# Patient Record
Sex: Male | Born: 1960 | Race: White | Hispanic: No | Marital: Married | Smoking: Never smoker
Health system: Southern US, Community
[De-identification: ages and names within clinical notes are randomized; demographics above are authoritative.]

## PROBLEM LIST (undated history)

## (undated) DIAGNOSIS — Z9289 Personal history of other medical treatment: Secondary | ICD-10-CM

## (undated) DIAGNOSIS — I10 Essential (primary) hypertension: Secondary | ICD-10-CM

## (undated) DIAGNOSIS — K219 Gastro-esophageal reflux disease without esophagitis: Secondary | ICD-10-CM

## (undated) DIAGNOSIS — E785 Hyperlipidemia, unspecified: Secondary | ICD-10-CM

## (undated) DIAGNOSIS — R112 Nausea with vomiting, unspecified: Secondary | ICD-10-CM

## (undated) DIAGNOSIS — Z9889 Other specified postprocedural states: Secondary | ICD-10-CM

## (undated) DIAGNOSIS — G4730 Sleep apnea, unspecified: Secondary | ICD-10-CM

## (undated) DIAGNOSIS — N189 Chronic kidney disease, unspecified: Secondary | ICD-10-CM

## (undated) DIAGNOSIS — E119 Type 2 diabetes mellitus without complications: Secondary | ICD-10-CM

## (undated) HISTORY — PX: VARICOCELE EXCISION: SUR582

## (undated) HISTORY — DX: Hyperlipidemia, unspecified: E78.5

## (undated) HISTORY — PX: APPENDECTOMY: SHX54

## (undated) HISTORY — PX: COLONOSCOPY: SHX174

## (undated) HISTORY — PX: WISDOM TOOTH EXTRACTION: SHX21

## (undated) HISTORY — DX: Gastro-esophageal reflux disease without esophagitis: K21.9

## (undated) HISTORY — DX: Type 2 diabetes mellitus without complications: E11.9

## (undated) HISTORY — DX: Sleep apnea, unspecified: G47.30

## (undated) HISTORY — PX: NASAL SEPTUM SURGERY: SHX37

## (undated) HISTORY — DX: Other specified postprocedural states: R11.2

## (undated) HISTORY — DX: Chronic kidney disease, unspecified: N18.9

## (undated) HISTORY — DX: Other specified postprocedural states: Z98.890

## (undated) HISTORY — DX: Personal history of other medical treatment: Z92.89

## (undated) HISTORY — DX: Essential (primary) hypertension: I10

---

## 1998-05-03 ENCOUNTER — Ambulatory Visit (HOSPITAL_COMMUNITY): Admission: RE | Admit: 1998-05-03 | Discharge: 1998-05-03 | Payer: Self-pay | Admitting: *Deleted

## 2003-01-03 ENCOUNTER — Encounter: Admission: RE | Admit: 2003-01-03 | Discharge: 2003-01-03 | Payer: Self-pay | Admitting: Internal Medicine

## 2003-01-03 ENCOUNTER — Encounter: Payer: Self-pay | Admitting: Internal Medicine

## 2004-07-05 ENCOUNTER — Ambulatory Visit (HOSPITAL_BASED_OUTPATIENT_CLINIC_OR_DEPARTMENT_OTHER): Admission: RE | Admit: 2004-07-05 | Discharge: 2004-07-05 | Payer: Self-pay | Admitting: Internal Medicine

## 2004-12-14 ENCOUNTER — Ambulatory Visit: Payer: Self-pay | Admitting: Internal Medicine

## 2005-12-12 ENCOUNTER — Ambulatory Visit: Payer: Self-pay | Admitting: Internal Medicine

## 2008-11-03 ENCOUNTER — Ambulatory Visit: Payer: Self-pay | Admitting: Internal Medicine

## 2009-05-02 ENCOUNTER — Ambulatory Visit: Payer: Self-pay | Admitting: Internal Medicine

## 2009-08-08 ENCOUNTER — Ambulatory Visit: Payer: Self-pay | Admitting: Internal Medicine

## 2009-10-31 ENCOUNTER — Ambulatory Visit: Payer: Self-pay | Admitting: Internal Medicine

## 2010-05-03 ENCOUNTER — Ambulatory Visit: Payer: Self-pay | Admitting: Internal Medicine

## 2011-01-11 NOTE — Procedures (Signed)
NAME:  Lance Beard, Lance Beard                 ACCOUNT NO.:  [id number]   MEDICAL RECORD NO.:  [id number]          PATIENT TYPE:  OUT   LOCATION:  SLEEP CENTER                 FACILITY:  [facility]   PHYSICIAN:  Clinton D. Maple Hudson, M.D. DATE OF BIRTH:  05/12/1961   DATE OF STUDY:  07/05/2004                              NOCTURNAL POLYSOMNOGRAM   REFERRING PHYSICIAN:  Clinton D. Maple Hudson, M.D.   INDICATION FOR STUDY AND HISTORY:  Hypersomnia with sleep apnea.   Epworth sleepiness score 7/24, neck size 16.5 inches, BMI 30, weight 235  pounds.   SLEEP ARCHITECTURE:  Total sleep time 357 minutes with sleep efficiency of  79%.  Stage I was 27%; stage II, 57%; Stages III and IV 6%.  REM was 11% of  total sleep time.  Latency to sleep onset 10 minutes, latency to REM 161  minutes.  Awake after sleep onset 80 minutes.  Arousal index elevated at  66.4.   RESPIRATORY DATA:  Split study protocol.  RDI 18.5 per hour indicating  moderate obstructive sleep apnea/hypopnea syndrome before CPAP.  This  included 22 obstructive apneas and 38 hypopneas before CPAP.  Events were  not positional.  REM RDI 18.9.  CPAP was titrated to 11 CWP, RDI 0 per hour  using a medium ResMed Ultra Mirage nasal/oral mask with heated humidifier.   OXYGEN DATA:  Very loud snoring with oxygen desaturation to a nadir of 85%  before CPAP.  After CPAP titration, saturation held 97% on room air.   CARDIAC DATA:  Normal sinus rhythm.   MOVEMENT AND PARASOMNIA:  A total of 70 limb jerks were recorded of which 15  were associated with arousal or awakening for a periodic limb movement with  arousal index of 2.5 per hour which is slightly increased.   IMPRESSION AND RECOMMENDATIONS:  1.  Moderate obstructive sleep apnea/hypopnea syndrome, RDI 18.5 per hour      with desaturation to 85%.  2.  CPAP titration to 11 CWP, RDI 0 per hour, using a medium  ResMed Ultra      Mirage nasal-oral mask with heated humidifier.  3.  Periodic limb movement with  arousal.   Clinton D. Maple Hudson, M.D.  Diplomate, Biomedical engineer of Sleep Medicine                                                           Clinton D. Maple Hudson, M.D.  Diplomate, American Board   CDY/MEDQ  D:  07/15/2004 12:24:30  T:  07/15/2004 16:57:37  Job:  [id number]

## 2011-05-20 ENCOUNTER — Encounter: Payer: Self-pay | Admitting: Internal Medicine

## 2011-05-21 ENCOUNTER — Other Ambulatory Visit: Payer: 59 | Admitting: Internal Medicine

## 2011-05-21 DIAGNOSIS — Z0000 Encounter for general adult medical examination without abnormal findings: Secondary | ICD-10-CM

## 2011-05-21 LAB — BASIC METABOLIC PANEL
BUN: 16 mg/dL (ref 6–23)
Calcium: 9.1 mg/dL (ref 8.4–10.5)
Creat: 1.11 mg/dL (ref 0.50–1.35)
Glucose, Bld: 93 mg/dL (ref 70–99)
Potassium: 4.7 mEq/L (ref 3.5–5.3)

## 2011-05-21 LAB — HEPATIC FUNCTION PANEL
ALT: 23 U/L (ref 0–53)
AST: 17 U/L (ref 0–37)
Alkaline Phosphatase: 28 U/L — ABNORMAL LOW (ref 39–117)
Bilirubin, Direct: 0.2 mg/dL (ref 0.0–0.3)
Total Bilirubin: 0.8 mg/dL (ref 0.3–1.2)

## 2011-05-21 LAB — CBC WITH DIFFERENTIAL/PLATELET
Basophils Absolute: 0 10*3/uL (ref 0.0–0.1)
Basophils Relative: 0 % (ref 0–1)
Eosinophils Absolute: 0.1 10*3/uL (ref 0.0–0.7)
Eosinophils Relative: 1 % (ref 0–5)
HCT: 44.9 % (ref 39.0–52.0)
Hemoglobin: 14.5 g/dL (ref 13.0–17.0)
Lymphocytes Relative: 32 % (ref 12–46)
MCH: 27.3 pg (ref 26.0–34.0)
MCHC: 32.3 g/dL (ref 30.0–36.0)
MCV: 84.4 fL (ref 78.0–100.0)
Monocytes Absolute: 0.5 10*3/uL (ref 0.1–1.0)
Monocytes Relative: 7 % (ref 3–12)
Platelets: 218 10*3/uL (ref 150–400)
RDW: 15.3 % (ref 11.5–15.5)
WBC: 6.2 10*3/uL (ref 4.0–10.5)

## 2011-05-21 LAB — LIPID PANEL: Cholesterol: 103 mg/dL (ref 0–200)

## 2011-05-23 ENCOUNTER — Ambulatory Visit (INDEPENDENT_AMBULATORY_CARE_PROVIDER_SITE_OTHER): Payer: 59 | Admitting: Internal Medicine

## 2011-05-23 ENCOUNTER — Encounter: Payer: Self-pay | Admitting: Internal Medicine

## 2011-05-23 VITALS — BP 148/78 | HR 80 | Temp 98.8°F | Ht 73.5 in | Wt 254.0 lb

## 2011-05-23 DIAGNOSIS — Z0000 Encounter for general adult medical examination without abnormal findings: Secondary | ICD-10-CM

## 2011-05-23 DIAGNOSIS — E781 Pure hyperglyceridemia: Secondary | ICD-10-CM

## 2011-05-23 LAB — POCT URINALYSIS DIPSTICK
Ketones, UA: NEGATIVE
Leukocytes, UA: NEGATIVE
Protein, UA: NEGATIVE
Urobilinogen, UA: NEGATIVE
pH, UA: 6

## 2011-05-23 NOTE — Progress Notes (Signed)
  Subjective:    Patient ID: Lance Beard, male    DOB: [birth year], 50 y.o.   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 History of hypertriglyceridemia. Is on Lipitor 10 mg daily. History of mild obstructive sleep apnea and his CPAP machine. Fractured nose in the remote past playing sports 1981. No known drug allergies. Appendectomy 1981, surgery for left varicocele 1991, deviated nasal septum repair 1984. Tetanus immunization October 2003.  His adopted daughter went off to college recently. 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 His father had a stroke in his been quite deal. At one point it was thought father who has multiple medical problems might not live but he improved in his family at home but this is been very stressful for Lance Beard. His blood pressure is elevated today. Nonsmoker. No history of issues with blood pressure in the past.  Father with history of coronary artery disease, chronic kidney disease, CVA, diabetes. One sister in good health.   Patient is married. Wife is a Warden/ranger in the public school system. Patient does not smoke or consume alcohol. One adopted daughter.    Review of Systems  Constitutional: Negative.   HENT: Negative.   Eyes: Negative.   Respiratory: Negative.   Cardiovascular: Negative.   Gastrointestinal: Negative.   Genitourinary: Negative.   Musculoskeletal: Negative.   Neurological: Negative.   Hematological: Negative.   Psychiatric/Behavioral: Negative.        Objective:   Physical Exam  Vitals reviewed. Constitutional: He is oriented to person, place, and time. No distress.  HENT:  Head: Normocephalic and atraumatic.  Right Ear: External ear normal.  Left Ear: External ear normal.  Mouth/Throat: Oropharynx is clear and moist.  Eyes: Conjunctivae and EOM are normal. Pupils are equal, round, and reactive to light.  Neck: Normal range of  motion. Neck supple. No JVD present. No thyromegaly present.  Cardiovascular: Normal rate, regular rhythm, normal heart sounds and intact distal pulses.   No murmur heard. Pulmonary/Chest: Effort normal and breath sounds normal. He has no wheezes.  Abdominal: Soft. Bowel sounds are normal. He exhibits no distension and no mass. There is no rebound and no guarding.  Genitourinary: Prostate normal.  Musculoskeletal: Normal range of motion. He exhibits no edema.  Lymphadenopathy:    He has no cervical adenopathy.  Neurological: He is alert and oriented to person, place, and time. He has normal reflexes. No cranial nerve deficit. Coordination normal.  Skin: Skin is warm and dry. He is not diaphoretic.  Psychiatric: He has a normal mood and affect.          Assessment & Plan:  History of hypertriglyceridemia maintained on Lipitor 10 mg daily. Recent lipid panel shows triglycerides of 190 and total cholesterol of 100 for  In September 2011 triglycerides were 180. Prior to starting medication triglycerides were 320 in August 2003. Has been on Lipitor since December 2004.  Suspect labile hypertension. Family history of hypertension in his father along with cardiovascular disease. Would like for patient to return and have repeat blood pressure check in 6-8 weeks. [facility] about every 8 weeks. Offered home blood pressure monitor prescription but he declines at this point in time. I rechecked his blood pressure today and got 140/80. Watch salt, try to exercise and reassess in 8 weeks

## 2011-07-16 ENCOUNTER — Other Ambulatory Visit: Payer: Self-pay | Admitting: Internal Medicine

## 2011-07-17 ENCOUNTER — Other Ambulatory Visit: Payer: Self-pay | Admitting: Internal Medicine

## 2011-07-25 ENCOUNTER — Encounter: Payer: Self-pay | Admitting: Internal Medicine

## 2011-07-25 ENCOUNTER — Ambulatory Visit (INDEPENDENT_AMBULATORY_CARE_PROVIDER_SITE_OTHER): Payer: 59 | Admitting: Internal Medicine

## 2011-07-25 VITALS — BP 140/82 | HR 84 | Temp 98.3°F | Wt 258.0 lb

## 2011-07-25 DIAGNOSIS — R030 Elevated blood-pressure reading, without diagnosis of hypertension: Secondary | ICD-10-CM

## 2011-07-25 DIAGNOSIS — E781 Pure hyperglyceridemia: Secondary | ICD-10-CM

## 2011-07-25 NOTE — Patient Instructions (Signed)
Try walking for exercise. Watch salt intake. Recheck March 2013 with fasting lipid panel liver functions blood pressure check and office visit

## 2011-07-25 NOTE — Progress Notes (Signed)
  Subjective:    Patient ID: Lance Beard, male    DOB: 1960/12/09, 50 y.o.   MRN: [id number]  HPI 50 year old white male with history of hypertriglyceridemia on Lipitor with blood pressure elevation noted on physical exam September 2012. Blood pressure today initially was elevated. He came in from work and was a bit rushed. When I rechecked it I got 140/90 with large cuff right arm and 130/80 left arm large cuff. He has not been able to check it at home and has not donated blood recently where he would have it checked. We will continue to watch it but with family history in his father hypertension and coronary artery disease, it is likely that he will wind up on antihypertensive medication. He has an appointment for six-month recheck March 2013 and we will followup at that time.  Of note, his blood pressure should always be checked with a large cuff because he has fairly large muscular arm.    Review of Systems     Objective:   Physical Exam chest clear; cardiac exam regular rate and rhythm        Assessment & Plan:  Borderline blood pressure elevation Hypertriglyceridemia  Plan: Return March 2013 for fasting lipid panel liver functions blood pressure check and office visit. He did get influenza immunization.

## 2011-11-18 ENCOUNTER — Ambulatory Visit (INDEPENDENT_AMBULATORY_CARE_PROVIDER_SITE_OTHER): Payer: 59 | Admitting: Internal Medicine

## 2011-11-18 ENCOUNTER — Encounter: Payer: Self-pay | Admitting: Internal Medicine

## 2011-11-18 VITALS — BP 124/76 | HR 76 | Temp 98.2°F | Ht 73.75 in | Wt 260.0 lb

## 2011-11-18 DIAGNOSIS — Z0000 Encounter for general adult medical examination without abnormal findings: Secondary | ICD-10-CM

## 2011-11-18 DIAGNOSIS — E785 Hyperlipidemia, unspecified: Secondary | ICD-10-CM

## 2011-11-18 DIAGNOSIS — Z23 Encounter for immunization: Secondary | ICD-10-CM

## 2011-11-18 DIAGNOSIS — Z8042 Family history of malignant neoplasm of prostate: Secondary | ICD-10-CM

## 2011-11-18 LAB — CBC WITH DIFFERENTIAL/PLATELET
Basophils Relative: 0 % (ref 0–1)
Eosinophils Absolute: 0.1 10*3/uL (ref 0.0–0.7)
HCT: 48.1 % (ref 39.0–52.0)
Hemoglobin: 15.3 g/dL (ref 13.0–17.0)
MCH: 27.4 pg (ref 26.0–34.0)
MCHC: 31.8 g/dL (ref 30.0–36.0)
Monocytes Absolute: 0.5 10*3/uL (ref 0.1–1.0)
Monocytes Relative: 7 % (ref 3–12)

## 2011-11-18 LAB — LIPID PANEL
HDL: 25 mg/dL — ABNORMAL LOW (ref >39)
LDL Cholesterol: 42 mg/dL (ref 0–99)
Triglycerides: 162 mg/dL — ABNORMAL HIGH (ref <150)
VLDL: 32 mg/dL (ref 0–40)

## 2011-11-18 LAB — COMPREHENSIVE METABOLIC PANEL
Albumin: 4.4 g/dL (ref 3.5–5.2)
Alkaline Phosphatase: 30 U/L — ABNORMAL LOW (ref 39–117)
BUN: 12 mg/dL (ref 6–23)
Glucose, Bld: 88 mg/dL (ref 70–99)
Total Bilirubin: 0.7 mg/dL (ref 0.3–1.2)

## 2011-11-18 LAB — POCT URINALYSIS DIPSTICK
Blood, UA: NEGATIVE
Glucose, UA: NEGATIVE
Ketones, UA: NEGATIVE
Spec Grav, UA: 1.01
Urobilinogen, UA: NEGATIVE

## 2011-11-22 ENCOUNTER — Encounter: Payer: Self-pay | Admitting: Internal Medicine

## 2011-11-22 NOTE — Progress Notes (Signed)
  Subjective:    Patient ID: Lance Beard, male    DOB: 04-Apr-1961, 51 y.o.   MRN: [id number]  HPI 51 year old white male with hyperlipidemia in today for health maintenance exam. Family history of coronary artery disease in his father. EKG performed today is within normal limits.    Review of Systems     Objective:   Physical Examneck no JVD or thyromegaly; chest clear to auscultation; cardiac exam: regular rate and rhythm normal S1 and S2; abdomen: no hepatosplenomegaly masses or tenderness. No bruits. Extremities without edema. Pulses in feet are normal.prostate normal.        Assessment & Plan:  Hyperlipidemia  Family history of coronary artery disease in his father  Plan: Return in 6 months for fasting lipid panel liver functions and office visit.

## 2011-11-22 NOTE — Patient Instructions (Signed)
Continue Lipitor as prescribed. Return in 6 months.

## 2012-05-21 ENCOUNTER — Other Ambulatory Visit: Payer: 59 | Admitting: Internal Medicine

## 2012-05-21 DIAGNOSIS — E785 Hyperlipidemia, unspecified: Secondary | ICD-10-CM

## 2012-05-21 DIAGNOSIS — Z79899 Other long term (current) drug therapy: Secondary | ICD-10-CM

## 2012-05-21 LAB — LIPID PANEL
HDL: 23 mg/dL — ABNORMAL LOW (ref >39)
LDL Cholesterol: 30 mg/dL (ref 0–99)
Total CHOL/HDL Ratio: 4.2 Ratio
Triglycerides: 214 mg/dL — ABNORMAL HIGH (ref <150)
VLDL: 43 mg/dL — ABNORMAL HIGH (ref 0–40)

## 2012-05-21 LAB — HEPATIC FUNCTION PANEL
Albumin: 4.2 g/dL (ref 3.5–5.2)
Indirect Bilirubin: 0.5 mg/dL (ref 0.0–0.9)
Total Protein: 6.4 g/dL (ref 6.0–8.3)

## 2012-05-25 ENCOUNTER — Ambulatory Visit: Payer: 59 | Admitting: Internal Medicine

## 2012-05-28 ENCOUNTER — Encounter: Payer: Self-pay | Admitting: Internal Medicine

## 2012-05-28 ENCOUNTER — Ambulatory Visit (INDEPENDENT_AMBULATORY_CARE_PROVIDER_SITE_OTHER): Payer: 59 | Admitting: Internal Medicine

## 2012-05-28 VITALS — BP 128/68 | HR 76 | Temp 98.0°F | Wt 262.0 lb

## 2012-05-28 DIAGNOSIS — E781 Pure hyperglyceridemia: Secondary | ICD-10-CM

## 2012-05-28 NOTE — Patient Instructions (Addendum)
Increase Lipitor from 10-20 mg daily return in 6 months.

## 2012-05-28 NOTE — Progress Notes (Signed)
  Subjective:    Patient ID: Lance Beard, male    DOB: Jun 16, 1961, 51 y.o.   MRN: [id number]  HPI 51 year old White male for follow up of hyperlipidemia. History of hypertriglyceridemia maintained on Lipitor 10 mg daily. Currently taking one half of a 20 mg tablet daily. He is compliant with his medication. However, we have seen an increase in triglycerides from last visit from the 160 range to 214. Says he doesn't always make good food choices but tries to eat healthy.  Says he will get influenza immunization at work.  We have discussed colonoscopy previously and he has declined having it so far.  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 He takes Advil for shoulder pain. 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    Review of Systems     Objective:   Physical Exam chest clear to auscultation. Cardiac exam regular rate and rhythm. Extremities without edema.        Assessment & Plan:  Hypertriglyceridemia  Plan: Increase Lipitor from 10-20 mg daily. New prescription for generic Lipitor 20 mg (#90) one by mouth daily with when necessary 1 year refill. Schedule physical examination in 6 months with fasting lab work.

## 2012-09-11 ENCOUNTER — Ambulatory Visit (INDEPENDENT_AMBULATORY_CARE_PROVIDER_SITE_OTHER): Payer: 59 | Admitting: Internal Medicine

## 2012-09-11 ENCOUNTER — Encounter: Payer: Self-pay | Admitting: Internal Medicine

## 2012-09-11 ENCOUNTER — Ambulatory Visit
Admission: RE | Admit: 2012-09-11 | Discharge: 2012-09-11 | Disposition: A | Discharge status: Home / Self Care | Payer: 59 | Source: Ambulatory Visit | Attending: Internal Medicine | Admitting: Internal Medicine

## 2012-09-11 VITALS — BP 128/76 | HR 76 | Temp 98.3°F | Wt 265.0 lb

## 2012-09-11 DIAGNOSIS — R0602 Shortness of breath: Secondary | ICD-10-CM

## 2012-09-11 NOTE — Patient Instructions (Addendum)
   We will arrange for cardiology consultation. Go to the ER if symptoms worsen

## 2012-09-11 NOTE — Progress Notes (Addendum)
  Subjective:    Patient ID: Lance Beard, male    DOB: 19-Oct-1960, 52 y.o.   MRN: [id number]  HPI 52 year old white male who has strong family history of coronary artery disease in his father has developed some discomfort in his left arm. He recently went to the [employer] to donate blood in his blood pressure was elevated that day. No prior history of hypertension. No frank chest pain-- just some left arm pain. Doesn't recall any overexertion that would have resulted in strain of his left arm. Says he maybe overreacting to this symptom. History of hyperlipidemia on statin therapy. Nonsmoker.    Review of Systems     Objective:   Physical Exam Skin is warm and dry. Nodes none. Neck is supple without thyromegaly JVD or carotid bruits. Chest clear to auscultation. Cardiac exam: regular rate and rhythm normal S1 and S2 without murmurs. Extremities without edema. EKG shows no acute changes. Normal sinus rhythm.        Assessment & Plan:   Left arm pain in 52 year old male with hyperlipidemia with strong family history of coronary artery disease  Isolated episode of elevated blood pressure at [employer]  Plan: Patient seems anxious about these symptoms so we'll refer to cardiology for further evaluation.   Chest x-ray ordered

## 2012-09-17 ENCOUNTER — Telehealth: Payer: Self-pay

## 2012-09-17 DIAGNOSIS — M79603 Pain in arm, unspecified: Secondary | ICD-10-CM

## 2012-09-17 NOTE — Telephone Encounter (Signed)
Patient scheduled for an appointment with Dr. Ellin Goodie cardiology  On 01/24/12014 at 9:00 am. He is aware of this appt.

## 2012-09-18 ENCOUNTER — Encounter: Payer: Self-pay | Admitting: Cardiology

## 2012-09-18 ENCOUNTER — Ambulatory Visit (INDEPENDENT_AMBULATORY_CARE_PROVIDER_SITE_OTHER): Payer: 59 | Admitting: Cardiology

## 2012-09-18 VITALS — BP 140/76 | HR 87 | Ht 74.0 in | Wt 264.0 lb

## 2012-09-18 DIAGNOSIS — R079 Chest pain, unspecified: Secondary | ICD-10-CM

## 2012-09-18 DIAGNOSIS — E781 Pure hyperglyceridemia: Secondary | ICD-10-CM

## 2012-09-18 NOTE — Progress Notes (Signed)
  HPI: 52 year old male for evaluation of chest pain. No prior cardiac history. Chest x-ray 09/11/2012 showed mild peribronchial thickening. Patient notes occasional left shoulder and left upper extremity discomfort. It is not exertional. No associated symptoms. Occurs when "he thinks about it". He has dyspnea with more extreme activities but not routine activities. No orthopnea, PND, pedal edema or syncope. No claudication. Because of his symptoms we were asked to evaluate.  Current Outpatient Prescriptions  Medication Sig Dispense Refill  . aspirin 81 MG tablet Take 81 mg by mouth daily.        Marland Kitchen atorvastatin (LIPITOR) 20 MG tablet         No Known Allergies  Past Medical History  Diagnosis Date  . Hyperlipidemia   . Sleep apnea     Past Surgical History  Procedure Date  . Appendectomy   . Varicocele excision   . Nasal septum surgery     History   Social History  . Marital Status: Married    Spouse Name: N/A    Number of Children: 1  . Years of Education: N/A   Occupational History  .      Accountant   Social History Main Topics  . Smoking status: Never Smoker   . Smokeless tobacco: Never Used  . Alcohol Use: No  . Drug Use: No  . Sexually Active: Not on file   Other Topics Concern  . Not on file   Social History Narrative  . No narrative on file    Family History  Problem Relation Age of Onset  . Diabetes Father   . Hypertension Father   . CAD Father     Approximate age 69     ROS: no fevers or chills, productive cough, hemoptysis, dysphasia, odynophagia, melena, hematochezia, dysuria, hematuria, rash, seizure activity, orthopnea, PND, pedal edema, claudication. Remaining systems are negative.  Physical Exam:   Blood pressure 140/76, pulse 87, height 6\' 2"  (1.88 m), weight 264 lb (119.75 kg).  General:  Well developed/well nourished in NAD Skin warm/dry Patient not depressed No peripheral clubbing Back-normal HEENT-normal/normal eyelids Neck  supple/normal carotid upstroke bilaterally; no bruits; no JVD; no thyromegaly chest - CTA/ normal expansion CV - RRR/normal S1 and S2; no murmurs, rubs or gallops;  PMI nondisplaced Abdomen -NT/ND, no HSM, no mass, + bowel sounds, no bruit 2+ femoral pulses, no bruits Ext-no edema, chords, 2+ DP Neuro-grossly nonfocal  ECG sinus rhythm at a rate of 87. First degree AV block. No ST changes.

## 2012-09-18 NOTE — Assessment & Plan Note (Signed)
Continue statin. 

## 2012-09-18 NOTE — Assessment & Plan Note (Signed)
Plan exercise treadmill. Symptoms are atypical. If negative would not pursue further cardiac evaluation. I have instructed him on the importance of diet and exercise.

## 2012-09-18 NOTE — Patient Instructions (Addendum)
Your physician recommends that you schedule a follow-up appointment in: AS NEEDED PENDING TEST RESULTS  Your physician has requested that you have an exercise tolerance test. For further information please visit [email or web address]. Please also follow instruction sheet, as given.     Exercise Stress Electrocardiography An exercise stress test is a heart test (EKG) which is done while you are moving. You will walk on a treadmill. This test will tell your doctor how your heart does when it is forced to work harder and how much activity you can safely handle. BEFORE THE TEST  Wear shorts or athletic pants.  Wear comfortable tennis shoes.  Women need to wear a bra that allows patches to be put on under it. TEST  An EKG cable will be attached to your waist. This cable is hooked up to patches, which look like round stickers stuck to your chest.  You will be asked to walk on the treadmill.  You will walk until you are too tired or until you are told to stop.  Tell the doctor right away if you have:  Chest pain.  Leg cramps.  Shortness of breath.  Dizziness.  The test may last 30 minutes to 1 hour. The timing depends on your physical condition and the condition of your heart. AFTER THE TEST  You will rest for about 6 minutes. During this time, your heart rhythm and blood pressure will be checked.  The testing equipment will be removed from your body and you can get dressed.  You may go home or back to your hospital room. You may keep doing all your usual activities as told by your doctor. Finding out the results of your test Ask when your test results will be ready. Make sure you get your test results. Document Released: 01/29/2008 Document Revised: 11/04/2011 Document Reviewed: 01/29/2008 Exit[employer] Patient Information 2013 [employer].   

## 2012-10-08 ENCOUNTER — Encounter: Payer: 59 | Admitting: Physician Assistant

## 2012-10-19 ENCOUNTER — Ambulatory Visit (INDEPENDENT_AMBULATORY_CARE_PROVIDER_SITE_OTHER): Payer: 59 | Admitting: Physician Assistant

## 2012-10-19 DIAGNOSIS — R9439 Abnormal result of other cardiovascular function study: Secondary | ICD-10-CM

## 2012-10-19 DIAGNOSIS — R079 Chest pain, unspecified: Secondary | ICD-10-CM

## 2012-10-19 NOTE — Patient Instructions (Addendum)
Your physician has requested that you have en exercise stress myoview. For further information please visit [email or web address]. Please follow instruction sheet, as given.   

## 2012-10-19 NOTE — Procedures (Signed)
Exercise Treadmill Test  Pre-Exercise Testing Evaluation Rhythm: normal sinus  Rate: 94     Test  Exercise Tolerance Test Ordering MD: Olga Millers, MD  Interpreting MD: Tereso Newcomer PA-C  Unique Test No: 1 Treadmill:  1  Indication for ETT: chest pain - rule out ischemia  Contraindication to ETT: No   Stress Modality: exercise - treadmill  Cardiac Imaging Performed: non   Protocol: standard Bruce - maximal  Max BP:  208/52  Max MPHR (bpm):  170 85% MPR (bpm):  145  MPHR obtained (bpm):  162 % MPHR obtained:  95%  Reached 85% MPHR (min:sec):  5:20 Total Exercise Time (min-sec):  8:00  Workload in METS:  10 Borg Scale: 15  Reason ETT Terminated:  patient's desire to stop    ST Segment Analysis At Rest: normal ST segments - no evidence of significant ST depression With Exercise: borderline ST changes  Other Information Arrhythmia:  No Angina during ETT:  absent (0) Quality of ETT:  indeterminate  ETT Interpretation:  borderline (indeterminate) with non-specific ST changes  Comments: Fair exercise tolerance. No chest pain. Normal BP response to exercise. Borderline ST segment depression in inf-lat leads at peak stress.  Cannot rule out ischemia.  Recommendations: Patient will be scheduled for ETT-myoview. Luna Glasgow, PA-C  11:34 AM 10/19/2012

## 2012-10-28 ENCOUNTER — Ambulatory Visit (HOSPITAL_COMMUNITY): Payer: 59 | Attending: Cardiology | Admitting: Radiology

## 2012-10-28 VITALS — BP 143/74 | Ht 74.0 in | Wt 258.0 lb

## 2012-10-28 DIAGNOSIS — R0609 Other forms of dyspnea: Secondary | ICD-10-CM | POA: Insufficient documentation

## 2012-10-28 DIAGNOSIS — R9439 Abnormal result of other cardiovascular function study: Secondary | ICD-10-CM

## 2012-10-28 DIAGNOSIS — R079 Chest pain, unspecified: Secondary | ICD-10-CM

## 2012-10-28 DIAGNOSIS — R0989 Other specified symptoms and signs involving the circulatory and respiratory systems: Secondary | ICD-10-CM | POA: Insufficient documentation

## 2012-10-28 MED ORDER — TECHNETIUM TC 99M SESTAMIBI GENERIC - CARDIOLITE
10.0000 | Freq: Once | INTRAVENOUS | Status: AC | PRN
  Administered 2012-10-28: 10 via INTRAVENOUS

## 2012-10-28 MED ORDER — TECHNETIUM TC 99M SESTAMIBI GENERIC - CARDIOLITE
30.0000 | Freq: Once | INTRAVENOUS | Status: AC | PRN
  Administered 2012-10-28: 30 via INTRAVENOUS

## 2012-10-28 NOTE — Progress Notes (Signed)
[facility] SITE 3 NUCLEAR MED [address] [place], [place] [address] [phone]    Cardiology Nuclear Med Study  Lance Beard is a 52 y.o. male     MRN : [id number]     DOB: July 10, 1961  Procedure Date: 10/28/2012  Nuclear Med Background Indication for Stress Test:  Evaluation for Ischemia and Abnormal EKG-2/14 Abnormal GXT History:  7/8 yrs ago MPS: Nl per pt, 10/19/2012 GXT: Borderline/indeterminate with non specific changes in inferolateral leads at peak stress Cardiac Risk Factors: Family History - CAD and Lipids  Symptoms:  Chest Pain and DOE   Nuclear Pre-Procedure Caffeine/Decaff Intake:  None NPO After: 6:30   Lungs:  clear O2 Sat: 98-100% on room air. IV 0.9% NS with Angio Cath:  20g  IV Site: R Antecubital  IV Started by:  Bonnita Levan, RN  Chest Size (in):  46 Cup Size: n/a  Height: 6\' 2"  (1.88 m)  Weight:  258 lb (117.028 kg)  BMI:  Body mass index is 33.11 kg/(m^2). Tech Comments:  N/A    Nuclear Med Study 1 or 2 day study: 1 day  Stress Test Type:  Stress  Reading MD: Marca Ancona, MD  Order Authorizing Provider:  Olga Millers, MD  Resting Radionuclide: Technetium 34m Sestamibi  Resting Radionuclide Dose: 11.0 mCi   Stress Radionuclide:  Technetium 53m Sestamibi  Stress Radionuclide Dose: 33.0 mCi           Stress Protocol Rest HR: 69 Stress HR: 155  Rest BP: 143/74 Stress BP: 207/68  Exercise Time (min): 7:35 METS: 9.4   Predicted Max HR: 170 bpm % Max HR: 91.18 bpm Rate Pressure Product: 46962   Dose of Adenosine (mg):  n/a Dose of Lexiscan: n/a mg  Dose of Atropine (mg): n/a Dose of Dobutamine: n/a mcg/kg/min (at max HR)  Stress Test Technologist: Milana Na, EMT-P  Nuclear Technologist:  Domenic Polite, CNMT     Rest Procedure:  Myocardial perfusion imaging was performed at rest 45 minutes following the intravenous administration of Technetium 57m Sestamibi. Rest ECG: NSR - Normal EKG  Stress Procedure:  The patient  exercised on the treadmill utilizing the Bruce Protocol for 7:35 minutes. The patient stopped due to fatigue and denied any chest pain.  Technetium 68m Sestamibi was injected at peak exercise and myocardial perfusion imaging was performed after a brief delay. Stress ECG: No significant change from baseline ECG  QPS Raw Data Images:  Normal; no motion artifact; normal heart/lung ratio. Stress Images:  Normal homogeneous uptake in all areas of the myocardium. Rest Images:  Normal homogeneous uptake in all areas of the myocardium. Subtraction (SDS):  There is no evidence of scar or ischemia. Transient Ischemic Dilatation (Normal <1.22):  1.01 Lung/Heart Ratio (Normal <0.45):  0.32  Quantitative Gated Spect Images QGS EDV:  132 ml QGS ESV:  50 ml  Impression Exercise Capacity:  Fair exercise capacity. BP Response:  Hypertensive blood pressure response. Clinical Symptoms:  Fatigue ECG Impression:  No significant ST segment change suggestive of ischemia. Comparison with Prior Nuclear Study: No images to compare  Overall Impression:  Normal stress nuclear study.  LV Ejection Fraction: 62%.  LV Wall Motion:  NL LV Function; NL Wall Motion  Marca Ancona 10/28/2012

## 2012-10-30 ENCOUNTER — Telehealth: Payer: Self-pay | Admitting: *Deleted

## 2012-10-30 ENCOUNTER — Encounter: Payer: Self-pay | Admitting: Physician Assistant

## 2012-10-30 NOTE — Telephone Encounter (Signed)
Message copied by Tarri Fuller on Fri Oct 30, 2012 11:39 AM ------      Message from: [place], [place] T      Created: Fri Oct 30, 2012 11:04 AM       Please inform patient stress test normal.      Tereso Newcomer, PA-C  11:04 AM 10/30/2012 ------

## 2012-10-30 NOTE — Telephone Encounter (Signed)
pt's wife notified about normal myoview, she gave verbal understanding today

## 2012-12-01 ENCOUNTER — Other Ambulatory Visit: Payer: 59 | Admitting: Internal Medicine

## 2012-12-01 DIAGNOSIS — Z0000 Encounter for general adult medical examination without abnormal findings: Secondary | ICD-10-CM

## 2012-12-01 DIAGNOSIS — E785 Hyperlipidemia, unspecified: Secondary | ICD-10-CM

## 2012-12-01 LAB — LIPID PANEL
Cholesterol: 126 mg/dL (ref 0–200)
Total CHOL/HDL Ratio: 3.4 Ratio
Triglycerides: 77 mg/dL (ref <150)
VLDL: 15 mg/dL (ref 0–40)

## 2012-12-01 LAB — CBC WITH DIFFERENTIAL/PLATELET
Eosinophils Absolute: 0 10*3/uL (ref 0.0–0.7)
Eosinophils Relative: 1 % (ref 0–5)
Hemoglobin: 16.1 g/dL (ref 13.0–17.0)
Lymphs Abs: 1.8 10*3/uL (ref 0.7–4.0)
MCH: 28.9 pg (ref 26.0–34.0)
MCV: 86.2 fL (ref 78.0–100.0)
Monocytes Absolute: 0.7 10*3/uL (ref 0.1–1.0)
Monocytes Relative: 8 % (ref 3–12)
Platelets: 216 10*3/uL (ref 150–400)
RBC: 5.58 MIL/uL (ref 4.22–5.81)

## 2012-12-01 LAB — COMPREHENSIVE METABOLIC PANEL
CO2: 30 mEq/L (ref 19–32)
Creat: 1.1 mg/dL (ref 0.50–1.35)
Glucose, Bld: 90 mg/dL (ref 70–99)
Total Bilirubin: 0.6 mg/dL (ref 0.3–1.2)

## 2012-12-03 ENCOUNTER — Ambulatory Visit (INDEPENDENT_AMBULATORY_CARE_PROVIDER_SITE_OTHER): Payer: 59 | Admitting: Internal Medicine

## 2012-12-03 ENCOUNTER — Encounter: Payer: Self-pay | Admitting: Internal Medicine

## 2012-12-03 VITALS — BP 148/80 | HR 88 | Temp 98.5°F | Ht 71.75 in | Wt 253.5 lb

## 2012-12-03 DIAGNOSIS — R030 Elevated blood-pressure reading, without diagnosis of hypertension: Secondary | ICD-10-CM

## 2012-12-03 DIAGNOSIS — IMO0001 Reserved for inherently not codable concepts without codable children: Secondary | ICD-10-CM

## 2012-12-03 DIAGNOSIS — Z0000 Encounter for general adult medical examination without abnormal findings: Secondary | ICD-10-CM

## 2012-12-03 DIAGNOSIS — R079 Chest pain, unspecified: Secondary | ICD-10-CM

## 2012-12-03 DIAGNOSIS — E785 Hyperlipidemia, unspecified: Secondary | ICD-10-CM

## 2012-12-03 LAB — POCT URINALYSIS DIPSTICK
Bilirubin, UA: NEGATIVE
Ketones, UA: NEGATIVE
Protein, UA: NEGATIVE
Spec Grav, UA: 1.01
pH, UA: 7.5

## 2012-12-07 ENCOUNTER — Other Ambulatory Visit: Payer: Self-pay | Admitting: Internal Medicine

## 2012-12-20 ENCOUNTER — Encounter: Payer: Self-pay | Admitting: Internal Medicine

## 2012-12-20 NOTE — Patient Instructions (Addendum)
Continue lipid-lowering medication. Watch blood pressure at home. Try to diet & exercise. Return in 6 months. If blood pressure remains elevated at home recontact me for medication.

## 2012-12-20 NOTE — Progress Notes (Signed)
Subjective:    Patient ID: Lance Beard, male    DOB: October 15, 1960, 52 y.o.   MRN: [id number]  HPI 52 year old white male in today for health maintenance and evaluation of medical problems. History of hyperlipidemia. In January had some nonspecific Chest pain and left arm pain evaluated by Dr. Jens Som.Had negative nuclear medicine study after having false positive stress test. His blood pressure is also elevated today. 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 He is under a fair amount of stress with that and attributes  his blood pressure elevation to that. 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 Has accounting degree from [employer].  Tetanus immunization 2013.  Social History: Married. Wife is retired from [employer] school system as a Warden/ranger. Adopted daughter attending [employer]. Nonsmoker. No alcohol consumption.  EKG today shows borderline first degree AV block. No change from EKG 11/18/2011.  Patient complaining of some vague chest pain, fatigue, left arm pain. Has been on Lipitor since 2004. History of mild obstructive sleep apnea.  No known drug allergies.  Fractured nose playing ball 1981. Appendectomy 1981. Surgery for left varicocele 1991. Surgery for nasal septal deviation 1984.  Cardiolite study done by Dr. Jens Som 2004 negative. Has seen Dr. Maple Hudson regarding obstructive sleep apnea with hypersomnia. This was in 2007 and CPAP was recommended at 12.  Family history: Mother in good health, sister in good health. Father with history of diabetes, hypertension, coronary artery disease, renal failure. See had a had a Pap smear in a couple of years      Review of Systems    Objective:   Physical Exam  Vitals reviewed. Constitutional: He is oriented to person, place, and time. He appears well-developed and well-nourished. No  distress.  HENT:  Head: Normocephalic and atraumatic.  Right Ear: External ear normal.  Left Ear: External ear normal.  Mouth/Throat: Oropharynx is clear and moist. No oropharyngeal exudate.  Eyes: Conjunctivae and EOM are normal. Pupils are equal, round, and reactive to light. Right eye exhibits no discharge. Left eye exhibits no discharge. No scleral icterus.  Neck: Neck supple. No JVD present. No thyromegaly present.  Cardiovascular: Normal rate, regular rhythm, normal heart sounds and intact distal pulses.   No murmur heard. Pulmonary/Chest: Effort normal and breath sounds normal. No respiratory distress. He has no wheezes. He has no rales. He exhibits no tenderness.  Abdominal: Soft. Bowel sounds are normal. He exhibits no distension and no mass. There is no tenderness. There is no rebound and no guarding.  Genitourinary: Prostate normal.  Musculoskeletal: Normal range of motion. He exhibits no edema and no tenderness.  Lymphadenopathy:    He has no cervical adenopathy.  Neurological: He is alert and oriented to person, place, and time. He has normal reflexes. He displays normal reflexes. No cranial nerve deficit. Coordination normal.  Skin: Skin is warm and dry. No rash noted. He is not diaphoretic.  Psychiatric: He has a normal mood and affect. His behavior is normal. Judgment and thought content normal.          Assessment & Plan:  Elevated blood pressure  Chest pain- evaluated January 2014 with negative nuclear medicine study  however patient has family history of coronary disease in his father. Patient also has hyperlipidemia. Today his blood pressure is elevated. He does not want to be on antihypertensive  medication at this point in time. He prefers to  have a trial of diet and exercise. He's currently in a race for [employer] and is under some stress campaigning almost nightly.  Saw Dr. Jens Som January 2014 plus had nuclear medicine study after having  abnormal stress test which in retrospect was false positive.  History of first degree AV Block shown initially on EKG 2008  Plan: Continue lipid-lowering medication and return in 6 months. Patient is to keep an eye on his blood pressure with home blood pressure monitor. My feeling is that he will eventually wind up on BP med.Screening Colonoscopy discussed.

## 2013-01-05 ENCOUNTER — Encounter: Payer: Self-pay | Admitting: Internal Medicine

## 2013-01-05 ENCOUNTER — Ambulatory Visit (INDEPENDENT_AMBULATORY_CARE_PROVIDER_SITE_OTHER): Payer: 59 | Admitting: Internal Medicine

## 2013-01-05 VITALS — BP 146/74 | HR 84 | Temp 98.5°F | Wt 252.0 lb

## 2013-01-05 DIAGNOSIS — L304 Erythema intertrigo: Secondary | ICD-10-CM

## 2013-01-05 DIAGNOSIS — R030 Elevated blood-pressure reading, without diagnosis of hypertension: Secondary | ICD-10-CM

## 2013-01-05 DIAGNOSIS — L538 Other specified erythematous conditions: Secondary | ICD-10-CM

## 2013-01-05 DIAGNOSIS — L739 Follicular disorder, unspecified: Secondary | ICD-10-CM

## 2013-01-05 DIAGNOSIS — L738 Other specified follicular disorders: Secondary | ICD-10-CM

## 2013-01-05 DIAGNOSIS — R21 Rash and other nonspecific skin eruption: Secondary | ICD-10-CM

## 2013-01-05 DIAGNOSIS — IMO0001 Reserved for inherently not codable concepts without codable children: Secondary | ICD-10-CM

## 2013-01-05 DIAGNOSIS — L259 Unspecified contact dermatitis, unspecified cause: Secondary | ICD-10-CM

## 2013-01-05 MED ORDER — METHYLPREDNISOLONE ACETATE 80 MG/ML IJ SUSP
80.0000 mg | Freq: Once | INTRAMUSCULAR | Status: AC
  Administered 2013-01-05: 80 mg via INTRAMUSCULAR

## 2013-01-05 NOTE — Patient Instructions (Addendum)
Take Zyrtec 10 mg at bedtime. Use triamcinolone cream in Eucerin topically 3 times daily. Call if not better in 10 days to 2 weeks. You have  been given Depo-Medrol 80 mg IM
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
## 2013-02-04 ENCOUNTER — Other Ambulatory Visit: Payer: Self-pay | Admitting: Internal Medicine

## 2013-02-04 ENCOUNTER — Other Ambulatory Visit (HOSPITAL_COMMUNITY): Payer: Self-pay | Admitting: Internal Medicine

## 2013-02-04 ENCOUNTER — Other Ambulatory Visit: Payer: Self-pay | Admitting: *Deleted

## 2013-02-04 ENCOUNTER — Ambulatory Visit (HOSPITAL_COMMUNITY)
Admission: RE | Admit: 2013-02-04 | Discharge: 2013-02-04 | Disposition: A | Discharge status: Home / Self Care | Payer: 59 | Source: Ambulatory Visit | Attending: Internal Medicine | Admitting: Internal Medicine

## 2013-02-04 ENCOUNTER — Ambulatory Visit (INDEPENDENT_AMBULATORY_CARE_PROVIDER_SITE_OTHER): Payer: 59 | Admitting: Internal Medicine

## 2013-02-04 ENCOUNTER — Encounter: Payer: Self-pay | Admitting: Internal Medicine

## 2013-02-04 VITALS — BP 150/76 | HR 92 | Temp 98.4°F | Wt 250.0 lb

## 2013-02-04 DIAGNOSIS — M25561 Pain in right knee: Secondary | ICD-10-CM

## 2013-02-04 DIAGNOSIS — M79609 Pain in unspecified limb: Secondary | ICD-10-CM

## 2013-02-04 DIAGNOSIS — M7989 Other specified soft tissue disorders: Secondary | ICD-10-CM

## 2013-02-04 DIAGNOSIS — L02419 Cutaneous abscess of limb, unspecified: Secondary | ICD-10-CM

## 2013-02-04 DIAGNOSIS — L03115 Cellulitis of right lower limb: Secondary | ICD-10-CM

## 2013-02-04 DIAGNOSIS — L03119 Cellulitis of unspecified part of limb: Secondary | ICD-10-CM

## 2013-02-04 LAB — CBC W/MCH & 3 PART DIFF
HCT: 44.9 % (ref 39.0–52.0)
MCH: 30.4 pg (ref 26.0–34.0)
MCHC: 34.5 g/dL (ref 30.0–36.0)
MCV: 88 fL (ref 78.0–100.0)
Monocytes Absolute: 0.3 10*3/uL (ref 0.1–1.0)
RDW: 16.4 % — ABNORMAL HIGH (ref 11.5–15.5)

## 2013-02-04 NOTE — Progress Notes (Signed)
  Subjective:    Patient ID: Ned Card, male    DOB: 06-23-1961, 52 y.o.   MRN: [id number]  HPI 52 year old white male comes in today with pain and erythema right calf area. Says some 10 years ago he had a similar issue. Doesn't recall if it was diagnosed as a clot or cellulitis. Doesn't recall any insect bite. History of hyperlipidemia.    Review of Systems     Objective:   Physical Exam erythema, pain and swelling right calf extending toward popliteal fossa. Thickening in right calf area. Pain on palpation along erythematous area. Increased warmth in the area of erythema. He has some surrounding areas of mild folliculitis.       Assessment & Plan:  Possible DVT- Doppler negative  Possible cellulitis- Dx is Cellulitis  Plan: CBC with differential. Right lower extremity Doppler.    Addendum: Doppler is negative for DVT. Treat with Levaquin 500 mg daily x 10 days. Advise warm hot compresses for 20 minutes bid.  Father has been diagnosed with cancer. Apparently had lymph node in mandibular area that was biopsied recently. Father is also on dialysis and has history coronary artery disease.

## 2013-02-04 NOTE — Patient Instructions (Addendum)
Go for lower extremity Doppler of right leg. CBC with differential drawn. CBC is normal. Doppler of right leg is negative for DVT. Take Levaquin 500 milligrams daily for 10 days.

## 2013-02-04 NOTE — Progress Notes (Signed)
*  Preliminary Results* Right lower extremity venous duplex completed. Right lower extremity is negative for deep vein thrombosis. There is no evidence of right Baker's cyst.  02/04/2013 1:06 PM Gertie Fey, RVT, RDCS, RDMS

## 2013-06-08 ENCOUNTER — Other Ambulatory Visit: Payer: 59 | Admitting: Internal Medicine

## 2013-06-08 DIAGNOSIS — E785 Hyperlipidemia, unspecified: Secondary | ICD-10-CM

## 2013-06-08 DIAGNOSIS — Z79899 Other long term (current) drug therapy: Secondary | ICD-10-CM

## 2013-06-09 LAB — HEPATIC FUNCTION PANEL
AST: 19 U/L (ref 0–37)
Albumin: 3.8 g/dL (ref 3.5–5.2)
Alkaline Phosphatase: 27 U/L — ABNORMAL LOW (ref 39–117)
Total Bilirubin: 1 mg/dL (ref 0.3–1.2)
Total Protein: 6.2 g/dL (ref 6.0–8.3)

## 2013-06-09 LAB — LIPID PANEL
HDL: 26 mg/dL — ABNORMAL LOW (ref >39)
Triglycerides: 169 mg/dL — ABNORMAL HIGH (ref <150)

## 2013-06-10 ENCOUNTER — Encounter: Payer: Self-pay | Admitting: Internal Medicine

## 2013-06-10 ENCOUNTER — Ambulatory Visit (INDEPENDENT_AMBULATORY_CARE_PROVIDER_SITE_OTHER): Payer: 59 | Admitting: Internal Medicine

## 2013-06-10 VITALS — BP 146/80 | HR 80 | Temp 98.2°F | Ht 74.0 in | Wt 258.0 lb

## 2013-06-10 DIAGNOSIS — F4321 Adjustment disorder with depressed mood: Secondary | ICD-10-CM

## 2013-06-10 DIAGNOSIS — I10 Essential (primary) hypertension: Secondary | ICD-10-CM

## 2013-06-10 DIAGNOSIS — E785 Hyperlipidemia, unspecified: Secondary | ICD-10-CM

## 2013-06-10 NOTE — Patient Instructions (Signed)
Continue statin medication. Return in 6 weeks for blood pressure check and office visit
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
## 2013-07-20 ENCOUNTER — Encounter: Payer: Self-pay | Admitting: Internal Medicine

## 2013-07-20 ENCOUNTER — Ambulatory Visit (INDEPENDENT_AMBULATORY_CARE_PROVIDER_SITE_OTHER): Payer: 59 | Admitting: Internal Medicine

## 2013-07-20 VITALS — BP 168/84 | HR 88 | Temp 98.0°F | Ht 74.0 in | Wt 260.0 lb

## 2013-07-20 DIAGNOSIS — I10 Essential (primary) hypertension: Secondary | ICD-10-CM

## 2013-07-20 MED ORDER — ATORVASTATIN CALCIUM 20 MG PO TABS: ORAL_TABLET | ORAL | Status: DC

## 2013-07-20 MED ORDER — ATENOLOL 25 MG PO TABS: 25.0000 mg | ORAL_TABLET | Freq: Every day | ORAL | Status: DC

## 2013-07-20 NOTE — Patient Instructions (Signed)
Start Tenormin 25 mg daily and return in late January

## 2013-07-23 ENCOUNTER — Encounter: Payer: Self-pay | Admitting: Internal Medicine

## 2013-07-23 DIAGNOSIS — I10 Essential (primary) hypertension: Secondary | ICD-10-CM | POA: Insufficient documentation
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
## 2013-09-24 ENCOUNTER — Encounter: Payer: Self-pay | Admitting: Internal Medicine

## 2013-09-24 ENCOUNTER — Ambulatory Visit (INDEPENDENT_AMBULATORY_CARE_PROVIDER_SITE_OTHER): Payer: 59 | Admitting: Internal Medicine

## 2013-09-24 VITALS — BP 130/80 | HR 80 | Temp 98.6°F | Wt 270.0 lb

## 2013-09-24 DIAGNOSIS — I10 Essential (primary) hypertension: Secondary | ICD-10-CM

## 2013-09-24 NOTE — Progress Notes (Signed)
   Subjective:    Patient ID: Lance Beard, male    DOB: 07/05/1961, 53 y.o.   MRN: [id number]  HPI Recently started on Tenormin 25 mg daily for hypertension. Feels calmer. Being a [employer] has proven to be a bit stressful with lots of interesting issues. Has important meeting coming up next week. Says he had recent blood pressure check at the dentist which was normal. Has not been checking blood pressure at home.    Review of Systems     Objective:   Physical Exam Blood pressure today is acceptable. Spent 15 minutes speaking with patient about issues.       Assessment & Plan:  Hypertension-stable  Plan: Continue Tenormin 25 mg daily and return in June for physical exam. Monitor blood pressure at home.

## 2013-09-24 NOTE — Patient Instructions (Signed)
Continue Tenormin 25 mg daily. CPE in June

## 2013-12-01 ENCOUNTER — Other Ambulatory Visit: Payer: Self-pay | Admitting: Internal Medicine

## 2014-02-15 ENCOUNTER — Other Ambulatory Visit: Payer: Self-pay | Admitting: Internal Medicine

## 2014-02-15 ENCOUNTER — Other Ambulatory Visit: Payer: 59 | Admitting: Internal Medicine

## 2014-02-15 DIAGNOSIS — E781 Pure hyperglyceridemia: Secondary | ICD-10-CM

## 2014-02-15 DIAGNOSIS — Z125 Encounter for screening for malignant neoplasm of prostate: Secondary | ICD-10-CM

## 2014-02-15 DIAGNOSIS — I10 Essential (primary) hypertension: Secondary | ICD-10-CM

## 2014-02-15 LAB — CBC WITH DIFFERENTIAL/PLATELET
Basophils Absolute: 0 10*3/uL (ref 0.0–0.1)
Basophils Relative: 0 % (ref 0–1)
EOS PCT: 1 % (ref 0–5)
Eosinophils Absolute: 0.1 10*3/uL (ref 0.0–0.7)
HCT: 43.8 % (ref 39.0–52.0)
HEMOGLOBIN: 15 g/dL (ref 13.0–17.0)
LYMPHS ABS: 1.9 10*3/uL (ref 0.7–4.0)
LYMPHS PCT: 31 % (ref 12–46)
MCH: 28.9 pg (ref 26.0–34.0)
MCHC: 34.2 g/dL (ref 30.0–36.0)
MCV: 84.4 fL (ref 78.0–100.0)
MONOS PCT: 7 % (ref 3–12)
Monocytes Absolute: 0.4 10*3/uL (ref 0.1–1.0)
NEUTROS PCT: 61 % (ref 43–77)
Neutro Abs: 3.7 10*3/uL (ref 1.7–7.7)
PLATELETS: 227 10*3/uL (ref 150–400)
RBC: 5.19 MIL/uL (ref 4.22–5.81)
RDW: 16.6 % — ABNORMAL HIGH (ref 11.5–15.5)
WBC: 6 10*3/uL (ref 4.0–10.5)

## 2014-02-15 LAB — COMPREHENSIVE METABOLIC PANEL
ALT: 22 U/L (ref 0–53)
AST: 15 U/L (ref 0–37)
Albumin: 4 g/dL (ref 3.5–5.2)
Alkaline Phosphatase: 28 U/L — ABNORMAL LOW (ref 39–117)
BUN: 13 mg/dL (ref 6–23)
CALCIUM: 9.1 mg/dL (ref 8.4–10.5)
CHLORIDE: 104 meq/L (ref 96–112)
CO2: 28 meq/L (ref 19–32)
Creat: 1.12 mg/dL (ref 0.50–1.35)
GLUCOSE: 104 mg/dL — AB (ref 70–99)
Potassium: 4.4 mEq/L (ref 3.5–5.3)
SODIUM: 140 meq/L (ref 135–145)
TOTAL PROTEIN: 6.3 g/dL (ref 6.0–8.3)
Total Bilirubin: 1.1 mg/dL (ref 0.2–1.2)

## 2014-02-15 LAB — LIPID PANEL
CHOLESTEROL: 81 mg/dL (ref 0–200)
HDL: 23 mg/dL — ABNORMAL LOW (ref >39)
LDL Cholesterol: 27 mg/dL (ref 0–99)
TRIGLYCERIDES: 154 mg/dL — AB (ref <150)
Total CHOL/HDL Ratio: 3.5 Ratio
VLDL: 31 mg/dL (ref 0–40)

## 2014-02-16 LAB — PSA: PSA: 1.42 ng/mL (ref <=4.00)

## 2014-02-17 ENCOUNTER — Ambulatory Visit (INDEPENDENT_AMBULATORY_CARE_PROVIDER_SITE_OTHER): Payer: 59 | Admitting: Internal Medicine

## 2014-02-17 ENCOUNTER — Encounter: Payer: Self-pay | Admitting: Internal Medicine

## 2014-02-17 VITALS — BP 120/80 | HR 68 | Temp 98.2°F | Ht 74.0 in | Wt 262.0 lb

## 2014-02-17 DIAGNOSIS — E785 Hyperlipidemia, unspecified: Secondary | ICD-10-CM

## 2014-02-17 DIAGNOSIS — R5381 Other malaise: Secondary | ICD-10-CM

## 2014-02-17 DIAGNOSIS — Z0000 Encounter for general adult medical examination without abnormal findings: Secondary | ICD-10-CM

## 2014-02-17 DIAGNOSIS — Z87898 Personal history of other specified conditions: Secondary | ICD-10-CM

## 2014-02-17 DIAGNOSIS — Z8669 Personal history of other diseases of the nervous system and sense organs: Secondary | ICD-10-CM

## 2014-02-17 DIAGNOSIS — I10 Essential (primary) hypertension: Secondary | ICD-10-CM

## 2014-02-17 DIAGNOSIS — E669 Obesity, unspecified: Secondary | ICD-10-CM

## 2014-02-17 DIAGNOSIS — R7302 Impaired glucose tolerance (oral): Secondary | ICD-10-CM | POA: Insufficient documentation

## 2014-02-17 DIAGNOSIS — R5383 Other fatigue: Secondary | ICD-10-CM

## 2014-02-17 LAB — POCT URINALYSIS DIPSTICK
BILIRUBIN UA: NEGATIVE
Blood, UA: NEGATIVE
GLUCOSE UA: NEGATIVE
KETONES UA: NEGATIVE
LEUKOCYTES UA: NEGATIVE
Nitrite, UA: NEGATIVE
Protein, UA: NEGATIVE
SPEC GRAV UA: 1.015
Urobilinogen, UA: NEGATIVE
pH, UA: 6

## 2014-02-17 LAB — VITAMIN B12: VITAMIN B 12: 283 pg/mL (ref 211–911)

## 2014-02-17 LAB — HEMOGLOBIN A1C
HEMOGLOBIN A1C: 6.2 % — AB (ref <5.7)
MEAN PLASMA GLUCOSE: 131 mg/dL — AB (ref <117)

## 2014-02-17 LAB — TSH: TSH: 1.911 u[IU]/mL (ref 0.350–4.500)

## 2014-02-17 NOTE — Patient Instructions (Signed)
B12, TSH, hemoglobin A1c being checked for fatigue symptoms. Otherwise return in 6 months. Recommend diet exercise and weight loss.

## 2014-02-17 NOTE — Progress Notes (Signed)
Subjective:    Patient ID: Lance Beard, male    DOB: October 25, 1960, 53 y.o.   MRN: [id number]  HPI  53 year old male in today for health maintenance exam and a weight of medical issues. BMI is 34. Not getting as much exercise as he would like to. 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 Talked with him about diet and exercise. He has a history of sleep apnea and has not had sleep apnea apparatus checked in some time. Were going to send it makes home care down to do that. He wonders if antihypertensive medication is making him tired. He is only on atenolol 25 mg daily. He does take aspirin 81 mg daily and Lipitor for hyperlipidemia.  In January 2014 he was evaluated by Cardiologist for nonspecific chest pain. He had a negative nuclear medicine study after having a false positive stress test. Previous Cardiolite study in 2004 was negative. 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 Has seen Dr. Annamaria Boots regarding obstructive sleep apnea with hypersomnia in 2007. CPAP was recommended at 12.  No known drug allergies  Past medical history: Fractured nose playing ball in 1981. Appendectomy 1981. Surgery for left varicocele 1991. Surgery for nasal septal deviation 1984.  Family history: Mother in good health, sister in good health. Father with history of diabetes hypertension, coronary artery disease, renal failure, died with multiple myeloma.  Social history: He is married. Has one adopted daughter who is a Therapist, art in college. Wife is a Art therapist in the [place] school system. She retired but has returned to work part-time. Patient does not smoke or consume alcohol.  Review of Systems  Constitutional: Positive for fatigue.  HENT: Negative.   Eyes: Negative.   Respiratory: Negative.   Cardiovascular: Negative.   Endocrine: Negative.   Genitourinary: Negative.   Neurological: Negative.   Hematological: Negative.     Psychiatric/Behavioral: Negative.        Objective:   Physical Exam  Vitals reviewed. Constitutional: He is oriented to person, place, and time. He appears well-developed and well-nourished. No distress.  HENT:  Head: Normocephalic and atraumatic.  Right Ear: External ear normal.  Left Ear: External ear normal.  Mouth/Throat: Oropharynx is clear and moist. No oropharyngeal exudate.  Eyes: Conjunctivae and EOM are normal. Pupils are equal, round, and reactive to light. Right eye exhibits no discharge. Left eye exhibits no discharge. No scleral icterus.  Neck: Neck supple. No JVD present. No thyromegaly present.  Cardiovascular: Normal rate, regular rhythm, normal heart sounds and intact distal pulses.   No murmur heard. Pulmonary/Chest: Effort normal and breath sounds normal. No respiratory distress. He has no wheezes. He has no rales. He exhibits no tenderness.  Abdominal: Soft. Bowel sounds are normal. He exhibits no distension and no mass. There is no tenderness. There is no rebound and no guarding.  Genitourinary: Prostate normal.  Musculoskeletal: Normal range of motion. He exhibits no edema.  Lymphadenopathy:    He has no cervical adenopathy.  Neurological: He is alert and oriented to person, place, and time. He has normal reflexes. He displays normal reflexes. No cranial nerve deficit. Coordination normal.  Skin: Skin is warm and dry. No rash noted. He is not diaphoretic.  Psychiatric: He has a normal mood and affect. His behavior is normal. Judgment and thought content normal.          Assessment & Plan:   Fatigue-check TSH, hemoglobin A1c since glucose  fasting is 104 and he has family history of diabetes, check B12 level  Hypertension-well-controlled on low-dose Tenormin  History of sleep apnea-advanced compared to check his sleep apnea apparatus to see if this helps with fatigue. He's not had it checked in a number of years. He does buy a new supplies but hasn't had  apparatus checked.  Hyperlipidemia-stable on current regimen  Obesity-BMI is 34. Recommend diet and exercise  Plan: Return to clinic in 6 months for office visit, lipid panel, liver functions and blood pressure check . Patient reminded regarding colonoscopy.

## 2014-02-18 NOTE — Progress Notes (Signed)
Mailed these results to patient.

## 2014-06-02 ENCOUNTER — Other Ambulatory Visit: Payer: Self-pay | Admitting: Internal Medicine

## 2014-06-07 ENCOUNTER — Other Ambulatory Visit: Payer: Self-pay | Admitting: Internal Medicine

## 2014-06-23 ENCOUNTER — Encounter: Payer: Self-pay | Admitting: Internal Medicine

## 2014-08-03 ENCOUNTER — Other Ambulatory Visit: Payer: Self-pay | Admitting: Internal Medicine

## 2014-08-15 ENCOUNTER — Other Ambulatory Visit: Payer: 59 | Admitting: Internal Medicine

## 2014-08-15 DIAGNOSIS — Z79899 Other long term (current) drug therapy: Secondary | ICD-10-CM

## 2014-08-15 DIAGNOSIS — Z13220 Encounter for screening for lipoid disorders: Secondary | ICD-10-CM

## 2014-08-15 LAB — HEPATIC FUNCTION PANEL
ALT: 32 U/L (ref 0–53)
AST: 16 U/L (ref 0–37)
Albumin: 4.1 g/dL (ref 3.5–5.2)
Alkaline Phosphatase: 31 U/L — ABNORMAL LOW (ref 39–117)
BILIRUBIN DIRECT: 0.2 mg/dL (ref 0.0–0.3)
BILIRUBIN INDIRECT: 0.6 mg/dL (ref 0.2–1.2)
BILIRUBIN TOTAL: 0.8 mg/dL (ref 0.2–1.2)
Total Protein: 6.4 g/dL (ref 6.0–8.3)

## 2014-08-15 LAB — LIPID PANEL
CHOL/HDL RATIO: 3.8 ratio
Cholesterol: 90 mg/dL (ref 0–200)
HDL: 24 mg/dL — ABNORMAL LOW (ref >39)
LDL Cholesterol: 20 mg/dL (ref 0–99)
Triglycerides: 232 mg/dL — ABNORMAL HIGH (ref <150)
VLDL: 46 mg/dL — ABNORMAL HIGH (ref 0–40)

## 2014-08-16 ENCOUNTER — Encounter: Payer: Self-pay | Admitting: Internal Medicine

## 2014-08-16 ENCOUNTER — Ambulatory Visit (INDEPENDENT_AMBULATORY_CARE_PROVIDER_SITE_OTHER): Payer: 59 | Admitting: Internal Medicine

## 2014-08-16 VITALS — BP 116/64 | HR 73 | Temp 98.0°F | Wt 267.0 lb

## 2014-08-16 DIAGNOSIS — E785 Hyperlipidemia, unspecified: Secondary | ICD-10-CM

## 2014-08-16 DIAGNOSIS — I10 Essential (primary) hypertension: Secondary | ICD-10-CM

## 2014-08-16 NOTE — Progress Notes (Signed)
   Subjective:    Patient ID: Lance Beard, male    DOB: [birth year], 53 y.o.   MRN: [id number]  HPI  53 year old Male for 6 month follow up of HTN and hyperlipidemia. Triglycerides are elevated. Probably has not been following a strict diet. Busy schedule. No new complaints.    Review of Systems     Objective:   Physical Exam  Neck is supple without JVD thyromegaly or carotid bruits. Chest clear to auscultation. Cardiac exam regular rate and rhythm normal S1 and S2. Extremities without edema. Triglycerides 232 previously were 154 when last checked      Assessment & Plan:  Hypertriglyceridemia-significant elevation since last measurement  Hypertension-stable  Plan: Work on diet and exercise. Continue same medications. Return in 6 months for physical exam.

## 2014-09-04 NOTE — Patient Instructions (Signed)
Work on diet and exercise. Continue same meds and return in 6 months for physical exam

## 2014-09-15 ENCOUNTER — Ambulatory Visit (INDEPENDENT_AMBULATORY_CARE_PROVIDER_SITE_OTHER): Payer: Managed Care, Other (non HMO) | Admitting: Internal Medicine

## 2014-09-15 ENCOUNTER — Encounter: Payer: Self-pay | Admitting: Internal Medicine

## 2014-09-15 VITALS — BP 122/68 | HR 72 | Temp 97.9°F | Wt 266.0 lb

## 2014-09-15 DIAGNOSIS — H6592 Unspecified nonsuppurative otitis media, left ear: Secondary | ICD-10-CM

## 2014-09-15 MED ORDER — PREDNISONE 10 MG PO TABS: ORAL_TABLET | ORAL | Status: DC

## 2014-09-15 MED ORDER — LEVOFLOXACIN 500 MG PO TABS: 500.0000 mg | ORAL_TABLET | Freq: Every day | ORAL | Status: DC

## 2014-09-15 NOTE — Patient Instructions (Signed)
Take Levaquin 500 milligrams daily for 10 days. Take Sterapred DS 10 mg six-day Dosepak as directed. Call if not better in 10 days

## 2014-09-15 NOTE — Progress Notes (Signed)
   Subjective:    Patient ID: Lance Beard, male    DOB: 02-01-1961, 54 y.o.   MRN: [id number]  HPI Patient came down with upper respiratory infection 08/23/2014 and went to [facility] in [place] where he was diagnosed with left otitis media and sinusitis. He was treated with Augmentin 875 mg twice daily for 10 days and a six-day prednisone 10 mg tapering dose pack. Had a sore throat,  swollen glands and left ear discomfort at that time. He got some better but not completely well so he returned to that same clinic on January 11. Was treated with Omnicef 300 mg twice daily for 10 days and Mucinex. Was diagnosed with a left otitis media.  Presents today complaining of left ear fullness. Feels the glands are still a bit swollen. Says he has a history of having to have grommet tube placed some 20 years ago.    Review of Systems     Objective:   Physical Exam  Pharynx is clear. Right TM is clear. Left TM is slightly full with a good light reflex and slightly pink but not red. Neck is supple. Chest clear.      Assessment & Plan:  Left serous otitis media  Plan: Levaquin 500 milligrams daily for 10 days. Sterapred DS 10 mg 6 day dosepak- take as directed. If symptoms do not resolve after this treatment, refer to ENT physician.

## 2014-10-28 ENCOUNTER — Encounter: Payer: Self-pay | Admitting: Internal Medicine

## 2014-10-28 ENCOUNTER — Ambulatory Visit (INDEPENDENT_AMBULATORY_CARE_PROVIDER_SITE_OTHER): Payer: 59 | Admitting: Internal Medicine

## 2014-10-28 VITALS — BP 134/60 | HR 79 | Temp 97.8°F | Ht 74.0 in | Wt 276.0 lb

## 2014-10-28 DIAGNOSIS — H6503 Acute serous otitis media, bilateral: Secondary | ICD-10-CM

## 2014-10-28 DIAGNOSIS — J069 Acute upper respiratory infection, unspecified: Secondary | ICD-10-CM

## 2014-10-28 MED ORDER — LEVOFLOXACIN 500 MG PO TABS: 500.0000 mg | ORAL_TABLET | Freq: Every day | ORAL | Status: DC

## 2014-10-28 NOTE — Patient Instructions (Addendum)
Take Levaquin 500 milligrams daily for 10 days. Call if not better by March 11 or sooner if worse

## 2014-10-28 NOTE — Progress Notes (Signed)
   Subjective:    Patient ID: Lance Beard, male    DOB: June 06, 1961, 54 y.o.   MRN: [id number]  HPI Onset February 2019 of URI symptoms. Cough and congestion. Some postnasal drip. No fever or shaking chills. Slight discolored nasal drainage and sputum production. Malaise and fatigue.    Review of Systems     Objective:   Physical Exam  TMs are full bilaterally but not red. Pharynx slightly injected without exudate. Neck is supple. Chest clear to auscultation      Assessment & Plan:  Acute URI  Bilateral serous otitis media  Plan: He has similar respiratory infection January 21. Treat with Levaquin 500 milligrams daily for 10 days. Has Delsym for cough. Call if not better in 7 days.

## 2014-11-17 ENCOUNTER — Other Ambulatory Visit: Payer: Managed Care, Other (non HMO) | Admitting: Internal Medicine

## 2014-11-17 DIAGNOSIS — E785 Hyperlipidemia, unspecified: Secondary | ICD-10-CM

## 2014-11-17 DIAGNOSIS — Z79899 Other long term (current) drug therapy: Secondary | ICD-10-CM

## 2014-11-17 LAB — HEPATIC FUNCTION PANEL
ALT: 46 U/L (ref 0–53)
AST: 26 U/L (ref 0–37)
Albumin: 4 g/dL (ref 3.5–5.2)
Alkaline Phosphatase: 27 U/L — ABNORMAL LOW (ref 39–117)
BILIRUBIN INDIRECT: 1.1 mg/dL (ref 0.2–1.2)
Bilirubin, Direct: 0.2 mg/dL (ref 0.0–0.3)
Total Bilirubin: 1.3 mg/dL — ABNORMAL HIGH (ref 0.2–1.2)
Total Protein: 6.1 g/dL (ref 6.0–8.3)

## 2014-11-17 LAB — LIPID PANEL
CHOLESTEROL: 93 mg/dL (ref 0–200)
HDL: 18 mg/dL — ABNORMAL LOW (ref >=40)
LDL Cholesterol: 23 mg/dL (ref 0–99)
TRIGLYCERIDES: 258 mg/dL — AB (ref <150)
Total CHOL/HDL Ratio: 5.2 Ratio
VLDL: 52 mg/dL — AB (ref 0–40)

## 2014-11-21 ENCOUNTER — Telehealth: Payer: Self-pay | Admitting: *Deleted

## 2014-11-21 NOTE — Telephone Encounter (Signed)
Reviewed labs with patient. °

## 2014-11-21 NOTE — Telephone Encounter (Signed)
Patient prefers to see a nutritionist at this time instead of increased medications for his elevated triglycerides . Gave patient name and phone number for dietician

## 2015-02-21 ENCOUNTER — Other Ambulatory Visit: Payer: 59 | Admitting: Internal Medicine

## 2015-02-21 DIAGNOSIS — R7309 Other abnormal glucose: Secondary | ICD-10-CM

## 2015-02-21 DIAGNOSIS — Z0000 Encounter for general adult medical examination without abnormal findings: Secondary | ICD-10-CM

## 2015-02-21 DIAGNOSIS — Z125 Encounter for screening for malignant neoplasm of prostate: Secondary | ICD-10-CM

## 2015-02-21 DIAGNOSIS — Z13220 Encounter for screening for lipoid disorders: Secondary | ICD-10-CM

## 2015-02-21 LAB — HEMOGLOBIN A1C
HEMOGLOBIN A1C: 5.5 % (ref <5.7)
MEAN PLASMA GLUCOSE: 111 mg/dL (ref <117)

## 2015-02-21 LAB — COMPLETE METABOLIC PANEL WITH GFR
ALBUMIN: 4.1 g/dL (ref 3.5–5.2)
ALT: 22 U/L (ref 0–53)
AST: 14 U/L (ref 0–37)
Alkaline Phosphatase: 27 U/L — ABNORMAL LOW (ref 39–117)
BUN: 16 mg/dL (ref 6–23)
CALCIUM: 9.1 mg/dL (ref 8.4–10.5)
CHLORIDE: 103 meq/L (ref 96–112)
CO2: 27 meq/L (ref 19–32)
Creat: 0.99 mg/dL (ref 0.50–1.35)
GFR, Est African American: >89 mL/min
GFR, Est Non African American: 87 mL/min
Glucose, Bld: 85 mg/dL (ref 70–99)
POTASSIUM: 4.4 meq/L (ref 3.5–5.3)
SODIUM: 141 meq/L (ref 135–145)
TOTAL PROTEIN: 6.1 g/dL (ref 6.0–8.3)
Total Bilirubin: 0.9 mg/dL (ref 0.2–1.2)

## 2015-02-21 LAB — LIPID PANEL
CHOLESTEROL: 67 mg/dL (ref 0–200)
HDL: 21 mg/dL — AB (ref >=40)
LDL Cholesterol: 32 mg/dL (ref 0–99)
TRIGLYCERIDES: 68 mg/dL (ref <150)
Total CHOL/HDL Ratio: 3.2 Ratio
VLDL: 14 mg/dL (ref 0–40)

## 2015-02-21 LAB — CBC WITH DIFFERENTIAL/PLATELET
BASOS ABS: 0 10*3/uL (ref 0.0–0.1)
Basophils Relative: 0 % (ref 0–1)
Eosinophils Absolute: 0.1 10*3/uL (ref 0.0–0.7)
Eosinophils Relative: 1 % (ref 0–5)
HEMATOCRIT: 45.3 % (ref 39.0–52.0)
HEMOGLOBIN: 15.4 g/dL (ref 13.0–17.0)
LYMPHS PCT: 23 % (ref 12–46)
Lymphs Abs: 1.4 10*3/uL (ref 0.7–4.0)
MCH: 30.7 pg (ref 26.0–34.0)
MCHC: 34 g/dL (ref 30.0–36.0)
MCV: 90.4 fL (ref 78.0–100.0)
MONO ABS: 0.5 10*3/uL (ref 0.1–1.0)
MONOS PCT: 8 % (ref 3–12)
MPV: 10.1 fL (ref 8.6–12.4)
NEUTROS ABS: 4.1 10*3/uL (ref 1.7–7.7)
Neutrophils Relative %: 68 % (ref 43–77)
Platelets: 206 10*3/uL (ref 150–400)
RBC: 5.01 MIL/uL (ref 4.22–5.81)
RDW: 13.8 % (ref 11.5–15.5)
WBC: 6 10*3/uL (ref 4.0–10.5)

## 2015-02-22 LAB — PSA: PSA: 1.2 ng/mL (ref <=4.00)

## 2015-02-23 ENCOUNTER — Encounter: Payer: Self-pay | Admitting: Internal Medicine

## 2015-02-23 ENCOUNTER — Ambulatory Visit (INDEPENDENT_AMBULATORY_CARE_PROVIDER_SITE_OTHER): Payer: 59 | Admitting: Internal Medicine

## 2015-02-23 VITALS — BP 120/72 | HR 54 | Temp 98.0°F | Ht 74.0 in | Wt 234.0 lb

## 2015-02-23 DIAGNOSIS — R7309 Other abnormal glucose: Secondary | ICD-10-CM

## 2015-02-23 DIAGNOSIS — G4733 Obstructive sleep apnea (adult) (pediatric): Secondary | ICD-10-CM | POA: Diagnosis not present

## 2015-02-23 DIAGNOSIS — I10 Essential (primary) hypertension: Secondary | ICD-10-CM

## 2015-02-23 DIAGNOSIS — R7302 Impaired glucose tolerance (oral): Secondary | ICD-10-CM | POA: Diagnosis not present

## 2015-02-23 DIAGNOSIS — E785 Hyperlipidemia, unspecified: Secondary | ICD-10-CM | POA: Diagnosis not present

## 2015-02-23 DIAGNOSIS — E669 Obesity, unspecified: Secondary | ICD-10-CM | POA: Diagnosis not present

## 2015-02-23 DIAGNOSIS — Z0000 Encounter for general adult medical examination without abnormal findings: Secondary | ICD-10-CM

## 2015-02-23 DIAGNOSIS — Z1211 Encounter for screening for malignant neoplasm of colon: Secondary | ICD-10-CM | POA: Diagnosis not present

## 2015-02-23 LAB — POCT URINALYSIS DIPSTICK
Bilirubin, UA: NEGATIVE
Blood, UA: NEGATIVE
Glucose, UA: NEGATIVE
KETONES UA: NEGATIVE
Leukocytes, UA: NEGATIVE
NITRITE UA: NEGATIVE
Protein, UA: NEGATIVE
Spec Grav, UA: 1.015
UROBILINOGEN UA: NEGATIVE
pH, UA: 6.5

## 2015-02-23 NOTE — Progress Notes (Signed)
Subjective:    Patient ID: Lance Beard, male    DOB: 02-11-1961, 54 y.o.   MRN: [id number]  HPI 54 year old White Male in today for health maintenance exam and evaluation of medical issues. In June 2015 he weighed 262 pounds. He's been to [facility] for dietary counseling and has lost down to 234 pounds. In March she was 276 pounds. He is significantly changed the way he is eating. He feels fine. His blood pressure is excellent. Lipid panel is normal.  He has a history of sleep apnea and we recently signed to have supplies renewed. He's asking if he should see pulmonologist again. I think this probably not necessary. If anything, the sleep apnea will improve with weight loss. He also has a history of hypertension and has been maintained on low-dose Tenormin. Tenormin was chosen because of labile hypertension issues.  No known drug allergies  He takes aspirin 81 mg daily. He takes Lipitor for hyperlipidemia.  In January 2014 he was evaluated by cardiologist for nonspecific chest pain and had a negative nuclear medicine study after having a false positive stress test. 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 Previous Cardiolite study in 2004 was negative. Dr. Annamaria Boots has seen him for sleep apnea in 2007. C Pap was recommended and 11.  Past medical history: Fractured nose playing ball in 1981. Appendectomy 1981. Surgery for left varicocele 1991. Surgery for nasal septal deviation 1984.  Social history: He is married. Has one adopted daughter who recently graduated from college. Wife is a Art therapist in the [place] school system. She retired but has returned to work part-time. Patient does not smoke or consume alcohol. 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  Family history: Mother in good health. Sister in good health. Father  with history of diabetes, hypertension, coronary artery disease,  renal failure died with complications of multiple myeloma.    Review of Systems  Constitutional: Negative.   All other systems reviewed and are negative.      Objective:   Physical Exam  Constitutional: He is oriented to person, place, and time. He appears well-developed and well-nourished. No distress.  HENT:  Head: Normocephalic and atraumatic.  Right Ear: External ear normal.  Left Ear: External ear normal.  Mouth/Throat: Oropharynx is clear and moist. No oropharyngeal exudate.  Eyes: Conjunctivae and EOM are normal. Pupils are equal, round, and reactive to light. Right eye exhibits no discharge. Left eye exhibits no discharge. No scleral icterus.  Neck: Neck supple. No JVD present. No thyromegaly present.  Cardiovascular: Normal rate, regular rhythm, normal heart sounds and intact distal pulses.   No murmur heard. Pulmonary/Chest: Effort normal and breath sounds normal. No respiratory distress. He has no wheezes. He has no rales.  Abdominal: Soft. Bowel sounds are normal. He exhibits no distension. There is no tenderness. There is no rebound and no guarding.  Genitourinary: Prostate normal.  Musculoskeletal: Normal range of motion. He exhibits no edema.  Lymphadenopathy:    He has no cervical adenopathy.  Neurological: He is alert and oriented to person, place, and time. He has normal reflexes. No cranial nerve deficit. Coordination normal.  Skin: Skin is warm and dry. No rash noted. He is not diaphoretic.  Psychiatric: He has a normal mood and affect. His behavior is normal. Judgment and thought content normal.  Vitals reviewed.         Assessment &  Plan:  Essential hypertension-stable. Weight loss is certainly help that I believe  Hyperlipidemia-lipid panel liver functions normal  Obesity-market improvement with weight loss. I am pleased with his results with dietitian  Health maintenance-needs to have colonoscopy. Arrange referral. Tetanus immunization is  up-to-date.  History of sleep apnea-continue C Pap but he may actually find it has improved with weight loss  Plan: Return in 6 months or as needed. Continue same medications.

## 2015-02-23 NOTE — Patient Instructions (Signed)
Congratulations on your excellent weight loss. Continue diet and exercise. Return in 6 months. Continue same medications. It was a pleasure to see you today.

## 2015-02-24 LAB — MICROALBUMIN / CREATININE URINE RATIO
Creatinine, Urine: 155.9 mg/dL
MICROALB/CREAT RATIO: 10.9 mg/g (ref 0.0–30.0)
Microalb, Ur: 1.7 mg/dL (ref <2.0)

## 2015-03-02 ENCOUNTER — Telehealth: Payer: Self-pay | Admitting: Internal Medicine

## 2015-03-02 NOTE — Telephone Encounter (Signed)
I considered that at his visit but chose not to. If he wants to lower dose to 10 mg of Lipitor daily it willrequire repeating lipid /liver in 3 months with Office visit

## 2015-03-02 NOTE — Telephone Encounter (Signed)
After patient visiting Dr. Theda Sers yesterday, she suggested Dr. Renold Genta reduce patient's cholesterol meds. Please call patient with Dr. Renold Genta suggestions.

## 2015-03-02 NOTE — Telephone Encounter (Signed)
Spoke with patient he will remain on  Current dosage of cholesterol medication

## 2015-03-30 ENCOUNTER — Encounter: Payer: Self-pay | Admitting: Internal Medicine

## 2015-03-30 ENCOUNTER — Encounter (INDEPENDENT_AMBULATORY_CARE_PROVIDER_SITE_OTHER): Payer: Self-pay

## 2015-03-30 ENCOUNTER — Ambulatory Visit (INDEPENDENT_AMBULATORY_CARE_PROVIDER_SITE_OTHER): Payer: 59 | Admitting: Internal Medicine

## 2015-03-30 VITALS — BP 130/70 | HR 53 | Temp 98.4°F | Ht 74.0 in | Wt 229.0 lb

## 2015-03-30 DIAGNOSIS — G4733 Obstructive sleep apnea (adult) (pediatric): Secondary | ICD-10-CM

## 2015-03-30 DIAGNOSIS — I10 Essential (primary) hypertension: Secondary | ICD-10-CM

## 2015-03-30 DIAGNOSIS — R7302 Impaired glucose tolerance (oral): Secondary | ICD-10-CM

## 2015-03-30 DIAGNOSIS — Z8669 Personal history of other diseases of the nervous system and sense organs: Secondary | ICD-10-CM

## 2015-03-30 DIAGNOSIS — Z87898 Personal history of other specified conditions: Secondary | ICD-10-CM | POA: Diagnosis not present

## 2015-03-30 NOTE — Assessment & Plan Note (Signed)
-  Reviewed results of most recent sleep study performed on 01/11/2011. The patient was noted to have moderate effect of sleep apnea with an apnea-hypopnea index of 18.5 he underwent a split night study at that time and was titrated to CPAP pressure of 11, he was also noted to have periodic limb movements with arousals, though the arousal index was 2.5. 

## 2015-03-30 NOTE — Progress Notes (Signed)
[facility] Consultation      Assessment and Plan:  History of sleep apnea -Reviewed results of most recent sleep study performed on 01/11/2004. The patient was noted to have moderate effect of sleep apnea with an apnea-hypopnea index of 18.5 he underwent a split night study at that time and was titrated to CPAP pressure of 11, he was also noted to have periodic limb movements with arousals, though the arousal index was 2.5.    Date: 03/30/2015  MRN# [id number] Lance Beard 1961-02-11  Referring Physician: Dr. Erroll Luna is a 54 y.o. old male seen in consultation for Sleep apnea.   CC:  Chief Complaint  Patient presents with  . Advice Only    Pt referred by pcp for sleep apnea. Last sleep study 2005 @ [facility]. Pt needs new rx for supplies.    HPI:   Pt had last seen Dr. Annamaria Boots at the time he was initially diagnosed with sleep apnea. He has been using a replacement machine, S8, which is about 53 years old. He is currently doing well and he is doing well with his CPAP.  He is doing well and feels that he is not feeling sleepy anymore. He is predominantly here for prescription for supplies.  Has has not been told that he is snoring again when he is wearing the mask.  Goes to bed at 1030 pm, and wakes at 515 am. When wakes in am feels rested. On weekends he wakes at around the same time.     PMHX:   Past Medical History  Diagnosis Date  . Hyperlipidemia   . Sleep apnea   . Hx of cardiovascular stress test     a. ETT-MV 3/14:  No scar or ischemia, EF 62%   Surgical Hx:  Past Surgical History  Procedure Laterality Date  . Appendectomy    . Varicocele excision    . Nasal septum surgery     Family Hx:  Family History  Problem Relation Age of Onset  . Diabetes Father   . Hypertension Father   . CAD Father     Approximate age 37   . Lung cancer Father    Social Hx:   History  Substance Use Topics  . Smoking status: Never Smoker   .  Smokeless tobacco: Never Used  . Alcohol Use: No   Medication:   Current Outpatient Rx  Name  Route  Sig  Dispense  Refill  . aspirin 81 MG tablet   Oral   Take 81 mg by mouth daily.           Marland Kitchen atenolol (TENORMIN) 25 MG tablet      TAKE 1 TABLET BY MOUTH DAILY.   30 tablet   5   . atorvastatin (LIPITOR) 20 MG tablet      TAKE 1 TABLET BY MOUTH DAILY   90 tablet   3   . B Complex-C (B-COMPLEX WITH VITAMIN C) tablet   Oral   Take 1 tablet by mouth daily.             Allergies:  Review of patient's allergies indicates no known allergies.  Review of Systems: Gen:  Denies  fever, sweats, chills HEENT: Denies blurred vision, double vision, Cvc:  No dizziness, chest pain or heaviness Resp:   Denies cough or sputum porduction, shortness of breath Gi: Denies swallowing difficulty, stomach pain, nausea or vomiting,  Gu:  Denies bladder incontinence, burning urine Ext:  No Joint pain, stiffness or swelling Skin: No skin rash, easy bruising or bleeding or hives Endoc:  No polyuria, polydipsia , polyphagia or weight change Psych: No depression, insomnia or hallucinations  Other:  All other systems negative  Physical Examination:   VS: BP 130/70 mmHg  Pulse 53  Temp(Src) 98.4 F (36.9 C) (Oral)  Ht 6\' 2"  (1.88 m)  Wt 229 lb (103.874 kg)  BMI 29.39 kg/m2  SpO2 96%  General Appearance: No distress  Neuro:without focal findings,  speech normal,  HEENT: PERRLA, EOM intact.  Mallampati 3 Pulmonary: normal breath sounds, No wheezing, No rales;    CardiovascularNormal S1,S2.  No m/r/g.   Abdomen: Benign, Soft, non-tender. Renal:  No costovertebral tenderness  GU:  No performed at this time. Endoc: No evident thyromegaly, no signs of acromegaly or Cushing features Skin:   warm, no rashes, no ecchymosis  Extremities: normal, no cyanosis, clubbing, no edema.     Other: Sleep study results reviewed, as above.      Orders for this visit: No orders of the defined  types were placed in this encounter.     Thank  you for the consultation and for allowing [facility] to assist in the care of your patient. Our recommendations are noted above.  Please contact us if we can be of further service.   Marda Stalker, MD.  Board Certified in Internal Medicine, Pulmonary Medicine, [place], and Sleep Medicine.  [facility] Office Number: [phone]  Patricia Pesa, M.D.  Vilinda Boehringer, M.D.  Cheral Marker, M.D

## 2015-03-30 NOTE — Patient Instructions (Signed)
--  Continue with cpap use.  -Renew supplies and prescribe new machine at CPAP level of 11.

## 2015-03-30 NOTE — Assessment & Plan Note (Signed)
----  Continue with cpap use.  -Renew supplies and prescribe new machine at CPAP level of 11.

## 2015-03-30 NOTE — Addendum Note (Signed)
Addended by: Devona Konig on: 03/30/2015 11:11 AM   Modules accepted: Orders

## 2015-04-03 ENCOUNTER — Ambulatory Visit (INDEPENDENT_AMBULATORY_CARE_PROVIDER_SITE_OTHER): Payer: Managed Care, Other (non HMO) | Admitting: Internal Medicine

## 2015-04-03 ENCOUNTER — Encounter: Payer: Self-pay | Admitting: Internal Medicine

## 2015-04-03 VITALS — BP 130/76 | HR 57 | Temp 97.9°F | Wt 228.0 lb

## 2015-04-03 DIAGNOSIS — K648 Other hemorrhoids: Secondary | ICD-10-CM | POA: Diagnosis not present

## 2015-04-03 DIAGNOSIS — K625 Hemorrhage of anus and rectum: Secondary | ICD-10-CM | POA: Diagnosis not present

## 2015-04-03 DIAGNOSIS — K645 Perianal venous thrombosis: Secondary | ICD-10-CM

## 2015-04-03 LAB — HEMOCCULT GUIAC POC 1CARD (OFFICE): Fecal Occult Blood, POC: POSITIVE — AB

## 2015-04-03 MED ORDER — HYDROCORTISONE 2.5 % RE CREA: 1.0000 "application " | TOPICAL_CREAM | Freq: Two times a day (BID) | RECTAL | Status: DC

## 2015-04-03 MED ORDER — HYDROCORTISONE ACETATE 25 MG RE SUPP: 25.0000 mg | Freq: Two times a day (BID) | RECTAL | Status: DC

## 2015-04-03 NOTE — Patient Instructions (Addendum)
Use ProctoCream-HC twice daily. Warm sitz baths 20 minutes daily. Use Anusol suppository twice daily. Keep colonoscopy appointment in September.

## 2015-04-25 ENCOUNTER — Encounter: Payer: Self-pay | Admitting: Internal Medicine

## 2015-04-25 NOTE — Progress Notes (Signed)
   Subjective:    Patient ID: Lance Beard, male    DOB: Jan 08, 1961, 54 y.o.   MRN: [id number]  HPI    54 year old White Male who has an appointment to have screening colonoscopy in September in today with complaint of seeing some blood on toilet tissue and in toilet bowl recently. Doesn't recall significant constipation. Has been doing some heavy lifting around his [place]. This alarmed him.    Review of Systems     Objective:   Physical Exam Stool is guaiac positive. No active bleeding noted. Anoscopy shows internal hemorrhoids. He has a thrombosed external hemorrhoid as well.       Assessment & Plan:  Rectal bleeding  Internal hemorrhoids  Thrombosed external hemorrhoid  Plan warm sitz baths for 20 minutes daily. ProctoCream-HC externally 2 times daily. Anusol HC suppositories twice daily.

## 2015-05-02 ENCOUNTER — Telehealth: Payer: Self-pay | Admitting: Internal Medicine

## 2015-05-02 NOTE — Telephone Encounter (Signed)
Please call patient and ask if his CPAP machine is broken. His script for a new machine was denied, I do not see any documentation in the chart that it is broken. If it is not working I will do a peer-peer call to see if we can get a new one for him. Thanks.

## 2015-05-02 NOTE — Telephone Encounter (Signed)
LMTCB for the pt and will hold msg in triage since it has been closed but is not complete

## 2015-05-02 NOTE — Telephone Encounter (Signed)
lmtcb

## 2015-05-03 ENCOUNTER — Ambulatory Visit (AMBULATORY_SURGERY_CENTER): Payer: Self-pay | Admitting: *Deleted

## 2015-05-03 VITALS — Ht 74.0 in | Wt 229.0 lb

## 2015-05-03 DIAGNOSIS — Z1211 Encounter for screening for malignant neoplasm of colon: Secondary | ICD-10-CM

## 2015-05-03 MED ORDER — NA SULFATE-K SULFATE-MG SULF 17.5-3.13-1.6 GM/177ML PO SOLN: ORAL | Status: DC

## 2015-05-03 NOTE — Telephone Encounter (Signed)
[name], MD at 05/02/2015 2:04 PM     Status: Signed       Expand All Collapse All   Please call patient and ask if his CPAP machine is broken. His script for a new machine was denied, I do not see any documentation in the chart that it is broken. If it is not working I will do a peer-peer call to see if we can get a new one for him. Thanks.        Called and spoke to Lance Beard. Lance Beard stated the new machine he has now is about 21 weeks old and is working well, no complaints, no damage. The old machine the Lance Beard had before he had about 7-8 years and has been on therapy for about 10 years.   Dr. Ashby Dawes please advise. Thanks.

## 2015-05-03 NOTE — Progress Notes (Signed)
Patient denies any allergies to eggs or soy. Patient denies any problems with anesthesia/sedation. Patient denies any oxygen use at home and does not take any diet/weight loss medications. EMMI education assisgned to patient on colonoscopy, this was explained and instructions given to patient. 

## 2015-05-04 ENCOUNTER — Telehealth: Payer: Self-pay | Admitting: Internal Medicine

## 2015-05-04 NOTE — Telephone Encounter (Signed)
Returning a call from [facility]

## 2015-05-05 NOTE — Telephone Encounter (Signed)
Spent 25 min on hold with [facility] peer to peer line; received appt with medical director for Monday, 9/12 at 1:20 pm.   Discussed with representative at [facility], they need a statement from home care company that machine is either not working properly or the machine is too old to work with new supplies. Please have [facility] provide documentation in order to get new machine covered and fax to [phone] DR

## 2015-05-05 NOTE — Telephone Encounter (Signed)
Pt states that he was informed by [facility] that [place] would not cover the supplies because it was too old. Pt states machine was 8-53 years old. Will forward to DR for peer to peer with [facility].

## 2015-05-08 NOTE — Telephone Encounter (Signed)
Message sent to [facility] at [facility] Awaiting response from [facility]

## 2015-05-09 NOTE — Telephone Encounter (Signed)
Dr. Ashby Dawes has done the peer to peer and they informed him that [facility] needs to show that supplies are not available for pt's old machine. Spoke with Melissa and she stated that they would have pt to bring in old machine and that [facility], pt's insurance requires machines that are downloadable. She will f/u with pt. Nothing further needed.

## 2015-05-17 ENCOUNTER — Ambulatory Visit (AMBULATORY_SURGERY_CENTER): Payer: Managed Care, Other (non HMO) | Admitting: Internal Medicine

## 2015-05-17 ENCOUNTER — Encounter: Payer: Self-pay | Admitting: Internal Medicine

## 2015-05-17 VITALS — BP 122/72 | HR 59 | Temp 96.9°F | Resp 33 | Ht 74.0 in | Wt 228.0 lb

## 2015-05-17 DIAGNOSIS — D128 Benign neoplasm of rectum: Secondary | ICD-10-CM

## 2015-05-17 DIAGNOSIS — K621 Rectal polyp: Secondary | ICD-10-CM | POA: Diagnosis not present

## 2015-05-17 DIAGNOSIS — D122 Benign neoplasm of ascending colon: Secondary | ICD-10-CM

## 2015-05-17 DIAGNOSIS — D129 Benign neoplasm of anus and anal canal: Secondary | ICD-10-CM

## 2015-05-17 DIAGNOSIS — Z1211 Encounter for screening for malignant neoplasm of colon: Secondary | ICD-10-CM | POA: Diagnosis not present

## 2015-05-17 DIAGNOSIS — D124 Benign neoplasm of descending colon: Secondary | ICD-10-CM

## 2015-05-17 MED ORDER — SODIUM CHLORIDE 0.9 % IV SOLN: 500.0000 mL | INTRAVENOUS | Status: DC

## 2015-05-17 NOTE — Progress Notes (Signed)
Transferred to recovery room. A/O x3, pleased with MAC.  VSS.  Report to Anette, Therapist, sports.

## 2015-05-17 NOTE — Op Note (Signed)
[place]  [address]. [place], [address]   COLONOSCOPY PROCEDURE REPORT  PATIENT: Lance Beard, Lance Beard  MR#: [id number] BIRTHDATE: 1960/09/16 , 29  yrs. old GENDER: male ENDOSCOPIST: Eustace Quail, MD REFERRED AU:QJFH Parke Simmers, M.D. PROCEDURE DATE:  05/17/2015 PROCEDURE:   Colonoscopy, screening and Colonoscopy with snare polypectomy x 4 First Screening Colonoscopy - Avg.  risk and is 50 yrs.  old or older Yes.  Prior Negative Screening - Now for repeat screening. N/A  History of Adenoma - Now for follow-up colonoscopy & has been > or = to 3 yrs.  N/A  Polyps removed today? Yes ASA CLASS:   Class II INDICATIONS:Screening for colonic neoplasia and Colorectal Neoplasm Risk Assessment for this procedure is average risk. MEDICATIONS: Propofol 250 mg IV and Monitored anesthesia care  DESCRIPTION OF PROCEDURE:   After the risks benefits and alternatives of the procedure were thoroughly explained, informed consent was obtained.  The digital rectal exam revealed no abnormalities of the rectum.   The LB [id number]  endoscope was introduced through the anus and advanced to the cecum, which was identified by both the appendix and ileocecal valve. No adverse events experienced.   The quality of the prep was excellent. (Suprep was used)  The instrument was then slowly withdrawn as the colon was fully examined. Estimated blood loss is zero unless otherwise noted in this procedure report.  COLON FINDINGS: Four polyps were found in the rectum(6 mm), descending colon(5 mm, 15 mm), and ascending colon(10 mm).  A polypectomy was performed with a cold snare on the smaller 3 polyps. Hot snare on the largest descending colon polyp.  The resection was complete, the polyp tissue was completely retrieved and sent to histology.   There was moderate diverticulosis noted in the sigmoid colon.   The examination was otherwise normal. Retroflexed views revealed internal  hemorrhoids. The time to cecum = 2.0 Withdrawal time = 14.7   The scope was withdrawn and the procedure completed. COMPLICATIONS: There were no immediate complications.  ENDOSCOPIC IMPRESSION: 1.   Four polyps were found in the colon; polypectomy was performed with a cold and hot snare 2.   Moderate diverticulosis was noted in the sigmoid colon 3.   The examination was otherwise normal  RECOMMENDATIONS: 1. Repeat Colonoscopy in 3 years.  eSigned:  Eustace Quail, MD 05/17/2015 12:44 PM   cc: Emeline General, MD and The Patient

## 2015-05-17 NOTE — Progress Notes (Signed)
Called to room to assist during endoscopic procedure.  Patient ID and intended procedure confirmed with present staff. Received instructions for my participation in the procedure from the performing physician.  

## 2015-05-17 NOTE — Patient Instructions (Signed)
YOU HAD AN ENDOSCOPIC PROCEDURE TODAY AT THE [facility]:   Refer to the procedure report that was given to you for any specific questions about what was found during the examination.  If the procedure report does not answer your questions, please call your gastroenterologist to clarify.  If you requested that your care partner not be given the details of your procedure findings, then the procedure report has been included in a sealed envelope for you to review at your convenience later.  YOU SHOULD EXPECT: Some feelings of bloating in the abdomen. Passage of more gas than usual.  Walking can help get rid of the air that was put into your GI tract during the procedure and reduce the bloating. If you had a lower endoscopy (such as a colonoscopy or flexible sigmoidoscopy) you may notice spotting of blood in your stool or on the toilet paper. If you underwent a bowel prep for your procedure, you may not have a normal bowel movement for a few days.  Please Note:  You might notice some irritation and congestion in your nose or some drainage.  This is from the oxygen used during your procedure.  There is no need for concern and it should clear up in a day or so.  SYMPTOMS TO REPORT IMMEDIATELY:   Following lower endoscopy (colonoscopy or flexible sigmoidoscopy):  Excessive amounts of blood in the stool  Significant tenderness or worsening of abdominal pains  Swelling of the abdomen that is new, acute  Fever of 100F or higher  For urgent or emergent issues, a gastroenterologist can be reached at any hour by calling [phone].   DIET: Your first meal following the procedure should be a small meal and then it is ok to progress to your normal diet. Heavy or fried foods are harder to digest and may make you feel nauseous or bloated.  Likewise, meals heavy in dairy and vegetables can increase bloating.  Drink plenty of fluids but you should avoid alcoholic beverages for 24  hours.  ACTIVITY:  You should plan to take it easy for the rest of today and you should NOT DRIVE or use heavy machinery until tomorrow (because of the sedation medicines used during the test).    FOLLOW UP: Our staff will call the number listed on your records the next business day following your procedure to check on you and address any questions or concerns that you may have regarding the information given to you following your procedure. If we do not reach you, we will leave a message.  However, if you are feeling well and you are not experiencing any problems, there is no need to return our call.  We will assume that you have returned to your regular daily activities without incident.  If any biopsies were taken you will be contacted by phone or by letter within the next 1-3 weeks.  Please call us at [phone] if you have not heard about the biopsies in 3 weeks.    SIGNATURES/CONFIDENTIALITY: You and/or your care partner have signed paperwork which will be entered into your electronic medical record.  These signatures attest to the fact that that the information above on your After Visit Summary has been reviewed and is understood.  Full responsibility of the confidentiality of this discharge information lies with you and/or your care-partner.    Handouts were given to your care partner on polyps, diverticulosis, and a high fiber diet with liberal fluid intake. You may resume your   current medications today. Await biopsy results. Please call if any questions or concerns.   

## 2015-05-18 ENCOUNTER — Telehealth: Payer: Self-pay | Admitting: Emergency Medicine

## 2015-05-18 NOTE — Telephone Encounter (Signed)
  Follow up Call-  Call back number 05/17/2015  Post procedure Call Back phone  # [phone]  Permission to leave phone message Yes     Patient questions:  Do you have a fever, pain , or abdominal swelling? No. Pain Score  0 *  Have you tolerated food without any problems? Yes.    Have you been able to return to your normal activities? Yes.    Do you have any questions about your discharge instructions: Diet   No. Medications  No. Follow up visit  No.  Do you have questions or concerns about your Care? No.  Actions: * If pain score is 4 or above: No action needed, pain <4.

## 2015-05-22 ENCOUNTER — Encounter: Payer: Self-pay | Admitting: Internal Medicine

## 2015-05-30 ENCOUNTER — Other Ambulatory Visit: Payer: Self-pay | Admitting: Internal Medicine

## 2015-06-05 ENCOUNTER — Telehealth: Payer: Self-pay | Admitting: Internal Medicine

## 2015-06-05 NOTE — Telephone Encounter (Signed)
Received a message from patient.      Comments:   [facility] has advised me that [employer] will not cover my replacement equipment unless I have a follow up appointment within 30-90 days of receiving the equipment. I am requesting an appointment prior to the end of that time period.   Pt got machine 04/10/15. Please advise.

## 2015-06-05 NOTE — Telephone Encounter (Signed)
Spoke with pt, needs ov before 11/15 per [facility].  Pt scheduled 11/7 with [facility].  Nothing further needed.

## 2015-07-02 ENCOUNTER — Encounter: Payer: Self-pay | Admitting: Internal Medicine

## 2015-07-03 ENCOUNTER — Ambulatory Visit (INDEPENDENT_AMBULATORY_CARE_PROVIDER_SITE_OTHER): Payer: 59 | Admitting: Internal Medicine

## 2015-07-03 ENCOUNTER — Encounter: Payer: Self-pay | Admitting: Internal Medicine

## 2015-07-03 VITALS — BP 118/58 | HR 60 | Ht 74.0 in | Wt 227.0 lb

## 2015-07-03 DIAGNOSIS — G4733 Obstructive sleep apnea (adult) (pediatric): Secondary | ICD-10-CM | POA: Diagnosis not present

## 2015-07-03 NOTE — Progress Notes (Signed)
* [facility]     Assessment and Plan:  Sleep apnea:  Has has now been set up with a new machine and new supplies.  Continue CPAP.   Excessive daytime sleepiness. Now resolved.     -  Date: 07/03/2015  MRN# [id number] Lance Beard Apr 26, 1961   Lance Beard is a 54 y.o. old male seen in follow up for chief complaint of  No chief complaint on file.    HPI:  The patient is a 54 year old male with a history of obstructive sleep apnea. His AHI is 18.5, he required a CPAP of 11. The patient was previously on CPAP, but his machine was broken. Therefore, he underwent a new study with the results as above. He has now been restarted on CPAP. He is using the cpap every night, for about 6 to 7 hours per night.  He notes that he has been feeling better with cpap, he is not sleepy during the day, and he is no longer snoring.    Review of testing: Reviewed results of most recent sleep study performed on 01/11/2011. The patient was noted to have moderate of sleep apnea with an apnea-hypopnea index of 18.5 he underwent a split night study at that time and was titrated to CPAP pressure of 11, he was also noted to have periodic limb movements with arousals, though the arousal index was 2.5   No flowsheet data found.  Pulmonary Functions Testing Results:  No results found for: FEV1, FVC, FEV1FVC, TLC, DLCO   Medication:   Outpatient Encounter Prescriptions as of 07/03/2015  Medication Sig  . aspirin 81 MG tablet Take 81 mg by mouth daily.    Marland Kitchen atenolol (TENORMIN) 25 MG tablet TAKE 1 TABLET BY MOUTH DAILY.  Marland Kitchen atorvastatin (LIPITOR) 20 MG tablet TAKE 1 TABLET BY MOUTH DAILY  . B Complex-C (B-COMPLEX WITH VITAMIN C) tablet Take 1 tablet by mouth daily.  . hydrocortisone (ANUSOL-HC) 25 MG suppository Place 1 suppository (25 mg total) rectally 2 (two) times daily. (Patient not taking: Reported on 05/03/2015)  . hydrocortisone (PROCTOCREAM-HC) 2.5 % rectal cream Place 1  application rectally 2 (two) times daily. (Patient not taking: Reported on 05/03/2015)   No facility-administered encounter medications on file as of 07/03/2015.     Allergies:  Review of patient's allergies indicates no known allergies.  Review of Systems: Gen:  Denies  fever, sweats. HEENT: Denies blurred vision. Cvc:  No dizziness, chest pain or heaviness Resp:   Denies cough or sputum porduction. Gi: Denies swallowing difficulty, stomach pain. constipation, bowel incontinence Gu:  Denies bladder incontinence, burning urine Ext:   No Joint pain, stiffness. Skin: No skin rash, easy bruising. Endoc:  No polyuria, polydipsia. Psych: No depression, insomnia. Other:  All other systems were reviewed and found to be negative other than what is mentioned in the HPI.   Physical Examination:   VS: There were no vitals taken for this visit.  General Appearance: No distress  Neuro:without focal findings,  speech normal,  HEENT: PERRLA, EOM intact. Pulmonary: normal breath sounds, No wheezing.   CardiovascularNormal S1,S2.  No m/r/g.   Abdomen: Benign, Soft, non-tender. Renal:  No costovertebral tenderness  GU:  Not performed at this time. Endoc: No evident thyromegaly, no signs of acromegaly. Skin:   warm, no rash. Extremities: normal, no cyanosis, clubbing.   LABORATORY PANEL:   CBC No results for input(s): WBC, HGB, HCT, PLT in the last 168 hours. ------------------------------------------------------------------------------------------------------------------  Chemistries  No results  for input(s): NA, K, CL, CO2, GLUCOSE, BUN, CREATININE, CALCIUM, MG, AST, ALT, ALKPHOS, BILITOT in the last 168 hours.  Invalid input(s): GFRCGP ------------------------------------------------------------------------------------------------------------------  Cardiac Enzymes No results for input(s): TROPONINI in the last 168  hours. ------------------------------------------------------------  RADIOLOGY:   No results found for this or any previous visit. Results for orders placed during the hospital encounter of 09/11/12  DG Chest 2 View   Narrative *RADIOLOGY REPORT*  Clinical Data: - Shortness of breath, left arm pain  CHEST - 2 VIEW  Comparison: None.  Findings: No active infiltrate or effusion is seen.  There is mild peribronchial thickening present which may indicate bronchitis. Mediastinal contours are normal.  The heart is within normal limits in size.  No bony abnormality is seen.  IMPRESSION: No pneumonia.  Mild peribronchial thickening may indicate bronchitis.   Original Report Authenticated By: Ivar Drape, M.D.    ------------------------------------------------------------------------------------------------------------------  Thank  you for allowing [facility] to assist in the care of your patient. Our recommendations are noted above.  Please contact us if we can be of further service.   Marda Stalker, MD.  [facility] Office Number: [phone]  Patricia Pesa, M.D.  Vilinda Boehringer, M.D.  Merton Border, M.D

## 2015-07-03 NOTE — Patient Instructions (Signed)
--  Call if there are any issues with using your CPAP.

## 2015-08-08 ENCOUNTER — Other Ambulatory Visit: Payer: Managed Care, Other (non HMO) | Admitting: Internal Medicine

## 2015-08-08 DIAGNOSIS — Z79899 Other long term (current) drug therapy: Secondary | ICD-10-CM

## 2015-08-08 DIAGNOSIS — E785 Hyperlipidemia, unspecified: Secondary | ICD-10-CM

## 2015-08-08 DIAGNOSIS — R7309 Other abnormal glucose: Secondary | ICD-10-CM

## 2015-08-08 DIAGNOSIS — I10 Essential (primary) hypertension: Secondary | ICD-10-CM

## 2015-08-08 LAB — LIPID PANEL
Cholesterol: 79 mg/dL — ABNORMAL LOW (ref 125–200)
HDL: 24 mg/dL — ABNORMAL LOW (ref >=40)
LDL Cholesterol: 38 mg/dL (ref <130)
Total CHOL/HDL Ratio: 3.3 Ratio (ref <=5.0)
Triglycerides: 83 mg/dL (ref <150)
VLDL: 17 mg/dL (ref <30)

## 2015-08-08 LAB — HEPATIC FUNCTION PANEL
ALT: 23 U/L (ref 9–46)
AST: 12 U/L (ref 10–35)
Albumin: 4.1 g/dL (ref 3.6–5.1)
Alkaline Phosphatase: 24 U/L — ABNORMAL LOW (ref 40–115)
BILIRUBIN TOTAL: 0.7 mg/dL (ref 0.2–1.2)
Bilirubin, Direct: 0.2 mg/dL (ref <=0.2)
Indirect Bilirubin: 0.5 mg/dL (ref 0.2–1.2)
Total Protein: 6.1 g/dL (ref 6.1–8.1)

## 2015-08-09 LAB — HEMOGLOBIN A1C
HEMOGLOBIN A1C: 5.5 % (ref <5.7)
MEAN PLASMA GLUCOSE: 111 mg/dL (ref <117)

## 2015-08-10 ENCOUNTER — Ambulatory Visit: Payer: Managed Care, Other (non HMO) | Admitting: Internal Medicine

## 2015-08-11 ENCOUNTER — Encounter: Payer: Self-pay | Admitting: Internal Medicine

## 2015-08-11 ENCOUNTER — Ambulatory Visit (INDEPENDENT_AMBULATORY_CARE_PROVIDER_SITE_OTHER): Payer: 59 | Admitting: Internal Medicine

## 2015-08-11 VITALS — BP 122/62 | HR 65 | Temp 97.2°F | Resp 20 | Ht 74.0 in | Wt 226.0 lb

## 2015-08-11 DIAGNOSIS — H1851 Endothelial corneal dystrophy: Secondary | ICD-10-CM | POA: Diagnosis not present

## 2015-08-11 DIAGNOSIS — I10 Essential (primary) hypertension: Secondary | ICD-10-CM | POA: Diagnosis not present

## 2015-08-11 DIAGNOSIS — E785 Hyperlipidemia, unspecified: Secondary | ICD-10-CM

## 2015-08-11 DIAGNOSIS — R7302 Impaired glucose tolerance (oral): Secondary | ICD-10-CM | POA: Diagnosis not present

## 2015-08-11 DIAGNOSIS — H18519 Endothelial corneal dystrophy, unspecified eye: Secondary | ICD-10-CM | POA: Insufficient documentation

## 2015-08-11 NOTE — Progress Notes (Signed)
   Subjective:    Patient ID: Lance Beard, male    DOB: 1961/06/16, 54 y.o.   MRN: [id number]  HPI Six-month follow-up on impaired glucose tolerance, hypertension and hyperlipidemia. Hemoglobin A1c is stable at 5.5% and was 5.5% 6 months ago. Year ago was 6.2%. He has changed eating habits. He's lost 7 pounds since physical exam which is excellent. Lipid panel liver functions are normal on statin medication. Blood pressure is excellent. No new complaints except for recent diagnosis of Fuchs corneal dystrophy followed at [facility]    Review of Systems     Objective:   Physical Exam Skin warm and dry. Nodes none. Neck is supple without JVD thyromegaly or carotid bruits. Chest clear to auscultation. Cardiac exam regular rate and rhythm normal S1 and S2. Extremities without edema       Assessment & Plan:  Essential  hypertension-stable on current medication  Hyperlipidemia-stable on statin medication  Impaired glucose tolerance-A1c is now normal and has been normal for the past 6 months. Continue diet and exercise efforts.  Fuchs corneal dystrophy-new diagnosis followed at [place]: Flu vaccine up-to-date. Return in 6 months for physical exam. At that time we might be a month ago yearly checks.

## 2015-08-11 NOTE — Patient Instructions (Signed)
Is a pleasure to see you today. Continue same medications and return for physical exam in 6 months. Keep up diet and exercise efforts.

## 2015-08-16 ENCOUNTER — Other Ambulatory Visit: Payer: Self-pay | Admitting: Internal Medicine

## 2015-12-09 ENCOUNTER — Other Ambulatory Visit: Payer: Self-pay | Admitting: Internal Medicine

## 2016-02-23 ENCOUNTER — Other Ambulatory Visit: Payer: Managed Care, Other (non HMO) | Admitting: Internal Medicine

## 2016-02-29 ENCOUNTER — Encounter: Payer: Managed Care, Other (non HMO) | Admitting: Internal Medicine

## 2016-04-16 ENCOUNTER — Other Ambulatory Visit: Payer: Managed Care, Other (non HMO) | Admitting: Internal Medicine

## 2016-04-16 DIAGNOSIS — I10 Essential (primary) hypertension: Secondary | ICD-10-CM

## 2016-04-16 DIAGNOSIS — E669 Obesity, unspecified: Secondary | ICD-10-CM

## 2016-04-16 DIAGNOSIS — R7302 Impaired glucose tolerance (oral): Secondary | ICD-10-CM

## 2016-04-16 DIAGNOSIS — E119 Type 2 diabetes mellitus without complications: Secondary | ICD-10-CM

## 2016-04-16 DIAGNOSIS — E781 Pure hyperglyceridemia: Secondary | ICD-10-CM

## 2016-04-16 DIAGNOSIS — Z0000 Encounter for general adult medical examination without abnormal findings: Secondary | ICD-10-CM

## 2016-04-16 LAB — CBC WITH DIFFERENTIAL/PLATELET
BASOS PCT: 0 %
Basophils Absolute: 0 cells/uL (ref 0–200)
Eosinophils Absolute: 57 cells/uL (ref 15–500)
Eosinophils Relative: 1 %
HEMATOCRIT: 49.6 % (ref 38.5–50.0)
HEMOGLOBIN: 16.8 g/dL (ref 13.2–17.1)
LYMPHS PCT: 29 %
Lymphs Abs: 1653 cells/uL (ref 850–3900)
MCH: 31.8 pg (ref 27.0–33.0)
MCHC: 33.9 g/dL (ref 32.0–36.0)
MCV: 93.9 fL (ref 80.0–100.0)
MPV: 10.9 fL (ref 7.5–12.5)
Monocytes Absolute: 513 cells/uL (ref 200–950)
Monocytes Relative: 9 %
NEUTROS PCT: 61 %
Neutro Abs: 3477 cells/uL (ref 1500–7800)
Platelets: 202 10*3/uL (ref 140–400)
RBC: 5.28 MIL/uL (ref 4.20–5.80)
RDW: 13.5 % (ref 11.0–15.0)
WBC: 5.7 10*3/uL (ref 3.8–10.8)

## 2016-04-16 LAB — LIPID PANEL
CHOLESTEROL: 70 mg/dL — AB (ref 125–200)
HDL: 23 mg/dL — ABNORMAL LOW (ref >=40)
LDL Cholesterol: 31 mg/dL (ref <130)
Total CHOL/HDL Ratio: 3 Ratio (ref <=5.0)
Triglycerides: 81 mg/dL (ref <150)
VLDL: 16 mg/dL (ref <30)

## 2016-04-16 LAB — COMPLETE METABOLIC PANEL WITH GFR
ALBUMIN: 4 g/dL (ref 3.6–5.1)
ALK PHOS: 21 U/L — AB (ref 40–115)
ALT: 25 U/L (ref 9–46)
AST: 12 U/L (ref 10–35)
BUN: 18 mg/dL (ref 7–25)
CALCIUM: 8.8 mg/dL (ref 8.6–10.3)
CO2: 28 mmol/L (ref 20–31)
CREATININE: 1.04 mg/dL (ref 0.70–1.33)
Chloride: 102 mmol/L (ref 98–110)
GFR, Est Non African American: 81 mL/min (ref >=60)
Glucose, Bld: 92 mg/dL (ref 65–99)
Potassium: 4.4 mmol/L (ref 3.5–5.3)
Sodium: 139 mmol/L (ref 135–146)
Total Bilirubin: 0.9 mg/dL (ref 0.2–1.2)
Total Protein: 6.2 g/dL (ref 6.1–8.1)

## 2016-04-17 LAB — MICROALBUMIN / CREATININE URINE RATIO
CREATININE, URINE: 92 mg/dL (ref 20–370)
MICROALB UR: 1.5 mg/dL
MICROALB/CREAT RATIO: 16 ug/mg{creat} (ref <30)

## 2016-04-17 LAB — HEMOGLOBIN A1C
HEMOGLOBIN A1C: 5.2 % (ref <5.7)
MEAN PLASMA GLUCOSE: 103 mg/dL

## 2016-04-17 LAB — PSA: PSA: 0.9 ng/mL (ref <=4.0)

## 2016-04-18 ENCOUNTER — Encounter: Payer: Self-pay | Admitting: Internal Medicine

## 2016-04-18 ENCOUNTER — Ambulatory Visit (INDEPENDENT_AMBULATORY_CARE_PROVIDER_SITE_OTHER): Payer: 59 | Admitting: Internal Medicine

## 2016-04-18 VITALS — BP 126/70 | HR 56 | Temp 97.4°F | Ht 74.0 in | Wt 235.0 lb

## 2016-04-18 DIAGNOSIS — I10 Essential (primary) hypertension: Secondary | ICD-10-CM | POA: Diagnosis not present

## 2016-04-18 DIAGNOSIS — G4733 Obstructive sleep apnea (adult) (pediatric): Secondary | ICD-10-CM

## 2016-04-18 DIAGNOSIS — Z0000 Encounter for general adult medical examination without abnormal findings: Secondary | ICD-10-CM | POA: Diagnosis not present

## 2016-04-18 DIAGNOSIS — E785 Hyperlipidemia, unspecified: Secondary | ICD-10-CM

## 2016-04-18 DIAGNOSIS — Z86010 Personal history of colonic polyps: Secondary | ICD-10-CM

## 2016-04-18 DIAGNOSIS — H1851 Endothelial corneal dystrophy: Secondary | ICD-10-CM | POA: Diagnosis not present

## 2016-04-18 DIAGNOSIS — H18519 Endothelial corneal dystrophy, unspecified eye: Secondary | ICD-10-CM

## 2016-04-18 NOTE — Progress Notes (Signed)
Subjective:    Patient ID: Lance Beard, male    DOB: 12/03/1960, 55 y.o.   MRN: [id number]  HPI 55 year old white male in today for health maintenance exam and evaluation of medical issues. Weight is 235 pounds and previously was 234 pounds June 2016. In June 2015 he weighed 262 pounds. In March 2015 he was 276 pounds. He has significantly changed the way he eats. He feels well. His blood pressure is excellent and his lipid panel is normal.  He has a history of sleep apnea. He has history of hypertension and has been maintained on Tenormin. History of hyperlipidemia and has taken Lipitor. He takes 81 mg of aspirin daily.  In January 2014 he was evaluated by cardiologist for nonspecific chest pain and had a negative nuclear medicine study after having a false positive stress test. 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 Previous Cardiolite study in 2004 was negative. Dr. Annamaria Boots has seen him for sleep apnea in 2007 and C Pap was recommended.  Past medical history: Fractured nose playing ball 1981, appendectomy 1981, surgery for left varicocele 1991, surgery for nasal septal deviation 1984.  Social history: He is married. Has one adopted daughter who recently graduated from college. Wife is a Art therapist in the [employer] school system. She retired but has returned to work part-time. Patient does not smoke or consume alcohol. 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  Family history: Mother in good health. Sister in good health. Father had diabetes, hypertension, coronary artery disease and renal failure and died with complications of multiple myeloma.  Had colonoscopy September 2016 with several tubular adenomas identified.  In June 2015 he was found to have hemoglobin A1c of 6.2%. Through diet and exercise he has brought this down to normal and it is now 5.2%    Review of Systems    Constitutional: Negative.   All other systems reviewed and are negative.      Objective:   Physical Exam  Constitutional: He is oriented to person, place, and time. He appears well-developed and well-nourished. No distress.  HENT:  Head: Normocephalic and atraumatic.  Right Ear: External ear normal.  Left Ear: External ear normal.  Mouth/Throat: Oropharynx is clear and moist. No oropharyngeal exudate.  Eyes: Conjunctivae and EOM are normal. Pupils are equal, round, and reactive to light. Right eye exhibits no discharge. Left eye exhibits no discharge.  Neck: Neck supple. No JVD present. No thyromegaly present.  Cardiovascular: Normal rate, regular rhythm, normal heart sounds and intact distal pulses.   No murmur heard. Pulmonary/Chest: Effort normal and breath sounds normal. No respiratory distress. He has no wheezes. He has no rales. He exhibits no tenderness.  Abdominal: Soft. Bowel sounds are normal. He exhibits no distension and no mass. There is no tenderness. There is no rebound and no guarding.  Genitourinary:  Genitourinary Comments: Prostate normal without nodules  Musculoskeletal: He exhibits no edema.  Lymphadenopathy:    He has no cervical adenopathy.  Neurological: He is alert and oriented to person, place, and time. He has normal reflexes. No cranial nerve deficit. Coordination normal.  Skin: Skin is warm and dry. He is not diaphoretic.  Psychiatric: He has a normal mood and affect. His behavior is normal. Judgment and thought content normal.  Vitals reviewed.         Assessment & Plan:  Essential hypertension-stable  Hyperlipidemia-lipid panel  liver functions were normal on statin medication  Obesity considerably improved with diet exercise and weight loss  History of sleep apnea  History of several tubular adenomas on colonoscopy 2016  Plan: Return in 6 months or as needed. Continue same medications

## 2016-04-18 NOTE — Patient Instructions (Signed)
It was a pleasure to see you today. Congratulation on keeping weight under control. Hemoglobin A1c is normal. Return in 6 months and continue same medications.

## 2016-05-24 ENCOUNTER — Telehealth: Payer: Self-pay | Admitting: Internal Medicine

## 2016-05-24 NOTE — Telephone Encounter (Signed)
Recently met with his nutritionist and when going over his recent lab results from you.  He would like to try to cut back on his cholesterol medication, Lipitor.  At one time, said he took 57m.  He would like to see if he can get back to that dosage since his labs are looking better.  Would you be ok with that at this point since his labs are better?  And, since he's seeing a nutritionist and being followed in that regard, is this ok?    Please advise.    Labs drawn on 04/16/16 are listed below.   08/08/15 Cholesterol was 79.    Cholesterol 125 - 200 mg/dL 70    Triglycerides <150 mg/dL 81   HDL >=40 mg/dL 23    Total CHOL/HDL Ratio <=5.0 Ratio 3.0   VLDL <30 mg/dL 16   LDL Cholesterol <130 mg/dL 31   Comments:   Total Cholesterol/HDL Ratio:CHD Risk             Coronary Heart Disease Risk Table                     Men    Women      1/2 Average Risk       3.4    3.3        Average Risk       5.0    4.4       2X Average Risk       9.6    7.1       3X Average Risk       23.4    11.0  Use the calculated Patient Ratio above and the CHD Risk table  to determine the patient's CHD Risk.   RDuPont

## 2016-05-24 NOTE — Telephone Encounter (Signed)
Lowest dose is 10 mg daily for Lipitor. May cut what he has in half and follow up at next scheduled visit.

## 2016-05-24 NOTE — Telephone Encounter (Signed)
Pt states understanding, has no further questions.

## 2016-06-25 ENCOUNTER — Other Ambulatory Visit: Payer: Self-pay | Admitting: Internal Medicine

## 2016-07-01 NOTE — Progress Notes (Signed)
* [facility]     Assessment and Plan:  Sleep apnea:  Has has now been set up with a new machine and new supplies.  Continue CPAP.  -He is using CPAP, partly 7 hours per night every night for the entire night. He is doing much better, he is feeling much more awake and rested during the day.  Excessive daytime sleepiness. Now resolved.    Date: 07/01/2016  [name] Lance Beard [birth year]   Lance Beard is a 55 y.o. old male seen in follow up for chief complaint of  No chief complaint on file.    HPI:  The patient is a 55 year old male with a history of obstructive sleep apnea. His AHI was 18.5, he required a CPAP of 11.  He is doing very well with sleep apnea, he is using it every night, for prostate 7 hours per night. He is much more rested during the day. He is no longer snoring.  Previous  testing: Sleep study performed on 01/11/2011. The patient was noted to have moderate of sleep apnea with an apnea-hypopnea index of 18.5 he underwent a split night study at that time and was titrated to CPAP pressure of 11, he was also noted to have periodic limb movements with arousals, though the arousal index was 2.5   No flowsheet data found.  Pulmonary Functions Testing Results:  No results found for: FEV1, FVC, FEV1FVC, TLC, DLCO   Medication:   Outpatient Encounter Prescriptions as of 07/02/2016  Medication Sig  . aspirin 81 MG tablet Take 81 mg by mouth daily.    Marland Kitchen atenolol (TENORMIN) 25 MG tablet TAKE 1 TABLET BY MOUTH DAILY.  Marland Kitchen atorvastatin (LIPITOR) 20 MG tablet TAKE 1 TABLET BY MOUTH DAILY  . B Complex-C (B-COMPLEX WITH VITAMIN C) tablet Take 1 tablet by mouth daily.   No facility-administered encounter medications on file as of 07/02/2016.      Allergies:  Patient has no known allergies.  Review of Systems: Gen:  Denies  fever, sweats. HEENT: Denies blurred vision. Cvc:  No dizziness, chest pain or heaviness Resp:   Denies cough or  sputum porduction. Gi: Denies swallowing difficulty, stomach pain. constipation, bowel incontinence Gu:  Denies bladder incontinence, burning urine Ext:   No Joint pain, stiffness. Skin: No skin rash, easy bruising. Endoc:  No polyuria, polydipsia. Psych: No depression, insomnia. Other:  All other systems were reviewed and found to be negative other than what is mentioned in the HPI.   Physical Examination:   VS: BP 132/70 (BP Location: Left Arm, Cuff Size: Normal)   Pulse 76   Ht 6\' 2"  (1.88 m)   Wt 240 lb (108.9 kg)   SpO2 97%   BMI 30.81 kg/m   General Appearance: No distress  Neuro:without focal findings,  speech normal,  HEENT: PERRLA, EOM intact. Pulmonary: normal breath sounds, No wheezing.   CardiovascularNormal S1,S2.  No m/r/g.   Abdomen: Benign, Soft, non-tender. Renal:  No costovertebral tenderness  GU:  Not performed at this time. Endoc: No evident thyromegaly, no signs of acromegaly. Skin:   warm, no rash. Extremities: normal, no cyanosis, clubbing.   LABORATORY PANEL:   CBC No results for input(s): WBC, HGB, HCT, PLT in the last 168 hours. ------------------------------------------------------------------------------------------------------------------  Chemistries  No results for input(s): NA, K, CL, CO2, GLUCOSE, BUN, CREATININE, CALCIUM, MG, AST, ALT, ALKPHOS, BILITOT in the last 168 hours.  Invalid input(s): GFRCGP ------------------------------------------------------------------------------------------------------------------  Cardiac Enzymes No results for input(s):  TROPONINI in the last 168 hours. ------------------------------------------------------------  RADIOLOGY:   No results found for this or any previous visit. Results for orders placed during the hospital encounter of 09/11/12  DG Chest 2 View   Narrative *RADIOLOGY REPORT*  Clinical Data: - Shortness of breath, left arm pain  CHEST - 2 VIEW  Comparison: None.  Findings: No  active infiltrate or effusion is seen.  There is mild peribronchial thickening present which may indicate bronchitis. Mediastinal contours are normal.  The heart is within normal limits in size.  No bony abnormality is seen.  IMPRESSION: No pneumonia.  Mild peribronchial thickening may indicate bronchitis.   Original Report Authenticated By: Ivar Drape, M.D.    ------------------------------------------------------------------------------------------------------------------  Thank  you for allowing [facility] to assist in the care of your patient. Our recommendations are noted above.  Please contact Lance Beard if we can be of further service.   Marda Stalker, MD.  [facility] Office Number: [phone]  Patricia Pesa, M.D.  Vilinda Boehringer, M.D.  Merton Border, M.D

## 2016-07-02 ENCOUNTER — Encounter: Payer: Self-pay | Admitting: Internal Medicine

## 2016-07-02 ENCOUNTER — Ambulatory Visit (INDEPENDENT_AMBULATORY_CARE_PROVIDER_SITE_OTHER): Payer: 59 | Admitting: Internal Medicine

## 2016-07-02 VITALS — BP 132/70 | HR 76 | Ht 74.0 in | Wt 240.0 lb

## 2016-07-02 DIAGNOSIS — G4733 Obstructive sleep apnea (adult) (pediatric): Secondary | ICD-10-CM

## 2016-07-02 NOTE — Patient Instructions (Addendum)
Continue to use your cpap every night for the entire night.   

## 2016-09-03 ENCOUNTER — Other Ambulatory Visit: Payer: Self-pay | Admitting: Internal Medicine

## 2016-09-16 ENCOUNTER — Encounter: Payer: Self-pay | Admitting: Internal Medicine

## 2016-09-16 ENCOUNTER — Ambulatory Visit (INDEPENDENT_AMBULATORY_CARE_PROVIDER_SITE_OTHER): Payer: Managed Care, Other (non HMO) | Admitting: Internal Medicine

## 2016-09-16 VITALS — BP 110/66 | HR 68 | Temp 98.2°F | Wt 242.0 lb

## 2016-09-16 DIAGNOSIS — R319 Hematuria, unspecified: Secondary | ICD-10-CM

## 2016-09-16 DIAGNOSIS — N200 Calculus of kidney: Secondary | ICD-10-CM | POA: Diagnosis not present

## 2016-09-16 DIAGNOSIS — M545 Low back pain: Secondary | ICD-10-CM | POA: Diagnosis not present

## 2016-09-16 LAB — POC URINALSYSI DIPSTICK (AUTOMATED)
Bilirubin, UA: NEGATIVE
Glucose, UA: NEGATIVE
Ketones, UA: NEGATIVE
LEUKOCYTES UA: NEGATIVE
NITRITE UA: NEGATIVE
PROTEIN UA: NEGATIVE
Spec Grav, UA: 1.015
UROBILINOGEN UA: NEGATIVE
pH, UA: 6.5

## 2016-09-16 NOTE — Progress Notes (Signed)
   Subjective:    Patient ID: Lance Beard, male    DOB: 05-29-1961, 56 y.o.   MRN: [id number]  HPI 56 year old Male who had an apparent viral syndrome about a week ago with nausea and diarrhea. A couple days ago began to have some very slight dysuria. Passed kidney stone last night. Stone sent for analysis. Urine dipstick shows blood. He has not had frank hematuria.  Urine culture sent. No nausea and vomiting at the present time. No previous kidney stones.  No Family history of kidney stones. Some 3 weeks ago around the [place], had some right flank pain and did not feel all that well.    Review of Systems     Objective:   Physical Exam  No CVA tenderness. Urine dipstick shows hematuria. Afebrile     Assessment & Plan:  Hematuria  Kidney stone  Plan: Stone sent for analysis. Urine sent for culture.CT without contrast later this week.

## 2016-09-16 NOTE — Patient Instructions (Signed)
Drink plenty of water. Watch consumption of tea and soft drinks. Stone sent for analysis. To have CT to check for additional stones.

## 2016-09-17 LAB — URINE CULTURE: Organism ID, Bacteria: NO GROWTH

## 2016-09-19 ENCOUNTER — Ambulatory Visit
Admission: RE | Admit: 2016-09-19 | Discharge: 2016-09-19 | Disposition: A | Discharge status: Home / Self Care | Payer: Managed Care, Other (non HMO) | Source: Ambulatory Visit | Attending: Internal Medicine | Admitting: Internal Medicine

## 2016-09-19 DIAGNOSIS — N200 Calculus of kidney: Secondary | ICD-10-CM

## 2016-09-19 LAB — STONE ANALYSIS: STONE WEIGHT KSTONE: 0.004 g

## 2016-09-23 DIAGNOSIS — N202 Calculus of kidney with calculus of ureter: Secondary | ICD-10-CM | POA: Diagnosis not present

## 2016-09-24 DIAGNOSIS — G4733 Obstructive sleep apnea (adult) (pediatric): Secondary | ICD-10-CM | POA: Diagnosis not present

## 2016-10-15 ENCOUNTER — Other Ambulatory Visit: Payer: Managed Care, Other (non HMO) | Admitting: Internal Medicine

## 2016-10-15 DIAGNOSIS — Z79899 Other long term (current) drug therapy: Secondary | ICD-10-CM

## 2016-10-15 DIAGNOSIS — E781 Pure hyperglyceridemia: Secondary | ICD-10-CM

## 2016-10-15 LAB — HEPATIC FUNCTION PANEL
ALK PHOS: 23 U/L — AB (ref 40–115)
ALT: 26 U/L (ref 9–46)
AST: 13 U/L (ref 10–35)
Albumin: 4.1 g/dL (ref 3.6–5.1)
BILIRUBIN DIRECT: 0.2 mg/dL (ref <=0.2)
BILIRUBIN INDIRECT: 0.6 mg/dL (ref 0.2–1.2)
Total Bilirubin: 0.8 mg/dL (ref 0.2–1.2)
Total Protein: 6.4 g/dL (ref 6.1–8.1)

## 2016-10-15 LAB — LIPID PANEL
CHOLESTEROL: 82 mg/dL (ref <200)
HDL: 22 mg/dL — ABNORMAL LOW (ref >40)
LDL Cholesterol: 38 mg/dL (ref <100)
TRIGLYCERIDES: 112 mg/dL (ref <150)
Total CHOL/HDL Ratio: 3.7 Ratio (ref <5.0)
VLDL: 22 mg/dL (ref <30)

## 2016-10-17 ENCOUNTER — Encounter: Payer: Self-pay | Admitting: Internal Medicine

## 2016-10-17 ENCOUNTER — Ambulatory Visit (INDEPENDENT_AMBULATORY_CARE_PROVIDER_SITE_OTHER): Payer: Managed Care, Other (non HMO) | Admitting: Internal Medicine

## 2016-10-17 VITALS — BP 120/60 | HR 58 | Temp 97.8°F | Ht 74.0 in | Wt 246.0 lb

## 2016-10-17 DIAGNOSIS — E785 Hyperlipidemia, unspecified: Secondary | ICD-10-CM | POA: Diagnosis not present

## 2016-10-17 DIAGNOSIS — Z87442 Personal history of urinary calculi: Secondary | ICD-10-CM | POA: Diagnosis not present

## 2016-10-17 DIAGNOSIS — I10 Essential (primary) hypertension: Secondary | ICD-10-CM

## 2016-10-17 NOTE — Progress Notes (Signed)
   Subjective:    Patient ID: Lance Beard, male    DOB: March 22, 1961, 56 y.o.   MRN: [id number]  HPI   56 year old Male  for 6 month recheck On hypertension and hyperlipidemia. Blood pressures under excellent control at 120/60. Lipid panel and liver functions are normal.  He is being followed by urology for kidney stone. Says he's been told he has a 60-70% chance of passing the stone. No new complaints. Feels okay.  He has gained some weight since August 2018. At that time he weighed 235 pounds. Now weighs 246 pounds. He'll try to work on this in the meantime before his next physical exam.  He is on Lipitor 10 mg daily and atenolol 25 mg daily. He takes baby aspirin daily.       Review of Systems see above     Objective:   Physical Exam Skin warm and dry. Nodes none. Neck supple without JVD thyromegaly or carotid bruits. Chest clear. Cardiac exam regular rate and rhythm normal S1 and S2. Extremities without edema.       Assessment & Plan:  Essential hypertension  Hyperlipidemia  Weight gain  Plan: Work on diet exercise and weight loss. Continue same medications. Lipid panel liver functions are within normal limits. Return in 6 months for physical exam.

## 2016-10-17 NOTE — Patient Instructions (Signed)
It was a pleasure to see you today. Continue same medications and return late August early September for physical exam. Work on diet exercise and weight loss.

## 2016-10-30 DIAGNOSIS — N202 Calculus of kidney with calculus of ureter: Secondary | ICD-10-CM | POA: Diagnosis not present

## 2016-11-27 DIAGNOSIS — N202 Calculus of kidney with calculus of ureter: Secondary | ICD-10-CM | POA: Diagnosis not present

## 2016-12-05 ENCOUNTER — Encounter: Payer: Self-pay | Admitting: Internal Medicine

## 2016-12-05 ENCOUNTER — Ambulatory Visit (INDEPENDENT_AMBULATORY_CARE_PROVIDER_SITE_OTHER): Payer: 59 | Admitting: Internal Medicine

## 2016-12-05 VITALS — BP 120/70 | HR 62 | Temp 98.1°F | Wt 249.0 lb

## 2016-12-05 DIAGNOSIS — I10 Essential (primary) hypertension: Secondary | ICD-10-CM

## 2016-12-05 DIAGNOSIS — R0789 Other chest pain: Secondary | ICD-10-CM

## 2016-12-05 DIAGNOSIS — R002 Palpitations: Secondary | ICD-10-CM

## 2016-12-05 NOTE — Addendum Note (Signed)
Addended by: Inocencio Homes on: 12/05/2016 11:14 AM   Modules accepted: Orders

## 2016-12-05 NOTE — Patient Instructions (Signed)
Cardiology referral to be made. Discontinue Flomax for a few days and monitor blood pressure.

## 2016-12-05 NOTE — Progress Notes (Signed)
   Subjective:    Patient ID: Lance Beard, male    DOB: [birth year], 56 y.o.   MRN: [id number]  HPI   56  year old Male says his blood pressure was elevated at the urology office in the 701 range systolically recently. Denies being stressed at the time. He was being seen for kidney stone. He is now on Flomax. At times is felt some palpitations. Working on a wall more recently he bent over and stood back up and felt a bit lightheaded. He has no significant chest pain although he describes some "soreness " in his left anterior chest and shoulder area. He has gained weight over the past few months. He does see a Microbiologist.  Family history of heart disease in his father who had CABG.  He is on low-dose Tenormin for mild hypertension. He takes Lipitor.    Review of Systems denies being stressed or anxious     Objective:   Physical Exam Skin warm and dry. Nodes none. Neck supple without JVD thyromegaly or carotid bruits. Chest clear to auscultation. Cardiac exam regular rate and rhythm normal S1 and S2 without murmurs gallops rubs or clicks. Extremities without edema. Blood pressure lying is 110/70. Blood pressure standing 140/70.  EKG shows no acute changes.       Assessment & Plan:  History of blood pressure fluctuation? Labile hypertension  History of kidney stone treated with Flomax. Lomax can cause orthostasis the patient does not appear to be orthostatic today  Plan: I've asked him to discontinue Flomax for a few days and monitor his blood pressure. He says he had a stress test about 4 years ago. I think he is worried about occult coronary disease so we will refer him back to cardiology for evaluation.

## 2016-12-23 ENCOUNTER — Other Ambulatory Visit: Payer: Self-pay | Admitting: Internal Medicine

## 2016-12-31 DIAGNOSIS — N202 Calculus of kidney with calculus of ureter: Secondary | ICD-10-CM | POA: Diagnosis not present

## 2017-02-07 DIAGNOSIS — G4733 Obstructive sleep apnea (adult) (pediatric): Secondary | ICD-10-CM | POA: Diagnosis not present

## 2017-04-09 DIAGNOSIS — N200 Calculus of kidney: Secondary | ICD-10-CM | POA: Diagnosis not present

## 2017-05-01 ENCOUNTER — Encounter: Payer: Self-pay | Admitting: Internal Medicine

## 2017-05-01 ENCOUNTER — Ambulatory Visit (INDEPENDENT_AMBULATORY_CARE_PROVIDER_SITE_OTHER): Payer: 59 | Admitting: Internal Medicine

## 2017-05-01 VITALS — BP 130/78 | HR 68 | Temp 98.2°F | Wt 252.0 lb

## 2017-05-01 DIAGNOSIS — L739 Follicular disorder, unspecified: Secondary | ICD-10-CM

## 2017-05-01 DIAGNOSIS — E785 Hyperlipidemia, unspecified: Secondary | ICD-10-CM

## 2017-05-01 DIAGNOSIS — Z87442 Personal history of urinary calculi: Secondary | ICD-10-CM | POA: Diagnosis not present

## 2017-05-01 DIAGNOSIS — I10 Essential (primary) hypertension: Secondary | ICD-10-CM

## 2017-05-01 MED ORDER — CLOTRIMAZOLE-BETAMETHASONE 1-0.05 % EX CREA: 1.0000 "application " | TOPICAL_CREAM | Freq: Two times a day (BID) | CUTANEOUS | 1 refills | Status: DC

## 2017-05-01 NOTE — Patient Instructions (Addendum)
Lotrisone cream bid to rash until healed.

## 2017-05-01 NOTE — Progress Notes (Signed)
   Subjective:    Patient ID: Lance Beard, male    DOB: 1961/06/03, 56 y.o.   MRN: [id number]  HPI  56 year old Male with itchy rash anterior abdomen and back area. Leaving this weekend to go on vacation to [place]. Has tried over-the-counter preparations without any relief.    Review of Systems see above     Objective:   Physical Exam  He has what appears to be folliculitis with erythema about hair follicles on anterior abdomen and back.      Assessment & Plan:  Folliculitis  Plan:Lotrisone cream to apply sparingly twice daily until healed

## 2017-05-06 ENCOUNTER — Other Ambulatory Visit: Payer: Managed Care, Other (non HMO) | Admitting: Internal Medicine

## 2017-05-08 ENCOUNTER — Encounter: Payer: Managed Care, Other (non HMO) | Admitting: Internal Medicine

## 2017-05-16 ENCOUNTER — Other Ambulatory Visit: Payer: 59 | Admitting: Internal Medicine

## 2017-05-16 DIAGNOSIS — R7302 Impaired glucose tolerance (oral): Secondary | ICD-10-CM | POA: Diagnosis not present

## 2017-05-16 DIAGNOSIS — E669 Obesity, unspecified: Secondary | ICD-10-CM

## 2017-05-16 DIAGNOSIS — I10 Essential (primary) hypertension: Secondary | ICD-10-CM | POA: Diagnosis not present

## 2017-05-16 DIAGNOSIS — Z0000 Encounter for general adult medical examination without abnormal findings: Secondary | ICD-10-CM

## 2017-05-16 DIAGNOSIS — E781 Pure hyperglyceridemia: Secondary | ICD-10-CM | POA: Diagnosis not present

## 2017-05-16 DIAGNOSIS — Z125 Encounter for screening for malignant neoplasm of prostate: Secondary | ICD-10-CM

## 2017-05-17 LAB — CBC WITH DIFFERENTIAL/PLATELET
BASOS PCT: 0.6 %
Basophils Absolute: 39 cells/uL (ref 0–200)
EOS PCT: 1.4 %
Eosinophils Absolute: 91 cells/uL (ref 15–500)
HCT: 49.2 % (ref 38.5–50.0)
Hemoglobin: 16.7 g/dL (ref 13.2–17.1)
LYMPHS ABS: 1794 {cells}/uL (ref 850–3900)
MCH: 31.3 pg (ref 27.0–33.0)
MCHC: 33.9 g/dL (ref 32.0–36.0)
MCV: 92.1 fL (ref 80.0–100.0)
MPV: 9.9 fL (ref 7.5–12.5)
Monocytes Relative: 9.3 %
NEUTROS PCT: 61.1 %
Neutro Abs: 3972 cells/uL (ref 1500–7800)
PLATELETS: 211 10*3/uL (ref 140–400)
RBC: 5.34 10*6/uL (ref 4.20–5.80)
RDW: 13.3 % (ref 11.0–15.0)
TOTAL LYMPHOCYTE: 27.6 %
WBC: 6.5 10*3/uL (ref 3.8–10.8)
WBCMIX: 605 {cells}/uL (ref 200–950)

## 2017-05-17 LAB — LIPID PANEL
CHOL/HDL RATIO: 3.9 (calc) (ref <5.0)
CHOLESTEROL: 86 mg/dL (ref <200)
HDL: 22 mg/dL — AB (ref >40)
LDL Cholesterol (Calc): 40 mg/dL (calc)
NON-HDL CHOLESTEROL (CALC): 64 mg/dL (ref <130)
Triglycerides: 161 mg/dL — ABNORMAL HIGH (ref <150)

## 2017-05-17 LAB — COMPLETE METABOLIC PANEL WITH GFR
AG Ratio: 1.9 (calc) (ref 1.0–2.5)
ALKALINE PHOSPHATASE (APISO): 23 U/L — AB (ref 40–115)
ALT: 21 U/L (ref 9–46)
AST: 13 U/L (ref 10–35)
Albumin: 4.2 g/dL (ref 3.6–5.1)
BUN: 17 mg/dL (ref 7–25)
CALCIUM: 8.9 mg/dL (ref 8.6–10.3)
CO2: 28 mmol/L (ref 20–32)
CREATININE: 1.21 mg/dL (ref 0.70–1.33)
Chloride: 103 mmol/L (ref 98–110)
GFR, EST NON AFRICAN AMERICAN: 67 mL/min/{1.73_m2} (ref >=60)
GFR, Est African American: 78 mL/min/{1.73_m2} (ref >=60)
GLOBULIN: 2.2 g/dL (ref 1.9–3.7)
Glucose, Bld: 109 mg/dL — ABNORMAL HIGH (ref 65–99)
POTASSIUM: 4.5 mmol/L (ref 3.5–5.3)
SODIUM: 138 mmol/L (ref 135–146)
Total Bilirubin: 0.6 mg/dL (ref 0.2–1.2)
Total Protein: 6.4 g/dL (ref 6.1–8.1)

## 2017-05-17 LAB — PSA: PSA: 1.5 ng/mL (ref <=4.0)

## 2017-05-17 LAB — HEMOGLOBIN A1C
Hgb A1c MFr Bld: 5.5 % of total Hgb (ref <5.7)
Mean Plasma Glucose: 111 (calc)
eAG (mmol/L): 6.2 (calc)

## 2017-05-17 LAB — MICROALBUMIN / CREATININE URINE RATIO
CREATININE, URINE: 195 mg/dL (ref 20–320)
Microalb Creat Ratio: 3 mcg/mg creat (ref <30)
Microalb, Ur: 0.6 mg/dL

## 2017-05-19 ENCOUNTER — Encounter: Payer: Self-pay | Admitting: Internal Medicine

## 2017-05-19 ENCOUNTER — Ambulatory Visit (INDEPENDENT_AMBULATORY_CARE_PROVIDER_SITE_OTHER): Payer: 59 | Admitting: Internal Medicine

## 2017-05-19 VITALS — BP 120/76 | HR 68 | Temp 97.8°F | Ht 73.0 in | Wt 252.0 lb

## 2017-05-19 DIAGNOSIS — I10 Essential (primary) hypertension: Secondary | ICD-10-CM | POA: Diagnosis not present

## 2017-05-19 DIAGNOSIS — G4733 Obstructive sleep apnea (adult) (pediatric): Secondary | ICD-10-CM | POA: Diagnosis not present

## 2017-05-19 DIAGNOSIS — Z0000 Encounter for general adult medical examination without abnormal findings: Secondary | ICD-10-CM

## 2017-05-19 DIAGNOSIS — R7302 Impaired glucose tolerance (oral): Secondary | ICD-10-CM

## 2017-05-19 DIAGNOSIS — Z6833 Body mass index (BMI) 33.0-33.9, adult: Secondary | ICD-10-CM

## 2017-05-19 DIAGNOSIS — Z87442 Personal history of urinary calculi: Secondary | ICD-10-CM

## 2017-05-19 DIAGNOSIS — E785 Hyperlipidemia, unspecified: Secondary | ICD-10-CM | POA: Diagnosis not present

## 2017-05-19 DIAGNOSIS — Z86010 Personal history of colonic polyps: Secondary | ICD-10-CM

## 2017-05-19 DIAGNOSIS — H18519 Endothelial corneal dystrophy, unspecified eye: Secondary | ICD-10-CM

## 2017-05-19 DIAGNOSIS — H1851 Endothelial corneal dystrophy: Secondary | ICD-10-CM

## 2017-05-19 NOTE — Patient Instructions (Addendum)
It was a pleasure to see you today. Continue same medications and return in 6 months. Did have flu vaccine through employment. Please work on diet and exercise regimen.

## 2017-05-19 NOTE — Progress Notes (Signed)
Subjective:    Patient ID: Lance Beard, male    DOB: 1960-08-31, 56 y.o.   MRN: [id number]  HPI 56 year old White Male in today for health maintenance exam and evaluation of medical issues. He has a history of hypertension and hyperlipidemia. These issues are under good control. History of impaired glucose tolerance but hemoglobin A1c is normal. In January he passed kidney stone which was analyzed and was found to be calcium oxalate. He saw urologist. He's had no further recurrence.  Lipid panel done in conjunction with this visit shows triglycerides of 161 and previously were 112. 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 Not watching his diet quite as much. Fasting glucose 109. Hemoglobin A1c 5.5%.  Had colonoscopy 2016 by Dr. Henrene Pastor with 3 tubular adenomas removed.  In June 2015 he weighed 262 pounds. In March 2015 he weighed 276 pounds. In 2017 he weighed 235 pounds. Has gained 17 pounds in the past year.  He has a history of sleep apnea.  In January 2014 he was evaluated by cardiologist for nonspecific chest pain and had a negative nuclear medicine study after having a false positive stress test. 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 Previous Cardiolite study 2004 was negative. Dr. Annamaria Boots has seen him for sleep apnea in 2007 and see Pap was recommended.  Past medical history: Fractured nose playing ball 1981, appendectomy 1981, surgery for left varicocele 1991, surgery for nasal septal deviation 1984.  Social history: He is married. One adopted daughter who recently graduated from college and will be getting married in May. Wife is a Art therapist in the [employer] school system. She is retired but has returned to work part-time. Patient does not smoke or consume alcohol. 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  Family history: Mother in good health. Sister in  good health. Father deceased with diabetes hypertension coronary artery disease and renal failure. He died with complications of multiple myeloma.  In June 2015 he was found to have a hemoglobin A1c of 6.2%. Through diet and exercise this is improved.     Review of Systems  Constitutional: Negative.   Respiratory: Negative.   Cardiovascular: Negative.   Gastrointestinal: Negative.   Neurological: Negative.   Psychiatric/Behavioral: Negative.   All other systems reviewed and are negative.      Objective:   Physical Exam  Constitutional: He is oriented to person, place, and time.  Neck: Neck supple.  Cardiovascular: Normal rate, regular rhythm, normal heart sounds and intact distal pulses.   No murmur heard. Abdominal: Bowel sounds are normal.  Genitourinary: Prostate normal.  Musculoskeletal: He exhibits no edema.  Neurological: He is alert and oriented to person, place, and time. He has normal reflexes. Coordination normal.  Skin: Skin is warm and dry. No rash noted.  Psychiatric: He has a normal mood and affect. His behavior is normal. Judgment and thought content normal.  Vitals reviewed.         Assessment & Plan:  BMI 33.25-work on diet and exercise regimen  Hyperlipidemia-stable with statin medication  Essential hypertension-stable medication  History of kidney stones-seen by urologist with no further recurrence. Stone was calcium oxalate. Watch soft drinks and ice tea.  History of adenomatous colon polyps 2016  History of impaired glucose tolerance-normal hemoglobin A1c  Weight gain of 17 pounds since last year. Patient agrees to work on diet and exercise plan  after election in November.  Plan: Continue same medications and return in 6 months. Patient will receive flu vaccine through employment.

## 2017-06-11 DIAGNOSIS — S86819A Strain of other muscle(s) and tendon(s) at lower leg level, unspecified leg, initial encounter: Secondary | ICD-10-CM | POA: Diagnosis not present

## 2017-06-22 ENCOUNTER — Other Ambulatory Visit: Payer: Self-pay | Admitting: Internal Medicine

## 2017-07-03 ENCOUNTER — Encounter: Payer: Self-pay | Admitting: Internal Medicine

## 2017-07-03 ENCOUNTER — Ambulatory Visit (INDEPENDENT_AMBULATORY_CARE_PROVIDER_SITE_OTHER): Payer: 59 | Admitting: Internal Medicine

## 2017-07-03 VITALS — BP 128/70 | HR 60 | Temp 97.6°F | Wt 254.0 lb

## 2017-07-03 DIAGNOSIS — M7989 Other specified soft tissue disorders: Secondary | ICD-10-CM | POA: Diagnosis not present

## 2017-07-03 DIAGNOSIS — M79604 Pain in right leg: Secondary | ICD-10-CM

## 2017-07-03 DIAGNOSIS — M79661 Pain in right lower leg: Secondary | ICD-10-CM | POA: Diagnosis not present

## 2017-07-03 MED ORDER — LEVOFLOXACIN 500 MG PO TABS: 500.0000 mg | ORAL_TABLET | Freq: Every day | ORAL | 0 refills | Status: DC

## 2017-07-03 NOTE — Progress Notes (Signed)
   Subjective:    Patient ID: Lance Beard, male    DOB: 09/13/1960, 56 y.o.   MRN: [id number]  HPI About 3 weeks ago patient was putting a political signs and stepped in a hole.  He is acutely dorsiflexed his right foot.  He heard a pop in his upper calf area.  He  went to [facility] October 17 where their records indicate date of injury was October 16.  He tried wrapping the leg.  It continued to swell and not improve.  He has been on his feet a lot campaigning for [employer].  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  Just yesterday he noticed some discoloration in his lower legs .  Urgent care thought he had a gastrocnemius strain that was mild and prescribed ibuprofen for him.   Review of Systems see above    Objective:   Physical Exam He has significant swelling of his right lower extremity from his knee down to his foot.  He has swelling about his ankle particularly the lateral malleolus.  This is nonpitting.  Calf is tight.  There is discoloration about the mid to lower right leg.  However it is not warm to touch.  There appears to be petechiae with this discoloration.  My feeling is this may represent a torn muscle but cannot exclude DVT.  He does not have a lot of tenderness in his leg.  Unfortunately we cannot get an appointment today for vascular ultrasound.       Assessment & Plan:  Right lower extremity swelling?  DVT versus torn muscle.  He may also have a mild cellulitis of the right lower leg.  Plan: He is going to start on Levaquin 500 mg daily for 7 days.  I would like for him to stay off of his leg is much as possible and keep it elevated.  [facility].

## 2017-07-03 NOTE — Patient Instructions (Signed)
Levaquin 500 mg daily for 7 days.  Keep leg elevated.  Have Doppler study tomorrow at 8 AM.

## 2017-07-04 ENCOUNTER — Telehealth (HOSPITAL_COMMUNITY): Payer: Self-pay | Admitting: Internal Medicine

## 2017-07-04 ENCOUNTER — Ambulatory Visit (HOSPITAL_COMMUNITY)
Admission: RE | Admit: 2017-07-04 | Discharge: 2017-07-04 | Disposition: A | Discharge status: Home / Self Care | Payer: 59 | Source: Ambulatory Visit | Attending: Vascular Surgery | Admitting: Vascular Surgery

## 2017-07-04 DIAGNOSIS — M79661 Pain in right lower leg: Secondary | ICD-10-CM | POA: Insufficient documentation

## 2017-07-04 NOTE — Telephone Encounter (Signed)
Attempted to contact MD's office to give preliminary report of negative DVT study and the office was not open yet.

## 2017-07-09 ENCOUNTER — Telehealth: Payer: Self-pay

## 2017-07-09 NOTE — Telephone Encounter (Signed)
[facility] to confirm pts study and attempted to call pt in order to tell him it was negative and is consistent with a muscle tear like discussed at urgent care. No other things can be done besides physical therapy maybe, but it will take a few more weeks to heal

## 2017-07-09 NOTE — Telephone Encounter (Signed)
Pt is aware and stated that is doing better now, and he is not concerned at this time.

## 2017-07-28 ENCOUNTER — Other Ambulatory Visit: Payer: Self-pay | Admitting: Internal Medicine

## 2017-08-25 DIAGNOSIS — G4733 Obstructive sleep apnea (adult) (pediatric): Secondary | ICD-10-CM | POA: Diagnosis not present

## 2017-08-26 HISTORY — PX: COLONOSCOPY: SHX174

## 2017-09-05 DIAGNOSIS — G4733 Obstructive sleep apnea (adult) (pediatric): Secondary | ICD-10-CM | POA: Diagnosis not present

## 2017-09-18 DIAGNOSIS — G4733 Obstructive sleep apnea (adult) (pediatric): Secondary | ICD-10-CM | POA: Diagnosis not present

## 2017-09-22 DIAGNOSIS — J0100 Acute maxillary sinusitis, unspecified: Secondary | ICD-10-CM | POA: Diagnosis not present

## 2017-09-25 ENCOUNTER — Other Ambulatory Visit: Payer: Self-pay | Admitting: Internal Medicine

## 2017-09-26 DIAGNOSIS — H66009 Acute suppurative otitis media without spontaneous rupture of ear drum, unspecified ear: Secondary | ICD-10-CM | POA: Diagnosis not present

## 2017-09-26 DIAGNOSIS — J019 Acute sinusitis, unspecified: Secondary | ICD-10-CM | POA: Diagnosis not present

## 2017-10-05 DIAGNOSIS — H6692 Otitis media, unspecified, left ear: Secondary | ICD-10-CM | POA: Diagnosis not present

## 2017-10-19 NOTE — Progress Notes (Signed)
* [facility]     Assessment and Plan:  Sleep apnea:  -He is using CPAP nearly 7 hours per night every night for the entire night. He is doing much better, he is feeling much more awake and rested during the day.  Excessive daytime sleepiness. Now resolved.   Return if symptoms worsen or fail to improve.   Date: 10/19/2017  MRN# [id number] Lance Beard 01-13-1961   Lance Beard is a 57 y.o. old male seen in follow up for chief complaint of  Chief Complaint  Patient presents with  . Sleep Apnea    1 year f/u DME:[facility] pt reports 6-7 hrs nightly and is not having any issues.     HPI:  The patient is a 57 year old male with a history of obstructive sleep apnea. His AHI was 18.5, he required a CPAP of 11.  He is doing very well with sleep apnea, he is using it every night. He is feeling rested after sleeping with it.  He is no longer snoring. He got soclean cleaner and is doing well.   Review of download tracings and raw data, 30 days as of 10/16/17.  Usage greater than 4 hours is 29/30 days.  Average usage on days used 6 hours 29 minutes.  Set pressure is 11.  Residual AHI is 0.3.  Previous  testing: Sleep study performed on 01/11/2011. The patient was noted to have moderate of sleep apnea with an apnea-hypopnea index of 18.5 he underwent a split night study at that time and was titrated to CPAP pressure of 11, he was also noted to have periodic limb movements with arousals, though the arousal index was 2.5   Medication:   Outpatient Encounter Medications as of 10/20/2017  Medication Sig  . aspirin 81 MG tablet Take 81 mg by mouth daily.    Marland Kitchen atenolol (TENORMIN) 25 MG tablet TAKE 1 TABLET BY MOUTH EVERY DAY  . atorvastatin (LIPITOR) 20 MG tablet TAKE 1 TABLET BY MOUTH DAILY  . B Complex-C (B-COMPLEX WITH VITAMIN C) tablet Take 1 tablet by mouth daily.  . clotrimazole-betamethasone (LOTRISONE) cream APPLY TO AFFECTED AREA TWICE A DAY  . levofloxacin (LEVAQUIN)  500 MG tablet Take 1 tablet (500 mg total) daily by mouth.   No facility-administered encounter medications on file as of 10/20/2017.      Allergies:  Patient has no known allergies.  Review of Systems: Gen:  Denies  fever, sweats. HEENT: Denies blurred vision. Cvc:  No dizziness, chest pain or heaviness Resp:   Denies cough or sputum porduction. Gi: Denies swallowing difficulty, stomach pain. constipation, bowel incontinence Gu:  Denies bladder incontinence, burning urine Ext:   No Joint pain, stiffness. Skin: No skin rash, easy bruising. Endoc:  No polyuria, polydipsia. Psych: No depression, insomnia. Other:  All other systems were reviewed and found to be negative other than what is mentioned in the HPI.   Physical Examination:   VS: BP 128/72 (BP Location: Left Arm, Cuff Size: Normal)   Pulse 69   Resp 16   Ht 6\' 1"  (1.854 m)   Wt 255 lb (115.7 kg)   SpO2 97%   BMI 33.64 kg/m   General Appearance: No distress  Neuro:without focal findings,  speech normal,  HEENT: PERRLA, EOM intact. Pulmonary: normal breath sounds, No wheezing.   CardiovascularNormal S1,S2.  No m/r/g.   Abdomen: Benign, Soft, non-tender. Renal:  No costovertebral tenderness  GU:  Not performed at this time. Endoc: No evident  thyromegaly, no signs of acromegaly. Skin:   warm, no rash. Extremities: normal, no cyanosis, clubbing.   LABORATORY PANEL:   CBC No results for input(s): WBC, HGB, HCT, PLT in the last 168 hours. ------------------------------------------------------------------------------------------------------------------  Chemistries  No results for input(s): NA, K, CL, CO2, GLUCOSE, BUN, CREATININE, CALCIUM, MG, AST, ALT, ALKPHOS, BILITOT in the last 168 hours.  Invalid input(s): GFRCGP ------------------------------------------------------------------------------------------------------------------  Cardiac Enzymes No results for input(s): TROPONINI in the last 168  hours. ------------------------------------------------------------  RADIOLOGY:   No results found for this or any previous visit. Results for orders placed during the hospital encounter of 09/11/12  DG Chest 2 View   Narrative *RADIOLOGY REPORT*  Clinical Data: - Shortness of breath, left arm pain  CHEST - 2 VIEW  Comparison: None.  Findings: No active infiltrate or effusion is seen.  There is mild peribronchial thickening present which may indicate bronchitis. Mediastinal contours are normal.  The heart is within normal limits in size.  No bony abnormality is seen.  IMPRESSION: No pneumonia.  Mild peribronchial thickening may indicate bronchitis.   Original Report Authenticated By: Ivar Drape, M.D.    ------------------------------------------------------------------------------------------------------------------  Thank  you for allowing [facility] to assist in the care of your patient. Our recommendations are noted above.  Please contact us if we can be of further service.   Marda Stalker, MD.  [facility] Office Number: [phone]  Patricia Pesa, M.D.  Merton Border, M.D

## 2017-10-20 ENCOUNTER — Ambulatory Visit (INDEPENDENT_AMBULATORY_CARE_PROVIDER_SITE_OTHER): Payer: 59 | Admitting: Internal Medicine

## 2017-10-20 ENCOUNTER — Encounter: Payer: Self-pay | Admitting: Internal Medicine

## 2017-10-20 VITALS — BP 128/72 | HR 69 | Resp 16 | Ht 73.0 in | Wt 255.0 lb

## 2017-10-20 DIAGNOSIS — G4733 Obstructive sleep apnea (adult) (pediatric): Secondary | ICD-10-CM | POA: Diagnosis not present

## 2017-10-20 NOTE — Patient Instructions (Signed)
Continue to use your CPAP every night.

## 2017-10-28 DIAGNOSIS — N200 Calculus of kidney: Secondary | ICD-10-CM | POA: Diagnosis not present

## 2017-10-28 DIAGNOSIS — N23 Unspecified renal colic: Secondary | ICD-10-CM | POA: Diagnosis not present

## 2017-11-04 DIAGNOSIS — N200 Calculus of kidney: Secondary | ICD-10-CM | POA: Diagnosis not present

## 2017-11-11 ENCOUNTER — Other Ambulatory Visit: Payer: Self-pay | Admitting: Internal Medicine

## 2017-11-11 DIAGNOSIS — I10 Essential (primary) hypertension: Secondary | ICD-10-CM

## 2017-11-11 DIAGNOSIS — E781 Pure hyperglyceridemia: Secondary | ICD-10-CM

## 2017-11-18 ENCOUNTER — Other Ambulatory Visit: Payer: Managed Care, Other (non HMO) | Admitting: Internal Medicine

## 2017-11-18 DIAGNOSIS — E781 Pure hyperglyceridemia: Secondary | ICD-10-CM

## 2017-11-18 DIAGNOSIS — I10 Essential (primary) hypertension: Secondary | ICD-10-CM

## 2017-11-18 LAB — HEPATIC FUNCTION PANEL
AG RATIO: 2.3 (calc) (ref 1.0–2.5)
ALKALINE PHOSPHATASE (APISO): 21 U/L — AB (ref 40–115)
ALT: 22 U/L (ref 9–46)
AST: 13 U/L (ref 10–35)
Albumin: 4.3 g/dL (ref 3.6–5.1)
BILIRUBIN DIRECT: 0.2 mg/dL (ref 0.0–0.2)
BILIRUBIN INDIRECT: 0.6 mg/dL (ref 0.2–1.2)
BILIRUBIN TOTAL: 0.8 mg/dL (ref 0.2–1.2)
Globulin: 1.9 g/dL (calc) (ref 1.9–3.7)
Total Protein: 6.2 g/dL (ref 6.1–8.1)

## 2017-11-18 LAB — LIPID PANEL
Cholesterol: 105 mg/dL (ref <200)
HDL: 29 mg/dL — ABNORMAL LOW (ref >40)
LDL Cholesterol (Calc): 53 mg/dL (calc)
NON-HDL CHOLESTEROL (CALC): 76 mg/dL (ref <130)
TRIGLYCERIDES: 150 mg/dL — AB (ref <150)
Total CHOL/HDL Ratio: 3.6 (calc) (ref <5.0)

## 2017-11-21 ENCOUNTER — Encounter: Payer: Self-pay | Admitting: Internal Medicine

## 2017-11-21 ENCOUNTER — Ambulatory Visit (INDEPENDENT_AMBULATORY_CARE_PROVIDER_SITE_OTHER): Payer: 59 | Admitting: Internal Medicine

## 2017-11-21 VITALS — BP 130/80 | HR 65 | Ht 73.0 in | Wt 258.0 lb

## 2017-11-21 DIAGNOSIS — F439 Reaction to severe stress, unspecified: Secondary | ICD-10-CM | POA: Diagnosis not present

## 2017-11-21 DIAGNOSIS — I10 Essential (primary) hypertension: Secondary | ICD-10-CM | POA: Diagnosis not present

## 2017-11-21 DIAGNOSIS — Z87442 Personal history of urinary calculi: Secondary | ICD-10-CM | POA: Diagnosis not present

## 2017-11-21 DIAGNOSIS — E7849 Other hyperlipidemia: Secondary | ICD-10-CM | POA: Diagnosis not present

## 2017-11-21 DIAGNOSIS — Z6834 Body mass index (BMI) 34.0-34.9, adult: Secondary | ICD-10-CM | POA: Diagnosis not present

## 2017-11-21 NOTE — Patient Instructions (Signed)
Continue same medications.  Continue to work on diet and exercise and weight loss.  Drink plenty of water with history of kidney stones.  Return in 6 months for physical exam.

## 2017-11-21 NOTE — Progress Notes (Signed)
   Subjective:    Patient ID: Lance Beard, male    DOB: 23-Jan-1961, 57 y.o.   MRN: [id number]  HPI  Pt in for 6 month follow up. Had kidney stone about a month ago. Saw Urologist and passes stone. Another calcium oxalate stone. .BP stable at 130/80. Weight was 235 pounds in 2017 and now is 258 pounds.Diet and exercise discussed. Mother passes away recently of what sounds like acute CHF and had been dx with breast CA. Daughter getting married in May. Busy dealing with Mom's estate.  Lipip panel is WNL except has low HDL of 29. Liver panel normal and BP stable.     Review of Systems no new complaints. Does not tolerate Flomax well causes urticaria.     Objective:   Physical Exam  Skin warm and dry.  Chest clear to auscultation.  Cardiac exam regular rate and rhythm normal S1 and S2.  Extremities without edema      Assessment & Plan:  Situational stress  Hyperlipidemia  Weight gain-BMI 34  Sleep apnea  Essential HTN  Kidney stones  Plan: Continue same meds and RTC in 6 moths. Work on diet and exercise.

## 2017-12-25 DIAGNOSIS — G4733 Obstructive sleep apnea (adult) (pediatric): Secondary | ICD-10-CM | POA: Diagnosis not present

## 2018-01-07 ENCOUNTER — Other Ambulatory Visit: Payer: Self-pay | Admitting: Internal Medicine

## 2018-04-07 DIAGNOSIS — G4733 Obstructive sleep apnea (adult) (pediatric): Secondary | ICD-10-CM | POA: Diagnosis not present

## 2018-06-02 ENCOUNTER — Other Ambulatory Visit: Payer: Managed Care, Other (non HMO) | Admitting: Internal Medicine

## 2018-06-04 ENCOUNTER — Encounter: Payer: Managed Care, Other (non HMO) | Admitting: Internal Medicine

## 2018-06-05 DIAGNOSIS — Z23 Encounter for immunization: Secondary | ICD-10-CM | POA: Diagnosis not present

## 2018-06-15 ENCOUNTER — Encounter: Payer: Self-pay | Admitting: Internal Medicine

## 2018-06-17 ENCOUNTER — Encounter: Payer: Self-pay | Admitting: Internal Medicine

## 2018-07-12 ENCOUNTER — Other Ambulatory Visit: Payer: Self-pay | Admitting: Internal Medicine

## 2018-07-21 ENCOUNTER — Other Ambulatory Visit: Payer: Self-pay

## 2018-07-21 ENCOUNTER — Ambulatory Visit (AMBULATORY_SURGERY_CENTER): Payer: Self-pay | Admitting: *Deleted

## 2018-07-21 ENCOUNTER — Encounter: Payer: Self-pay | Admitting: Internal Medicine

## 2018-07-21 VITALS — Ht 74.0 in | Wt 269.0 lb

## 2018-07-21 DIAGNOSIS — Z86010 Personal history of colonic polyps: Secondary | ICD-10-CM

## 2018-07-21 MED ORDER — NA SULFATE-K SULFATE-MG SULF 17.5-3.13-1.6 GM/177ML PO SOLN: 1.0000 | Freq: Once | ORAL | 0 refills | Status: AC

## 2018-07-21 NOTE — Progress Notes (Signed)
No egg or soy allergy  No intubation problem per pt; had Propofol with last procedure without problems  No home oxygen used  No diet medications used  No chewing tobacco used  $15 off Suprep coupon given  Refused Emmi

## 2018-08-04 ENCOUNTER — Ambulatory Visit (AMBULATORY_SURGERY_CENTER): Payer: 59 | Admitting: Internal Medicine

## 2018-08-04 ENCOUNTER — Encounter: Payer: Self-pay | Admitting: Internal Medicine

## 2018-08-04 VITALS — BP 114/67 | HR 64 | Temp 99.1°F | Resp 13 | Ht 73.0 in | Wt 258.0 lb

## 2018-08-04 DIAGNOSIS — D123 Benign neoplasm of transverse colon: Secondary | ICD-10-CM

## 2018-08-04 DIAGNOSIS — D120 Benign neoplasm of cecum: Secondary | ICD-10-CM

## 2018-08-04 DIAGNOSIS — Z86010 Personal history of colonic polyps: Secondary | ICD-10-CM

## 2018-08-04 DIAGNOSIS — D122 Benign neoplasm of ascending colon: Secondary | ICD-10-CM

## 2018-08-04 MED ORDER — SODIUM CHLORIDE 0.9 % IV SOLN: 500.0000 mL | Freq: Once | INTRAVENOUS | Status: DC

## 2018-08-04 NOTE — Progress Notes (Signed)
Called to room to assist during endoscopic procedure.  Patient ID and intended procedure confirmed with present staff. Received instructions for my participation in the procedure from the performing physician.  

## 2018-08-04 NOTE — Progress Notes (Signed)
PT taken to PACU. Monitors in place. VSS. Report given to RN. 

## 2018-08-04 NOTE — Patient Instructions (Signed)
YOU HAD AN ENDOSCOPIC PROCEDURE TODAY AT THE [facility]:   Refer to the procedure report that was given to you for any specific questions about what was found during the examination.  If the procedure report does not answer your questions, please call your gastroenterologist to clarify.  If you requested that your care partner not be given the details of your procedure findings, then the procedure report has been included in a sealed envelope for you to review at your convenience later.  YOU SHOULD EXPECT: Some feelings of bloating in the abdomen. Passage of more gas than usual.  Walking can help get rid of the air that was put into your GI tract during the procedure and reduce the bloating. If you had a lower endoscopy (such as a colonoscopy or flexible sigmoidoscopy) you may notice spotting of blood in your stool or on the toilet paper. If you underwent a bowel prep for your procedure, you may not have a normal bowel movement for a few days.  Please Note:  You might notice some irritation and congestion in your nose or some drainage.  This is from the oxygen used during your procedure.  There is no need for concern and it should clear up in a day or so.  SYMPTOMS TO REPORT IMMEDIATELY:   Following lower endoscopy (colonoscopy or flexible sigmoidoscopy):  Excessive amounts of blood in the stool  Significant tenderness or worsening of abdominal pains  Swelling of the abdomen that is new, acute  Fever of 100F or higher  For urgent or emergent issues, a gastroenterologist can be reached at any hour by calling [phone].   DIET:  We do recommend a small meal at first, but then you may proceed to your regular diet.  Drink plenty of fluids but you should avoid alcoholic beverages for 24 hours.  MEDICATIONS: Continue present medications.  Please see handouts given to you by your recovery nurse.  ACTIVITY:  You should plan to take it easy for the rest of today and you should NOT  DRIVE or use heavy machinery until tomorrow (because of the sedation medicines used during the test).    FOLLOW UP: Our staff will call the number listed on your records the next business day following your procedure to check on you and address any questions or concerns that you may have regarding the information given to you following your procedure. If we do not reach you, we will leave a message.  However, if you are feeling well and you are not experiencing any problems, there is no need to return our call.  We will assume that you have returned to your regular daily activities without incident.  If any biopsies were taken you will be contacted by phone or by letter within the next 1-3 weeks.  Please call us at [phone] if you have not heard about the biopsies in 3 weeks.   Thank you for allowing us to provide for your healthcare needs today.   SIGNATURES/CONFIDENTIALITY: You and/or your care partner have signed paperwork which will be entered into your electronic medical record.  These signatures attest to the fact that that the information above on your After Visit Summary has been reviewed and is understood.  Full responsibility of the confidentiality of this discharge information lies with you and/or your care-partner. 

## 2018-08-04 NOTE — Op Note (Signed)
[facility] Patient Name: Lance Beard Procedure Date: 08/04/2018 9:11 AM MRN: [id number] Endoscopist: Docia Chuck. Henrene Pastor , MD Age: 57 Referring MD:  Date of Birth: [birth year] Gender: Male Account #: [id number] Procedure:                Colonoscopy with cold snare polypectomy x 4 Indications:              High risk colon cancer surveillance: Personal                            history of adenoma (10 mm or greater in size), High                            risk colon cancer surveillance: Personal history of                            multiple (3 or more) adenomas. Previous examination                            September 2016 Medicines:                Monitored Anesthesia Care Procedure:                Pre-Anesthesia Assessment:                           - Prior to the procedure, a History and Physical                            was performed, and patient medications and                            allergies were reviewed. The patient's tolerance of                            previous anesthesia was also reviewed. The risks                            and benefits of the procedure and the sedation                            options and risks were discussed with the patient.                            All questions were answered, and informed consent                            was obtained. Prior Anticoagulants: The patient has                            taken no previous anticoagulant or antiplatelet                            agents. ASA Grade Assessment: II - A patient with  mild systemic disease. After reviewing the risks                            and benefits, the patient was deemed in                            satisfactory condition to undergo the procedure.                           After obtaining informed consent, the colonoscope                            was passed under direct vision. Throughout the                            procedure, the  patient's blood pressure, pulse, and                            oxygen saturations were monitored continuously. The                            Colonoscope was introduced through the anus and                            advanced to the the cecum, identified by                            appendiceal orifice and ileocecal valve. The                            ileocecal valve, appendiceal orifice, and rectum                            were photographed. The quality of the bowel                            preparation was excellent. The colonoscopy was                            performed without difficulty. The patient tolerated                            the procedure well. The bowel preparation used was                            SUPREP. Scope In: 6:44:03 AM Scope Out: 9:31:42 AM Scope Withdrawal Time: 0 hours 12 minutes 41 seconds  Total Procedure Duration: 0 hours 15 minutes 25 seconds  Findings:                 Four polyps were found in the transverse colon,                            ascending colon and cecum. The polyps were 1 to 6  mm in size. These polyps were removed with a cold                            snare. Resection and retrieval were complete.                           Multiple small-mouthed diverticula were found in                            the left colon.                           The exam was otherwise without abnormality on                            direct and retroflexion views. Complications:            No immediate complications. Estimated blood loss:                            None. Estimated Blood Loss:     Estimated blood loss: none. Impression:               - Four 1 to 6 mm polyps in the transverse colon, in                            the ascending colon and in the cecum, removed with                            a cold snare. Resected and retrieved.                           - Diverticulosis in the left colon.                            - The examination was otherwise normal on direct                            and retroflexion views. Recommendation:           - Repeat colonoscopy in 3 - 5 years for                            surveillance.                           - Patient has a contact number available for                            emergencies. The signs and symptoms of potential                            delayed complications were discussed with the                            patient. Return to normal activities  tomorrow.                            Written discharge instructions were provided to the                            patient.                           - Resume previous diet.                           - Continue present medications.                           - Await pathology results. Docia Chuck. Henrene Pastor, MD 08/04/2018 9:36:37 AM This report has been signed electronically.

## 2018-08-05 ENCOUNTER — Telehealth: Payer: Self-pay

## 2018-08-05 NOTE — Telephone Encounter (Signed)
  Follow up Call-  Call back number 08/04/2018  Post procedure Call Back phone  # [phone]  Permission to leave phone message Yes  Some recent data might be hidden     Patient questions:  Do you have a fever, pain , or abdominal swelling? No. Pain Score  0 *  Have you tolerated food without any problems? Yes.    Have you been able to return to your normal activities? Yes.    Do you have any questions about your discharge instructions: Diet   No. Medications  No. Follow up visit  No.  Do you have questions or concerns about your Care? No.  Actions: * If pain score is 4 or above: No action needed, pain <4.

## 2018-08-10 ENCOUNTER — Encounter: Payer: Self-pay | Admitting: Internal Medicine

## 2018-08-17 ENCOUNTER — Other Ambulatory Visit: Payer: Managed Care, Other (non HMO) | Admitting: Internal Medicine

## 2018-08-17 DIAGNOSIS — H1851 Endothelial corneal dystrophy: Secondary | ICD-10-CM

## 2018-08-17 DIAGNOSIS — I10 Essential (primary) hypertension: Secondary | ICD-10-CM

## 2018-08-17 DIAGNOSIS — R7302 Impaired glucose tolerance (oral): Secondary | ICD-10-CM

## 2018-08-17 DIAGNOSIS — E7849 Other hyperlipidemia: Secondary | ICD-10-CM

## 2018-08-17 DIAGNOSIS — Z87442 Personal history of urinary calculi: Secondary | ICD-10-CM

## 2018-08-17 DIAGNOSIS — H18519 Endothelial corneal dystrophy, unspecified eye: Secondary | ICD-10-CM

## 2018-08-17 DIAGNOSIS — F439 Reaction to severe stress, unspecified: Secondary | ICD-10-CM

## 2018-08-17 DIAGNOSIS — Z6833 Body mass index (BMI) 33.0-33.9, adult: Secondary | ICD-10-CM

## 2018-08-18 LAB — COMPLETE METABOLIC PANEL WITH GFR
AG Ratio: 1.8 (calc) (ref 1.0–2.5)
ALKALINE PHOSPHATASE (APISO): 23 U/L — AB (ref 40–115)
ALT: 33 U/L (ref 9–46)
AST: 15 U/L (ref 10–35)
Albumin: 4.2 g/dL (ref 3.6–5.1)
BUN: 17 mg/dL (ref 7–25)
CO2: 30 mmol/L (ref 20–32)
Calcium: 9.2 mg/dL (ref 8.6–10.3)
Chloride: 105 mmol/L (ref 98–110)
Creat: 1 mg/dL (ref 0.70–1.33)
GFR, Est African American: 97 mL/min/{1.73_m2} (ref >=60)
GFR, Est Non African American: 84 mL/min/{1.73_m2} (ref >=60)
Globulin: 2.3 g/dL (calc) (ref 1.9–3.7)
Glucose, Bld: 116 mg/dL — ABNORMAL HIGH (ref 65–99)
Potassium: 4.4 mmol/L (ref 3.5–5.3)
SODIUM: 141 mmol/L (ref 135–146)
Total Bilirubin: 1 mg/dL (ref 0.2–1.2)
Total Protein: 6.5 g/dL (ref 6.1–8.1)

## 2018-08-18 LAB — CBC WITH DIFFERENTIAL/PLATELET
Absolute Monocytes: 522 cells/uL (ref 200–950)
Basophils Absolute: 29 cells/uL (ref 0–200)
Basophils Relative: 0.5 %
Eosinophils Absolute: 41 cells/uL (ref 15–500)
Eosinophils Relative: 0.7 %
HCT: 47.9 % (ref 38.5–50.0)
Hemoglobin: 17.3 g/dL — ABNORMAL HIGH (ref 13.2–17.1)
Lymphs Abs: 1566 cells/uL (ref 850–3900)
MCH: 33.4 pg — ABNORMAL HIGH (ref 27.0–33.0)
MCHC: 36.1 g/dL — ABNORMAL HIGH (ref 32.0–36.0)
MCV: 92.5 fL (ref 80.0–100.0)
MPV: 10.1 fL (ref 7.5–12.5)
Monocytes Relative: 9 %
Neutro Abs: 3642 cells/uL (ref 1500–7800)
Neutrophils Relative %: 62.8 %
PLATELETS: 200 10*3/uL (ref 140–400)
RBC: 5.18 10*6/uL (ref 4.20–5.80)
RDW: 13.5 % (ref 11.0–15.0)
Total Lymphocyte: 27 %
WBC: 5.8 10*3/uL (ref 3.8–10.8)

## 2018-08-18 LAB — HEMOGLOBIN A1C
Hgb A1c MFr Bld: 6 % of total Hgb — ABNORMAL HIGH (ref <5.7)
Mean Plasma Glucose: 126 (calc)
eAG (mmol/L): 7 (calc)

## 2018-08-18 LAB — PSA: PSA: 1.1 ng/mL (ref <=4.0)

## 2018-08-18 LAB — LIPID PANEL
CHOL/HDL RATIO: 3.9 (calc) (ref <5.0)
Cholesterol: 102 mg/dL (ref <200)
HDL: 26 mg/dL — ABNORMAL LOW (ref >40)
LDL CHOLESTEROL (CALC): 49 mg/dL
Non-HDL Cholesterol (Calc): 76 mg/dL (calc) (ref <130)
Triglycerides: 198 mg/dL — ABNORMAL HIGH (ref <150)

## 2018-08-21 ENCOUNTER — Ambulatory Visit (INDEPENDENT_AMBULATORY_CARE_PROVIDER_SITE_OTHER): Payer: 59 | Admitting: Internal Medicine

## 2018-08-21 ENCOUNTER — Encounter: Payer: Self-pay | Admitting: Internal Medicine

## 2018-08-21 VITALS — BP 140/60 | HR 80 | Temp 98.2°F | Ht 73.0 in | Wt 267.0 lb

## 2018-08-21 DIAGNOSIS — E781 Pure hyperglyceridemia: Secondary | ICD-10-CM

## 2018-08-21 DIAGNOSIS — I10 Essential (primary) hypertension: Secondary | ICD-10-CM | POA: Diagnosis not present

## 2018-08-21 DIAGNOSIS — Z87442 Personal history of urinary calculi: Secondary | ICD-10-CM

## 2018-08-21 DIAGNOSIS — R7302 Impaired glucose tolerance (oral): Secondary | ICD-10-CM

## 2018-08-21 DIAGNOSIS — Z6835 Body mass index (BMI) 35.0-35.9, adult: Secondary | ICD-10-CM

## 2018-08-21 DIAGNOSIS — Z0000 Encounter for general adult medical examination without abnormal findings: Secondary | ICD-10-CM | POA: Diagnosis not present

## 2018-08-21 DIAGNOSIS — Z86010 Personal history of colonic polyps: Secondary | ICD-10-CM

## 2018-08-21 LAB — POCT URINALYSIS DIPSTICK
Appearance: NEGATIVE
Bilirubin, UA: NEGATIVE
Blood, UA: NEGATIVE
Glucose, UA: NEGATIVE
Ketones, UA: NEGATIVE
Leukocytes, UA: NEGATIVE
Nitrite, UA: NEGATIVE
Odor: NEGATIVE
Protein, UA: NEGATIVE
SPEC GRAV UA: 1.015 (ref 1.010–1.025)
Urobilinogen, UA: 0.2 E.U./dL
pH, UA: 6 (ref 5.0–8.0)

## 2018-08-21 MED ORDER — ATENOLOL 25 MG PO TABS: 25.0000 mg | ORAL_TABLET | Freq: Every day | ORAL | 5 refills | Status: DC

## 2018-08-21 MED ORDER — ATORVASTATIN CALCIUM 20 MG PO TABS: 20.0000 mg | ORAL_TABLET | Freq: Every day | ORAL | 1 refills | Status: DC

## 2018-08-21 NOTE — Patient Instructions (Signed)
Please try to diet exercise and lose weight.  Continue same medications and follow-up in 6 months.

## 2018-08-21 NOTE — Progress Notes (Signed)
Subjective:    Patient ID: Lance Beard, male    DOB: February 28, 1961, 57 y.o.   MRN: [id number]  HPI 57 year old Male for health maintenance exam and evaluation of medical issues.  He has hypertension, hyperlipidemia and impaired glucose tolerance.  History of kidney stone January 2018 which he passed.  Stone was analyzed and was found to be calcium oxalate.  He saw urologist.  History of colonoscopy by Dr. Henrene Pastor with 3 tubular adenomas removed in 2014-11-18.  In June 2015 he weighed 262 pounds, in March 2015 he weighed 276 pounds, in Nov 19, 2015 he weighed 235 pounds and in September 2018 he weighed 252 pounds.  Today weighs 267 pounds.  Says he is not been diet and exercising as he should.  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  Has 3 more years on the second term.  His daughter was married in May and resides in [place].  Family history: His mother died in 2022/11/18 of respiratory problems.  Sister in good health.  Father deceased with diabetes, hypertension, coronary artery disease and renal failure.  Father died of complications of multiple myeloma.  Patient was found to have hemoglobin A1c of 6.2% in June 2015 which he improved with diet and exercise.  Social history: He is married.  One adopted daughter.  Wife retired as a Art therapist in the [employer].  Patient does not smoke or consume alcohol.  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  Past medical history: Fractured nose playing ball 1981, appendectomy 1981, surgery for left varicocele 1991, surgery for nasal septal deviation 11/18/82.  He has a history of sleep apnea.  Diagnosed by Dr. Annamaria Boots in 11-18-05 and CPAP had been recommended.  In January 2014 he was evaluated by cardiologist for nonspecific chest pain and had a negative nuclear medicine study after having a false positive stress test.  History of borderline first-degree AV block on EKG.  Previous Cardiolite study  2002/11/18 was negative.    Review of Systems  Constitutional: Negative.   Respiratory: Negative.   Cardiovascular: Negative.   Gastrointestinal: Negative.   Genitourinary: Negative.   Musculoskeletal: Negative.   Neurological: Negative.   Psychiatric/Behavioral: Negative.        Objective:   Physical Exam Vitals signs reviewed.  Constitutional:      General: He is not in acute distress.    Appearance: Normal appearance.  HENT:     Head: Normocephalic and atraumatic.     Right Ear: Tympanic membrane normal.     Left Ear: Tympanic membrane normal.     Mouth/Throat:     Pharynx: Oropharynx is clear.  Eyes:     General: No scleral icterus.       Right eye: No discharge.        Left eye: No discharge.     Pupils: Pupils are equal, round, and reactive to light.  Neck:     Musculoskeletal: Neck supple.  Cardiovascular:     Rate and Rhythm: Normal rate and regular rhythm.     Heart sounds: Normal heart sounds. No murmur.  Pulmonary:     Effort: No respiratory distress.     Breath sounds: Normal breath sounds. No stridor. No wheezing.  Abdominal:     General: Bowel sounds are normal.     Palpations: Abdomen is soft. There is no mass.     Tenderness: There is no abdominal tenderness.  There is no guarding or rebound.  Genitourinary:    Prostate: Normal.  Musculoskeletal:     Right lower leg: No edema.     Left lower leg: No edema.  Lymphadenopathy:     Cervical: No cervical adenopathy.  Skin:    General: Skin is warm and dry.  Neurological:     General: No focal deficit present.     Mental Status: He is alert and oriented to person, place, and time.     Cranial Nerves: No cranial nerve deficit.  Psychiatric:        Mood and Affect: Mood normal.        Behavior: Behavior normal.        Thought Content: Thought content normal.        Judgment: Judgment normal.           Assessment & Plan:  Impaired glucose tolerance-hemoglobin A1c 6% and previously was 5.5%  September 2018.  Does not want to be on metformin.  Continue diet exercise and weight loss efforts and follow-up in 6 months.  Hypertriglyceridemia- triglycerides elevated at 198 with normal total cholesterol and LDL cholesterol.  Triglycerides were normal in March 2019  History of sleep apnea  Essential hypertension-stable on current medication-advised patient to continue to monitor blood pressure  History of kidney stones  History of adenomatous colon polyps on colonoscopy 2016  BMI 35.23-encourage diet exercise and weight loss and follow-up in 6 months

## 2018-08-22 LAB — MICROALBUMIN / CREATININE URINE RATIO
Creatinine, Urine: 155 mg/dL (ref 20–320)
Microalb Creat Ratio: 5 mcg/mg creat (ref <30)
Microalb, Ur: 0.8 mg/dL

## 2018-09-23 ENCOUNTER — Other Ambulatory Visit: Payer: Self-pay | Admitting: Dermatology

## 2018-09-23 DIAGNOSIS — D485 Neoplasm of uncertain behavior of skin: Secondary | ICD-10-CM | POA: Diagnosis not present

## 2018-09-23 DIAGNOSIS — D229 Melanocytic nevi, unspecified: Secondary | ICD-10-CM | POA: Diagnosis not present

## 2018-09-23 DIAGNOSIS — L821 Other seborrheic keratosis: Secondary | ICD-10-CM | POA: Diagnosis not present

## 2018-10-20 DIAGNOSIS — G4733 Obstructive sleep apnea (adult) (pediatric): Secondary | ICD-10-CM | POA: Diagnosis not present

## 2018-11-19 ENCOUNTER — Encounter: Payer: Self-pay | Admitting: *Deleted

## 2019-02-16 ENCOUNTER — Other Ambulatory Visit: Payer: Managed Care, Other (non HMO) | Admitting: Internal Medicine

## 2019-02-16 ENCOUNTER — Other Ambulatory Visit: Payer: Self-pay

## 2019-02-16 ENCOUNTER — Other Ambulatory Visit: Payer: Self-pay | Admitting: Internal Medicine

## 2019-02-16 VITALS — Temp 98.2°F

## 2019-02-16 DIAGNOSIS — Z79899 Other long term (current) drug therapy: Secondary | ICD-10-CM

## 2019-02-16 DIAGNOSIS — R7302 Impaired glucose tolerance (oral): Secondary | ICD-10-CM

## 2019-02-16 DIAGNOSIS — Z5181 Encounter for therapeutic drug level monitoring: Secondary | ICD-10-CM

## 2019-02-16 DIAGNOSIS — E785 Hyperlipidemia, unspecified: Secondary | ICD-10-CM

## 2019-02-16 DIAGNOSIS — E7849 Other hyperlipidemia: Secondary | ICD-10-CM

## 2019-02-17 LAB — MICROALBUMIN / CREATININE URINE RATIO
Creatinine, Urine: 202 mg/dL (ref 20–320)
Microalb Creat Ratio: 7 mcg/mg creat (ref <30)
Microalb, Ur: 1.4 mg/dL

## 2019-02-17 LAB — HEPATIC FUNCTION PANEL
AG Ratio: 2.2 (calc) (ref 1.0–2.5)
ALT: 33 U/L (ref 9–46)
AST: 16 U/L (ref 10–35)
Albumin: 4.4 g/dL (ref 3.6–5.1)
Alkaline phosphatase (APISO): 25 U/L — ABNORMAL LOW (ref 35–144)
Bilirubin, Direct: 0.2 mg/dL (ref 0.0–0.2)
Globulin: 2 g/dL (calc) (ref 1.9–3.7)
Indirect Bilirubin: 0.7 mg/dL (calc) (ref 0.2–1.2)
Total Bilirubin: 0.9 mg/dL (ref 0.2–1.2)
Total Protein: 6.4 g/dL (ref 6.1–8.1)

## 2019-02-17 LAB — HEMOGLOBIN A1C
Hgb A1c MFr Bld: 6.1 % of total Hgb — ABNORMAL HIGH (ref <5.7)
Mean Plasma Glucose: 128 (calc)
eAG (mmol/L): 7.1 (calc)

## 2019-02-17 LAB — LIPID PANEL
Cholesterol: 89 mg/dL (ref <200)
HDL: 26 mg/dL — ABNORMAL LOW (ref >=40)
LDL Cholesterol (Calc): 34 mg/dL (calc)
Non-HDL Cholesterol (Calc): 63 mg/dL (calc) (ref <130)
Total CHOL/HDL Ratio: 3.4 (calc) (ref <5.0)
Triglycerides: 218 mg/dL — ABNORMAL HIGH (ref <150)

## 2019-02-17 LAB — HOUSE ACCOUNT TRACKING

## 2019-02-19 ENCOUNTER — Ambulatory Visit (INDEPENDENT_AMBULATORY_CARE_PROVIDER_SITE_OTHER): Payer: Managed Care, Other (non HMO) | Admitting: Internal Medicine

## 2019-02-19 ENCOUNTER — Encounter: Payer: Self-pay | Admitting: Internal Medicine

## 2019-02-19 ENCOUNTER — Other Ambulatory Visit: Payer: Self-pay

## 2019-02-19 VITALS — BP 140/80 | HR 62 | Temp 98.3°F | Ht 73.0 in | Wt 269.0 lb

## 2019-02-19 DIAGNOSIS — E786 Lipoprotein deficiency: Secondary | ICD-10-CM

## 2019-02-19 DIAGNOSIS — I10 Essential (primary) hypertension: Secondary | ICD-10-CM | POA: Diagnosis not present

## 2019-02-19 DIAGNOSIS — E781 Pure hyperglyceridemia: Secondary | ICD-10-CM | POA: Diagnosis not present

## 2019-02-19 DIAGNOSIS — E8881 Metabolic syndrome: Secondary | ICD-10-CM

## 2019-02-19 DIAGNOSIS — R7302 Impaired glucose tolerance (oral): Secondary | ICD-10-CM | POA: Diagnosis not present

## 2019-02-19 DIAGNOSIS — Z6835 Body mass index (BMI) 35.0-35.9, adult: Secondary | ICD-10-CM | POA: Diagnosis not present

## 2019-02-19 NOTE — Progress Notes (Signed)
   Subjective:    Patient ID: Lance Beard, male    DOB: 1961-03-23, 58 y.o.   MRN: [id number]  HPI 58 year old male in today for 29-month follow-up appointment.  He has a history of hypertension, impaired glucose tolerance and hypertriglyceridemia.  Also history of kidney stones but none recently.  History of obstructive sleep apnea.  Currently maintained on Lipitor 20 mg daily, Tenormin 25 mg daily .  Impaired glucose tolerance has been treated with diet alone.  Hemoglobin A1c 6.1% and previously was 6%.  Triglycerides are elevated at 218 and in December 2019 were 198.  He has a low HDL of 26.  Total cholesterol is low at 89.    Review of Systems see above with no new complaints     Objective:   Physical Exam Blood pressure 140/80, BMI 35.49.  Weight 269 pounds.  Diet and exercise discussed.  Neck is supple without JVD thyromegaly or carotid bruits.  Chest clear to auscultation.  Cardiac exam regular rate and rhythm normal S1 and S2.  Extremities without edema.       Assessment & Plan:    Low HDL cholesterol at 26  Hypertriglyceridemia not responding to TriCor.  Try Lipitor 20 mg daily.  Essential hypertension-blood pressure borderline today.  Needs to lose weight.  Diet and exercise discussed.  BMI 35.49.  Diet and exercise discussed  Impaired glucose tolerance- try metformin XR 500 mg daily.  Return in October for lipid panel liver functions and hemoglobin A1c without office.  Return in December for physical exam and fasting lab work.  25 minutes spent with patient.

## 2019-02-23 ENCOUNTER — Telehealth: Payer: Self-pay | Admitting: Internal Medicine

## 2019-02-23 ENCOUNTER — Encounter: Payer: Self-pay | Admitting: Internal Medicine

## 2019-02-23 MED ORDER — METFORMIN HCL ER 500 MG PO TB24: 500.0000 mg | ORAL_TABLET | Freq: Every day | ORAL | 1 refills | Status: DC

## 2019-02-23 NOTE — Telephone Encounter (Signed)
Lance Beard [phone]  [facility] called to say when he was in for his visit that you guys talked about a medication for his A1C,

## 2019-02-23 NOTE — Telephone Encounter (Signed)
Called in Glucophage ER 500 mg daily. Has visit scheduled for labs in Oct and CPE in december

## 2019-02-23 NOTE — Telephone Encounter (Signed)
Will do!

## 2019-03-13 NOTE — Patient Instructions (Signed)
Follow-up in October with lipid panel liver functions and hemoglobin A1c.  CPE scheduled for December.  Watch diet and get more exercise.

## 2019-05-19 LAB — HM DIABETES EYE EXAM

## 2019-05-24 ENCOUNTER — Other Ambulatory Visit: Payer: Self-pay | Admitting: Internal Medicine

## 2019-05-28 ENCOUNTER — Other Ambulatory Visit: Payer: Self-pay

## 2019-05-28 ENCOUNTER — Other Ambulatory Visit: Payer: Managed Care, Other (non HMO) | Admitting: Internal Medicine

## 2019-05-28 DIAGNOSIS — R7302 Impaired glucose tolerance (oral): Secondary | ICD-10-CM

## 2019-05-28 DIAGNOSIS — I10 Essential (primary) hypertension: Secondary | ICD-10-CM

## 2019-05-28 DIAGNOSIS — E781 Pure hyperglyceridemia: Secondary | ICD-10-CM

## 2019-05-29 LAB — HEMOGLOBIN A1C
Hgb A1c MFr Bld: 6 % of total Hgb — ABNORMAL HIGH (ref <5.7)
Mean Plasma Glucose: 126 (calc)
eAG (mmol/L): 7 (calc)

## 2019-05-29 LAB — HEPATIC FUNCTION PANEL
AG Ratio: 1.8 (calc) (ref 1.0–2.5)
ALT: 33 U/L (ref 9–46)
AST: 16 U/L (ref 10–35)
Albumin: 4.2 g/dL (ref 3.6–5.1)
Alkaline phosphatase (APISO): 26 U/L — ABNORMAL LOW (ref 35–144)
Bilirubin, Direct: 0.2 mg/dL (ref 0.0–0.2)
Globulin: 2.3 g/dL (calc) (ref 1.9–3.7)
Indirect Bilirubin: 0.7 mg/dL (calc) (ref 0.2–1.2)
Total Bilirubin: 0.9 mg/dL (ref 0.2–1.2)
Total Protein: 6.5 g/dL (ref 6.1–8.1)

## 2019-05-29 LAB — LIPID PANEL
Cholesterol: 97 mg/dL (ref <200)
HDL: 25 mg/dL — ABNORMAL LOW (ref >=40)
LDL Cholesterol (Calc): 41 mg/dL (calc)
Non-HDL Cholesterol (Calc): 72 mg/dL (calc) (ref <130)
Total CHOL/HDL Ratio: 3.9 (calc) (ref <5.0)
Triglycerides: 245 mg/dL — ABNORMAL HIGH (ref <150)

## 2019-06-09 ENCOUNTER — Telehealth: Payer: Self-pay | Admitting: Internal Medicine

## 2019-06-09 NOTE — Telephone Encounter (Signed)
Lance Beard [phone]  Lance Beard called to say that over the weekend he felt like his blood pressure might be up, just didn't feel right, so this morning he was still feeling that way so he took his blood pressure at work a couple different times and it was 165/80 and 168/85. He also stated that no one had called him with his most recent lab results.

## 2019-06-09 NOTE — Telephone Encounter (Signed)
The triglycerides are elevated but glucose control stable. Please ask him to come in this week to discuss this and blood pressure. I thought he had seen these on My Chart

## 2019-06-09 NOTE — Telephone Encounter (Signed)
Scheduled appointment

## 2019-06-10 ENCOUNTER — Encounter: Payer: Self-pay | Admitting: Internal Medicine

## 2019-06-10 ENCOUNTER — Ambulatory Visit: Payer: Managed Care, Other (non HMO) | Admitting: Internal Medicine

## 2019-06-10 ENCOUNTER — Other Ambulatory Visit: Payer: Self-pay

## 2019-06-10 VITALS — BP 140/70 | HR 68 | Temp 98.0°F | Ht 73.0 in | Wt 268.0 lb

## 2019-06-10 DIAGNOSIS — E781 Pure hyperglyceridemia: Secondary | ICD-10-CM | POA: Diagnosis not present

## 2019-06-10 DIAGNOSIS — Z6835 Body mass index (BMI) 35.0-35.9, adult: Secondary | ICD-10-CM | POA: Diagnosis not present

## 2019-06-10 DIAGNOSIS — R7302 Impaired glucose tolerance (oral): Secondary | ICD-10-CM

## 2019-06-10 DIAGNOSIS — E786 Lipoprotein deficiency: Secondary | ICD-10-CM | POA: Diagnosis not present

## 2019-06-10 DIAGNOSIS — E8881 Metabolic syndrome: Secondary | ICD-10-CM

## 2019-06-10 DIAGNOSIS — I10 Essential (primary) hypertension: Secondary | ICD-10-CM

## 2019-06-10 MED ORDER — GLIPIZIDE ER 2.5 MG PO TB24: 2.5000 mg | ORAL_TABLET | Freq: Every day | ORAL | 0 refills | Status: DC

## 2019-06-10 MED ORDER — ATORVASTATIN CALCIUM 40 MG PO TABS: 40.0000 mg | ORAL_TABLET | Freq: Every day | ORAL | 0 refills | Status: DC

## 2019-06-10 NOTE — Progress Notes (Signed)
   Subjective:    Patient ID: Lance Beard, male    DOB: 1961-07-22, 58 y.o.   MRN: [id number]  HPI 58 year old Male in today for follow-up on impaired glucose tolerance.  Hemoglobin A1c is 6% and previously was 6.1%.  Metformin is causing some issues with diarrhea.  We will try glipizide 2.5 mg daily and follow-up in December at time of physical exam.  Triglycerides are elevated at 245 and previously were 218 in June.  He has a low HDL of 25.  Total cholesterol and LDL cholesterol are low.  He has been on Lipitor 20 mg daily.  Increasing Lipitor to 40 mg daily.    Review of Systems see above     Objective:   Physical Exam Blood pressure 140/70 BMI 35.36 weight 268 pounds.  Pulse 68 and regular.  Chest clear.  Cardiac exam regular rate and rhythm.  Extremities without edema.       Assessment & Plan:  BMI 35.36-needs to diet and exercise and I think triglycerides and blood pressure would improve as well as hemoglobin A1c  Glucose intolerance-change Metformin to glipizide 2.5 mg daily because of diarrhea on Metformin.  Follow-up in December  Essential hypertension-continue to work on diet and exercise  Hyperlipidemia-triglycerides elevated increase Lipitor from 20 to 40 mg daily and follow-up in December.

## 2019-07-08 ENCOUNTER — Other Ambulatory Visit: Payer: Self-pay | Admitting: Internal Medicine

## 2019-07-25 NOTE — Patient Instructions (Addendum)
Change Metformin to glipizide.  Continue to work on diet exercise and weight loss.  Increase Lipitor from 20 to 40 mg daily.  Follow-up at physical exam in December.

## 2019-08-02 ENCOUNTER — Other Ambulatory Visit: Payer: Self-pay | Admitting: Internal Medicine

## 2019-08-06 ENCOUNTER — Telehealth: Payer: Self-pay | Admitting: Internal Medicine

## 2019-08-06 MED ORDER — GLIPIZIDE ER 2.5 MG PO TB24: 2.5000 mg | ORAL_TABLET | Freq: Every day | ORAL | 0 refills | Status: DC

## 2019-08-06 NOTE — Telephone Encounter (Signed)
Shamel Abidi [phone]  [id number] [place], [place]. Phone:  [phone]  Fax:  [phone]        glipiZIDE (GLUCOTROL XL) 2.5 MG 24 hr tablet  Wlliam called to say he only has 2 pills left , so he needs a refill before next appointment.

## 2019-08-13 ENCOUNTER — Other Ambulatory Visit: Payer: Managed Care, Other (non HMO) | Admitting: Internal Medicine

## 2019-08-13 ENCOUNTER — Other Ambulatory Visit: Payer: Self-pay

## 2019-08-13 DIAGNOSIS — E786 Lipoprotein deficiency: Secondary | ICD-10-CM

## 2019-08-13 DIAGNOSIS — I10 Essential (primary) hypertension: Secondary | ICD-10-CM

## 2019-08-13 DIAGNOSIS — E8881 Metabolic syndrome: Secondary | ICD-10-CM

## 2019-08-13 DIAGNOSIS — R7302 Impaired glucose tolerance (oral): Secondary | ICD-10-CM

## 2019-08-13 DIAGNOSIS — Z0000 Encounter for general adult medical examination without abnormal findings: Secondary | ICD-10-CM

## 2019-08-13 DIAGNOSIS — E781 Pure hyperglyceridemia: Secondary | ICD-10-CM

## 2019-08-13 DIAGNOSIS — Z125 Encounter for screening for malignant neoplasm of prostate: Secondary | ICD-10-CM

## 2019-08-14 LAB — LIPID PANEL
Cholesterol: 86 mg/dL (ref <200)
HDL: 25 mg/dL — ABNORMAL LOW (ref >=40)
LDL Cholesterol (Calc): 38 mg/dL (calc)
Non-HDL Cholesterol (Calc): 61 mg/dL (calc) (ref <130)
Total CHOL/HDL Ratio: 3.4 (calc) (ref <5.0)
Triglycerides: 143 mg/dL (ref <150)

## 2019-08-14 LAB — CBC WITH DIFFERENTIAL/PLATELET
Absolute Monocytes: 468 cells/uL (ref 200–950)
Basophils Absolute: 20 cells/uL (ref 0–200)
Basophils Relative: 0.3 %
Eosinophils Absolute: 52 cells/uL (ref 15–500)
Eosinophils Relative: 0.8 %
HCT: 50.1 % — ABNORMAL HIGH (ref 38.5–50.0)
Hemoglobin: 17.5 g/dL — ABNORMAL HIGH (ref 13.2–17.1)
Lymphs Abs: 1710 cells/uL (ref 850–3900)
MCH: 32.9 pg (ref 27.0–33.0)
MCHC: 34.9 g/dL (ref 32.0–36.0)
MCV: 94.2 fL (ref 80.0–100.0)
MPV: 9.9 fL (ref 7.5–12.5)
Monocytes Relative: 7.2 %
Neutro Abs: 4251 cells/uL (ref 1500–7800)
Neutrophils Relative %: 65.4 %
Platelets: 207 10*3/uL (ref 140–400)
RBC: 5.32 10*6/uL (ref 4.20–5.80)
RDW: 13.1 % (ref 11.0–15.0)
Total Lymphocyte: 26.3 %
WBC: 6.5 10*3/uL (ref 3.8–10.8)

## 2019-08-14 LAB — COMPLETE METABOLIC PANEL WITH GFR
AG Ratio: 1.8 (calc) (ref 1.0–2.5)
ALT: 34 U/L (ref 9–46)
AST: 16 U/L (ref 10–35)
Albumin: 4.2 g/dL (ref 3.6–5.1)
Alkaline phosphatase (APISO): 27 U/L — ABNORMAL LOW (ref 35–144)
BUN: 15 mg/dL (ref 7–25)
CO2: 27 mmol/L (ref 20–32)
Calcium: 9.6 mg/dL (ref 8.6–10.3)
Chloride: 105 mmol/L (ref 98–110)
Creat: 1.09 mg/dL (ref 0.70–1.33)
GFR, Est African American: 87 mL/min/{1.73_m2} (ref >=60)
GFR, Est Non African American: 75 mL/min/{1.73_m2} (ref >=60)
Globulin: 2.4 g/dL (calc) (ref 1.9–3.7)
Glucose, Bld: 117 mg/dL — ABNORMAL HIGH (ref 65–99)
Potassium: 4.6 mmol/L (ref 3.5–5.3)
Sodium: 143 mmol/L (ref 135–146)
Total Bilirubin: 0.7 mg/dL (ref 0.2–1.2)
Total Protein: 6.6 g/dL (ref 6.1–8.1)

## 2019-08-14 LAB — HEMOGLOBIN A1C
Hgb A1c MFr Bld: 6 % of total Hgb — ABNORMAL HIGH (ref <5.7)
Mean Plasma Glucose: 126 (calc)
eAG (mmol/L): 7 (calc)

## 2019-08-14 LAB — PSA: PSA: 1.1 ng/mL (ref <=4.0)

## 2019-08-23 ENCOUNTER — Ambulatory Visit (INDEPENDENT_AMBULATORY_CARE_PROVIDER_SITE_OTHER): Payer: Managed Care, Other (non HMO) | Admitting: Internal Medicine

## 2019-08-23 ENCOUNTER — Other Ambulatory Visit: Payer: Self-pay

## 2019-08-23 ENCOUNTER — Encounter: Payer: Self-pay | Admitting: Internal Medicine

## 2019-08-23 VITALS — BP 120/80 | HR 76 | Temp 98.3°F | Ht 73.0 in | Wt 268.0 lb

## 2019-08-23 DIAGNOSIS — E781 Pure hyperglyceridemia: Secondary | ICD-10-CM | POA: Diagnosis not present

## 2019-08-23 DIAGNOSIS — E786 Lipoprotein deficiency: Secondary | ICD-10-CM

## 2019-08-23 DIAGNOSIS — Z87442 Personal history of urinary calculi: Secondary | ICD-10-CM

## 2019-08-23 DIAGNOSIS — I10 Essential (primary) hypertension: Secondary | ICD-10-CM

## 2019-08-23 DIAGNOSIS — R7302 Impaired glucose tolerance (oral): Secondary | ICD-10-CM

## 2019-08-23 DIAGNOSIS — E8881 Metabolic syndrome: Secondary | ICD-10-CM

## 2019-08-23 DIAGNOSIS — Z0000 Encounter for general adult medical examination without abnormal findings: Secondary | ICD-10-CM

## 2019-08-23 DIAGNOSIS — Z23 Encounter for immunization: Secondary | ICD-10-CM | POA: Diagnosis not present

## 2019-08-23 LAB — POCT URINALYSIS DIPSTICK
Appearance: NEGATIVE
Bilirubin, UA: NEGATIVE
Blood, UA: NEGATIVE
Glucose, UA: NEGATIVE
Ketones, UA: NEGATIVE
Leukocytes, UA: NEGATIVE
Nitrite, UA: NEGATIVE
Odor: NEGATIVE
Protein, UA: NEGATIVE
Spec Grav, UA: 1.015 (ref 1.010–1.025)
Urobilinogen, UA: 0.2 E.U./dL
pH, UA: 6.5 (ref 5.0–8.0)

## 2019-08-23 MED ORDER — GLIPIZIDE ER 2.5 MG PO TB24: 2.5000 mg | ORAL_TABLET | Freq: Every day | ORAL | 5 refills | Status: DC

## 2019-08-23 NOTE — Progress Notes (Signed)
   Subjective:    Patient ID: Lance Beard, male    DOB: 1961/06/26, 58 y.o.   MRN: [id number]  HPI 58 year old Male for health maintenance exam and evaluation of medical issues.  He has a history of impaired glucose tolerance, hypertriglyceridemia and hypertension.  Labs reviewed showed normal PSA, hemoglobin A1c excellent at 6%, low HDL of 25.  Triglycerides have improved from 245 to 143 on increased dose of statin medication.  He is on glipizide XL 2.5 mg daily.  He takes baby aspirin daily.  He tried Metformin but it caused diarrhea.  Fasting glucose is 117.  BUN and creatinine are normal.  Liver functions within normal limits.  Has low alkaline phosphatase.  Social history: He is married.  One adopted daughter who is married and resides in the [place] area.  Wife retired is a Art therapist in [employer].  Patient does not smoke or consume alcohol.  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  He serves as a [employer].  History of 3 tubular adenomas removed on colonoscopy with Dr. Henrene Pastor in October 16, 2014.  4 polyps removed on colonoscopy in 10-16-2017 2 of which were adenomatous polyps.  Remote history of kidney stones  Family History: Mother deceased Oct 16, 2017 of respiratory problems.  Sister in good health.  Father deceased with history diabetes hypertension coronary artery disease and renal failure.  Father died of complications of multiple myeloma.    Review of Systems he has had some issues from time to time with pain lateral aspect of right foot.  There is a bony prominence layer?  Old metatarsal fracture.  May want to see podiatrist     Objective:   Physical Exam Blood pressure 120/80 pulse 76 pulse oximetry 97% he is afebrile Skin warm and dry.  Nodes none.  TMs are clear.  Neck is supple without JVD thyromegaly or carotid bruits.  Chest clear to auscultation.  Cardiac exam regular rate and rhythm.  Abdomen soft nondistended without  hepatosplenomegaly masses or tenderness.  No lower extremity edema.  Neuro intact without focal deficits.      Assessment & Plan:  Essential hypertension-stable on current regimen  Impaired glucose tolerance-stable with glipizide.  Intolerant of Metformin  Hypertriglyceridemia-now normal on statin medication  Elevated BMI-encourage diet exercise and weight loss  Right lateral foot bony prominence?  Old metatarsal fracture-is tender with hiking from time to time and may want to see podiatrist  Low HDL cholesterol  Plan: Continue current medications and follow-up in 6 months.  Work on diet exercise and weight loss.  Due to impaired glucose tolerance, Prevnar 13 given today.  Tetanus immunization is up-to-date.  Had flu vaccine in October.

## 2019-09-22 ENCOUNTER — Other Ambulatory Visit: Payer: Self-pay | Admitting: Internal Medicine

## 2020-01-11 ENCOUNTER — Telehealth: Payer: Self-pay | Admitting: Internal Medicine

## 2020-01-11 NOTE — Telephone Encounter (Signed)
Left message to call back  

## 2020-01-11 NOTE — Telephone Encounter (Signed)
Thursaay

## 2020-01-11 NOTE — Telephone Encounter (Signed)
Pt called back and confirmed appt.

## 2020-01-11 NOTE — Telephone Encounter (Signed)
Pt wants to schedule appt, he is having indigestion, fatigue and he said bp might be a little elevated but he hasnt taken it but just going on how he feels. Pt stated this mainly started around Friday. Please advise

## 2020-01-13 ENCOUNTER — Ambulatory Visit (INDEPENDENT_AMBULATORY_CARE_PROVIDER_SITE_OTHER): Payer: Managed Care, Other (non HMO) | Admitting: Internal Medicine

## 2020-01-13 ENCOUNTER — Other Ambulatory Visit: Payer: Self-pay

## 2020-01-13 ENCOUNTER — Encounter: Payer: Self-pay | Admitting: Internal Medicine

## 2020-01-13 VITALS — BP 130/70 | HR 60 | Temp 98.0°F | Ht 73.0 in | Wt 271.0 lb

## 2020-01-13 DIAGNOSIS — R0602 Shortness of breath: Secondary | ICD-10-CM

## 2020-01-13 DIAGNOSIS — Z6835 Body mass index (BMI) 35.0-35.9, adult: Secondary | ICD-10-CM | POA: Diagnosis not present

## 2020-01-13 DIAGNOSIS — R079 Chest pain, unspecified: Secondary | ICD-10-CM

## 2020-01-13 DIAGNOSIS — E781 Pure hyperglyceridemia: Secondary | ICD-10-CM

## 2020-01-13 DIAGNOSIS — I10 Essential (primary) hypertension: Secondary | ICD-10-CM

## 2020-01-13 DIAGNOSIS — R5383 Other fatigue: Secondary | ICD-10-CM | POA: Diagnosis not present

## 2020-01-13 DIAGNOSIS — Z86010 Personal history of colonic polyps: Secondary | ICD-10-CM

## 2020-01-13 DIAGNOSIS — Z87442 Personal history of urinary calculi: Secondary | ICD-10-CM

## 2020-01-13 DIAGNOSIS — R7302 Impaired glucose tolerance (oral): Secondary | ICD-10-CM

## 2020-01-13 MED ORDER — PANTOPRAZOLE SODIUM 40 MG PO TBEC
40.0000 mg | DELAYED_RELEASE_TABLET | Freq: Every day | ORAL | 3 refills | Status: DC
Start: 2020-01-13 — End: 2020-08-23

## 2020-01-13 MED ORDER — NITROGLYCERIN 0.4 MG SL SUBL: 0.4000 mg | SUBLINGUAL_TABLET | SUBLINGUAL | 0 refills | Status: DC | PRN

## 2020-01-13 NOTE — Progress Notes (Signed)
   Subjective:    Patient ID: [name], male    DOB: May 05, 1961, 59 y.o.   MRN: [id number]  HPI 59 year old Male for evaluation of fatigue and vague chest discomfort.  Has not felt well recently.  Tries to walk for exercise.  Has noted some discomfort in the mid substernal area.  No radiation to neck or down the arm.  No associated diaphoresis nausea or vomiting.  There is family history of heart disease in his father.  History of kidney stone 10-25-16 which was calcium oxalate.  He saw urologist.  Patient had colonoscopy with 3 tubular adenomas removed in 2014-10-25.  Repeat colonoscopy in Oct 25, 2017 showed additional polyps 2 of which were adenomatous and repeat colonoscopy was recommended in 3 years.  Patient has history of hypertension and impaired glucose tolerance as well as hyperlipidemia.  Social history: He is married.  1 adopted daughter who is married.  Wife retired as a Art therapist in the [employer].  Patient does not smoke or consume alcohol.  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  He resides near [place].  He raises goats.  Family history: Mother deceased 10/25/2017 of respiratory problems.  Sister in good health.  Father deceased with history of diabetes, renal failure, hypertension with history of coronary artery disease.  Father died of complications of multiple myeloma.    Review of Systems see above     Objective:   Physical Exam  Weight 271 pounds BMI 35.75 blood pressure 130/70 pulse 60 and regular temperature 98 degrees pulse oximetry 97%  Skin warm and dry.  No JVD thyromegaly or carotid bruits.  Chest clear to auscultation without rales or wheezing.  Cardiac exam regular rate and rhythm normal S1 and S2 without murmurs or gallops.  No lower extremity edema.  He seems a bit anxious in describing his symptoms.      Assessment & Plan:  Chest pain and shortness of breath with exertion.  EKG  is within normal limits today.  Family history of coronary artery disease in his father  Hyperlipidemia-on statin medication-lipid panel was normal in December  Hypertension-treated and stable  BMI 35-once again encourage diet exercise and weight loss  Impaired glucose tolerance-hemoglobin A1c in December 2020 was normal at 6% on glipizide.  Does not tolerate Metformin.  GE reflux treated with Protonix  Low HDL of 25  Plan: Patient was given nitroglycerin to keep with him and take at onset of chest pain to see if pain resolves.  Arrange for Cardiology consultation with strong family history in his father and patient's current symptoms.

## 2020-01-14 LAB — COMPLETE METABOLIC PANEL WITH GFR
AG Ratio: 1.9 (calc) (ref 1.0–2.5)
ALT: 34 U/L (ref 9–46)
AST: 15 U/L (ref 10–35)
Albumin: 4.2 g/dL (ref 3.6–5.1)
Alkaline phosphatase (APISO): 27 U/L — ABNORMAL LOW (ref 35–144)
BUN: 17 mg/dL (ref 7–25)
CO2: 26 mmol/L (ref 20–32)
Calcium: 9.5 mg/dL (ref 8.6–10.3)
Chloride: 105 mmol/L (ref 98–110)
Creat: 1.05 mg/dL (ref 0.70–1.33)
GFR, Est African American: 90 mL/min/{1.73_m2} (ref >=60)
GFR, Est Non African American: 78 mL/min/{1.73_m2} (ref >=60)
Globulin: 2.2 g/dL (calc) (ref 1.9–3.7)
Glucose, Bld: 112 mg/dL (ref 65–139)
Potassium: 4.3 mmol/L (ref 3.5–5.3)
Sodium: 140 mmol/L (ref 135–146)
Total Bilirubin: 1 mg/dL (ref 0.2–1.2)
Total Protein: 6.4 g/dL (ref 6.1–8.1)

## 2020-01-14 LAB — CBC WITH DIFFERENTIAL/PLATELET
Absolute Monocytes: 699 cells/uL (ref 200–950)
Basophils Absolute: 32 cells/uL (ref 0–200)
Basophils Relative: 0.5 %
Eosinophils Absolute: 50 cells/uL (ref 15–500)
Eosinophils Relative: 0.8 %
HCT: 50.2 % — ABNORMAL HIGH (ref 38.5–50.0)
Hemoglobin: 17.4 g/dL — ABNORMAL HIGH (ref 13.2–17.1)
Lymphs Abs: 1670 cells/uL (ref 850–3900)
MCH: 32.6 pg (ref 27.0–33.0)
MCHC: 34.7 g/dL (ref 32.0–36.0)
MCV: 94.2 fL (ref 80.0–100.0)
MPV: 10 fL (ref 7.5–12.5)
Monocytes Relative: 11.1 %
Neutro Abs: 3849 cells/uL (ref 1500–7800)
Neutrophils Relative %: 61.1 %
Platelets: 206 10*3/uL (ref 140–400)
RBC: 5.33 10*6/uL (ref 4.20–5.80)
RDW: 13.3 % (ref 11.0–15.0)
Total Lymphocyte: 26.5 %
WBC: 6.3 10*3/uL (ref 3.8–10.8)

## 2020-01-14 LAB — T4, FREE: Free T4: 1.2 ng/dL (ref 0.8–1.8)

## 2020-01-14 LAB — SEDIMENTATION RATE: Sed Rate: 2 mm/h (ref 0–20)

## 2020-01-14 LAB — TSH: TSH: 1.74 mIU/L (ref 0.40–4.50)

## 2020-01-15 NOTE — Patient Instructions (Signed)
Try nitroglycerin at onset of chest pain to see if it helps.  Cardiology consultation will be requested.  Present to the ED if you have prolonged substernal chest pain prior to cardiology consultation.  Continue current medications.

## 2020-01-25 ENCOUNTER — Other Ambulatory Visit: Payer: Self-pay | Admitting: Internal Medicine

## 2020-02-01 ENCOUNTER — Other Ambulatory Visit: Payer: Self-pay | Admitting: Internal Medicine

## 2020-02-07 ENCOUNTER — Other Ambulatory Visit: Payer: Self-pay | Admitting: Internal Medicine

## 2020-02-18 ENCOUNTER — Other Ambulatory Visit: Payer: Managed Care, Other (non HMO) | Admitting: Internal Medicine

## 2020-02-18 ENCOUNTER — Other Ambulatory Visit: Payer: Self-pay

## 2020-02-18 DIAGNOSIS — E781 Pure hyperglyceridemia: Secondary | ICD-10-CM

## 2020-02-18 DIAGNOSIS — R7302 Impaired glucose tolerance (oral): Secondary | ICD-10-CM

## 2020-02-18 DIAGNOSIS — E786 Lipoprotein deficiency: Secondary | ICD-10-CM

## 2020-02-19 LAB — HEPATIC FUNCTION PANEL
AG Ratio: 1.8 (calc) (ref 1.0–2.5)
ALT: 39 U/L (ref 9–46)
AST: 19 U/L (ref 10–35)
Albumin: 4.2 g/dL (ref 3.6–5.1)
Alkaline phosphatase (APISO): 28 U/L — ABNORMAL LOW (ref 35–144)
Bilirubin, Direct: 0.2 mg/dL (ref 0.0–0.2)
Globulin: 2.3 g/dL (calc) (ref 1.9–3.7)
Indirect Bilirubin: 0.9 mg/dL (calc) (ref 0.2–1.2)
Total Bilirubin: 1.1 mg/dL (ref 0.2–1.2)
Total Protein: 6.5 g/dL (ref 6.1–8.1)

## 2020-02-19 LAB — LIPID PANEL
Cholesterol: 94 mg/dL (ref <200)
HDL: 25 mg/dL — ABNORMAL LOW (ref >=40)
LDL Cholesterol (Calc): 41 mg/dL (calc)
Non-HDL Cholesterol (Calc): 69 mg/dL (calc) (ref <130)
Total CHOL/HDL Ratio: 3.8 (calc) (ref <5.0)
Triglycerides: 215 mg/dL — ABNORMAL HIGH (ref <150)

## 2020-02-19 LAB — HEMOGLOBIN A1C
Hgb A1c MFr Bld: 6 % of total Hgb — ABNORMAL HIGH (ref <5.7)
Mean Plasma Glucose: 126 (calc)
eAG (mmol/L): 7 (calc)

## 2020-02-22 ENCOUNTER — Encounter: Payer: Self-pay | Admitting: Internal Medicine

## 2020-02-22 ENCOUNTER — Other Ambulatory Visit: Payer: Self-pay

## 2020-02-22 ENCOUNTER — Ambulatory Visit: Payer: Managed Care, Other (non HMO) | Admitting: Internal Medicine

## 2020-02-22 VITALS — BP 140/70 | HR 82 | Ht 73.0 in | Wt 274.0 lb

## 2020-02-22 DIAGNOSIS — I10 Essential (primary) hypertension: Secondary | ICD-10-CM | POA: Diagnosis not present

## 2020-02-22 DIAGNOSIS — E781 Pure hyperglyceridemia: Secondary | ICD-10-CM | POA: Diagnosis not present

## 2020-02-22 DIAGNOSIS — R7302 Impaired glucose tolerance (oral): Secondary | ICD-10-CM | POA: Diagnosis not present

## 2020-02-22 DIAGNOSIS — Z6836 Body mass index (BMI) 36.0-36.9, adult: Secondary | ICD-10-CM

## 2020-02-22 DIAGNOSIS — M5416 Radiculopathy, lumbar region: Secondary | ICD-10-CM

## 2020-02-22 MED ORDER — MELOXICAM 15 MG PO TABS
15.0000 mg | ORAL_TABLET | Freq: Every day | ORAL | 1 refills | Status: DC
Start: 2020-02-22 — End: 2020-04-15

## 2020-02-22 NOTE — Patient Instructions (Signed)
Continue to work on diet and exercise once back pain improves.  See cardiologist soon regarding chest discomfort with family history of heart disease.  Monitor blood pressure at home.  Take Mobic 15 mg daily for back pain/radiculopathy.  Continue current medications.  Physical exam due in 6 months.

## 2020-02-22 NOTE — Progress Notes (Signed)
   Subjective:    Patient ID: Lance Beard, male    DOB: May 23, 1961, 59 y.o.   MRN: [id number]  HPI 59 year old Male in for follow up of impaired glucose tolerance, essential HTN,sleep apnea, hypertriglyceridemia. Unfortunately, ate barbecue the evening before his lab draw.  His triglycerides are elevated at 215 and 1 previously 143 in December 2020.  He is scheduled to see Dr. Irish Lack, Cardiologist, soon for some chest discomfort with family history of coronary disease.  Regarding impaired glucose tolerance, hemoglobin A1c is excellent at 6%.  Liver functions are normal.  He is known generic Lipitor 40 mg daily.  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  Is having some radiation of pain into his right lower leg.  Gets worse with prolonged sitting.   Review of Systems-6 pound weight gain since December 2020.     Objective:   Physical Exam Blood pressure today 140/70 BMI 36.15 pulse 82 pulse oximetry 97% weight 274 pounds.  Has actually gained 6 pounds since December 2020. Skin warm and dry.  Neck is supple without JVD thyromegaly or carotid bruits.  Chest clear to auscultation.  Cardiac exam regular rate and rhythm.  Straight leg raising is negative on the right at 90 degrees.  Deep tendon reflexe 2+ and symmetrical in the right knee.  Muscle strength in the right lower extremity is normal.      Assessment & Plan:  Right lumbar radiculopathy and back strain-says he has had this issue before.  Have prescribed meloxicam 15 mg daily.  Avoid heavy lifting for 2 weeks if at all possible.  May apply heat or ice to right lower back.  No numbness or tingling in the right lower extremity.  Elevated triglycerides-he is on Lipitor 40 mg daily.  Needs to watch diet exercise and lose weight.  Defer further management to cardiologist.  Chest discomfort-family history of coronary disease-will be seeing cardiologist soon.  No prolonged chest pain.  Takes  81 mg of aspirin daily.  Essential hypertension-I think he would benefit from weight loss.  Blood pressure stable.  At last visit in May it was 130/70.  Currently only on Tenormin 25 mg daily.  We could add Norvasc if blood pressure remains elevated.  Impaired glucose tolerance-he is on Glucotrol XL 2.5 mg daily with good control.  History of GE reflux treated with Protonix.

## 2020-03-02 ENCOUNTER — Encounter: Payer: Self-pay | Admitting: Interventional Cardiology

## 2020-03-02 ENCOUNTER — Other Ambulatory Visit: Payer: Self-pay

## 2020-03-02 ENCOUNTER — Ambulatory Visit: Payer: Managed Care, Other (non HMO) | Admitting: Interventional Cardiology

## 2020-03-02 VITALS — BP 134/68 | HR 71 | Ht 73.0 in | Wt 276.0 lb

## 2020-03-02 DIAGNOSIS — E782 Mixed hyperlipidemia: Secondary | ICD-10-CM | POA: Diagnosis not present

## 2020-03-02 DIAGNOSIS — R072 Precordial pain: Secondary | ICD-10-CM | POA: Diagnosis not present

## 2020-03-02 DIAGNOSIS — I10 Essential (primary) hypertension: Secondary | ICD-10-CM | POA: Diagnosis not present

## 2020-03-02 DIAGNOSIS — Z01812 Encounter for preprocedural laboratory examination: Secondary | ICD-10-CM

## 2020-03-02 NOTE — Progress Notes (Addendum)
Cardiology Office Note   Date:  03/02/2020   ID:  Lance Beard, DOB 04-16-1961, MRN [id number]  PCP:  Elby Showers, MD    No chief complaint on file.  Chest pain, exertional  Wt Readings from Last 3 Encounters:  03/02/20 276 lb (125.2 kg)  02/22/20 274 lb (124.3 kg)  01/13/20 271 lb (122.9 kg)       History of Present Illness: Lance Beard is a 59 y.o. male who is being seen today for the evaluation of chest pain at the request of Baxley, Cresenciano Lick, MD.  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  Records from May 2021 showed: "Chest pain and shortness of breath with exertion.  EKG is within normal limits today.  Family history of coronary artery disease in his father  Hyperlipidemia-on statin medication-lipid panel was normal in December  Hypertension-treated and stable  BMI 35-once again encourage diet exercise and weight loss  Impaired glucose tolerance-hemoglobin A1c in December 2020 was normal at 6% on glipizide.  Does not tolerate Metformin."  He felt BP was up at that time and he did not feel well.   More recently, he has felt better. He was treated for acid reflux and had some improvement, but not back to normal completely.  Still has some DOE and chest sensations on occasion with exertion.  He walks about 30 min/day. Takes care of some animals.   He did not have to use NTG.       Past Medical History:  Diagnosis Date   Chronic kidney disease    kidney stones x2   Hx of cardiovascular stress test    a. ETT-MV 3/14:  No scar or ischemia, EF 62%   Hyperlipidemia    Hypertension    PONV (postoperative nausea and vomiting)    with one surgery   Sleep apnea    CPAP    Past Surgical History:  Procedure Laterality Date   APPENDECTOMY     COLONOSCOPY     NASAL SEPTUM SURGERY     VARICOCELE EXCISION       Current Outpatient Medications  Medication Sig Dispense Refill   aspirin 81  MG tablet Take 81 mg by mouth daily.       atenolol (TENORMIN) 25 MG tablet TAKE 1 TABLET BY MOUTH EVERY DAY 30 tablet 4   atorvastatin (LIPITOR) 40 MG tablet TAKE 1 TABLET (40 MG TOTAL) BY MOUTH DAILY. DISCONTINUE LIPITOR 20MG . 90 tablet 3   B Complex-C (B-COMPLEX WITH VITAMIN C) tablet Take 1 tablet by mouth daily.     glipiZIDE (GLUCOTROL XL) 2.5 MG 24 hr tablet TAKE 1 TABLET (2.5 MG TOTAL) BY MOUTH DAILY WITH BREAKFAST. 90 tablet 1   meloxicam (MOBIC) 15 MG tablet Take 1 tablet (15 mg total) by mouth daily. 30 tablet 1   nitroGLYCERIN (NITROSTAT) 0.4 MG SL tablet Place 1 tablet (0.4 mg total) under the tongue every 5 (five) minutes as needed for chest pain. 50 tablet 0   pantoprazole (PROTONIX) 40 MG tablet Take 1 tablet (40 mg total) by mouth daily. 30 tablet 3   No current facility-administered medications for this visit.    Allergies:   Patient has no known allergies.    Social History:  The patient  reports that he has never smoked. He has never used smokeless tobacco. He reports that he does not drink alcohol and does not use drugs.  Family History:  The patient's family history includes CAD in his father; Diabetes in his father; Hypertension in his father; Lung cancer in his father.    ROS:  Please see the history of present illness.   Otherwise, review of systems are positive for sedentary job.   All other systems are reviewed and negative.    PHYSICAL EXAM: VS:  BP 134/68    Pulse 71    Ht 6\' 1"  (1.854 m)    Wt 276 lb (125.2 kg)    SpO2 95%    BMI 36.41 kg/m  , BMI Body mass index is 36.41 kg/m. GEN: Well nourished, well developed, in no acute distress  HEENT: normal  Neck: no JVD, carotid bruits, or masses Cardiac: RRR; no murmurs, rubs, or gallops,no edema  Respiratory:  clear to auscultation bilaterally, normal work of breathing GI: soft, nontender, nondistended, + BS MS: no deformity or atrophy  Skin: warm and dry, no rash Neuro:  Strength and sensation are  intact Psych: euthymic mood, full affect   EKG:   The ekg ordered today demonstrates NSR, no ST changes   Recent Labs: 01/13/2020: BUN 17; Creat 1.05; Hemoglobin 17.4; Platelets 206; Potassium 4.3; Sodium 140; TSH 1.74 02/18/2020: ALT 39   Lipid Panel    Component Value Date/Time   CHOL 94 02/18/2020 0924   TRIG 215 (H) 02/18/2020 0924   HDL 25 (L) 02/18/2020 0924   CHOLHDL 3.8 02/18/2020 0924   VLDL 22 10/15/2016 1008   LDLCALC 41 02/18/2020 0924     Other studies Reviewed: Additional studies/ records that were reviewed today with results demonstrating: 2014 stress test negative.   ASSESSMENT AND PLAN:  1. Exertional chest pain: Some typical features and some atypical features.  Plan for coronary CTA. Can take atenolol 50 mg the day of the test.  2. Obesity: Whole food plant based diet. Avoid processed foods. 3. DM2: A1C 6.0 on glipizide.  High fiber diet.  4. HTN: Atenolol to help.  The current medical regimen is effective;  continue present plan and medications. 5. LDL 41 in 6/21. COntinue statin for hyperlipidemia.   Current medicines are reviewed at length with the patient today.  The patient concerns regarding his medicines were addressed.  The following changes have been made:  No change  Labs/ tests ordered today include:  No orders of the defined types were placed in this encounter.   Recommend 150 minutes/week of aerobic exercise Low fat, low carb, high fiber diet recommended  Disposition:   FU for CTA   Signed, Larae Grooms, MD  03/02/2020 2:21 PM    [facility] [place], [place], [place]  [address] Phone: [phone]; Fax: [phone]

## 2020-03-02 NOTE — Patient Instructions (Addendum)
Medication Instructions:  Your physician recommends that you continue on your current medications as directed. Please refer to the Current Medication list given to you today.  *If you need a refill on your cardiac medications before your next appointment, please call your pharmacy*   Lab Work: NONE  If you have labs (blood work) drawn today and your tests are completely normal, you will receive your results only by: Marland Kitchen MyChart Message (if you have MyChart) OR . A paper copy in the mail If you have any lab test that is abnormal or we need to change your treatment, we will call you to review the results.   Testing/Procedures: Your physician has requested that you have cardiac CT.   Follow-Up: At [facility], you and your health needs are our priority.  As part of our continuing mission to provide you with exceptional heart care, we have created designated Provider Care Teams.  These Care Teams include your primary Cardiologist (physician) and Advanced Practice Providers (APPs -  Physician Assistants and Nurse Practitioners) who all work together to provide you with the care you need, when you need it.  We recommend signing up for the patient portal called "MyChart".  Sign up information is provided on this After Visit Summary.  MyChart is used to connect with patients for Virtual Visits (Telemedicine).  Patients are able to view lab/test results, encounter notes, upcoming appointments, etc.  Non-urgent messages can be sent to your provider as well.   To learn more about what you can do with MyChart, go to [email or web address].    Your next appointment:   12 month(s)  The format for your next appointment:   In Person  Provider:   You may see Larae Grooms, MD or one of the following Advanced Practice Providers on your designated Care Team:    Melina Copa, PA-C  Ermalinda Barrios, PA-C    Other Instructions  Your cardiac CT will be scheduled at one of the below locations:    [facility] [address] [place], [place] [address] [phone]  [place] [address] [place], [place] [address] [phone]  If scheduled at [facility], please arrive at the [facility] main entrance of [facility] 30 minutes prior to test start time. Proceed to the [facility] Radiology Department (first floor) to check-in and test prep.  If scheduled at [facility], please arrive 15 mins early for check-in and test prep.  Please follow these instructions carefully (unless otherwise directed):  Hold all erectile dysfunction medications at least 3 days (72 hrs) prior to test.  On the Night Before the Test: . Be sure to Drink plenty of water. . Do not consume any caffeinated/decaffeinated beverages or chocolate 12 hours prior to your test. . Do not take any antihistamines 12 hours prior to your test.   On the Day of the Test: . Drink plenty of water. Do not drink any water within one hour of the test. . Do not eat any food 4 hours prior to the test. . You may take your regular medications prior to the test.  . Take a total of 20m of atenolol two hours prior to test. . HOLD Glipizide morning of the test.     After the Test: . Drink plenty of water. . After receiving IV contrast, you may experience a mild flushed feeling. This is normal. . On occasion, you may experience a mild rash  up to 24 hours after the test. This is not dangerous. If this occurs, you can take Benadryl 25 mg and increase your fluid intake. . If you experience trouble breathing, this can be serious. If it is severe call 911 IMMEDIATELY. If it is mild, please call our office.   Once we have confirmed authorization from your insurance company, we will call you to set up a date and time for your test. Based on how quickly your insurance processes prior authorizations requests, please allow up  to 4 weeks to be contacted for scheduling your Cardiac CT appointment. Be advised that routine Cardiac CT appointments could be scheduled as many as 8 weeks after your provider has ordered it.  For non-scheduling related questions, please contact the cardiac imaging nurse navigator should you have any questions/concerns: Marchia Bond, Cardiac Imaging Nurse Navigator Burley Saver, Interim Cardiac Imaging Nurse [facility] Direct Office Dial: [phone]   For scheduling needs, including cancellations and rescheduling, please call Vivien Rota at [phone], option 3.

## 2020-03-15 ENCOUNTER — Telehealth (HOSPITAL_COMMUNITY): Payer: Self-pay | Admitting: *Deleted

## 2020-03-15 NOTE — Telephone Encounter (Signed)

## 2020-03-16 ENCOUNTER — Ambulatory Visit
Admission: RE | Admit: 2020-03-16 | Discharge: 2020-03-16 | Disposition: A | Discharge status: Home / Self Care | Payer: Managed Care, Other (non HMO) | Source: Ambulatory Visit | Attending: Interventional Cardiology | Admitting: Interventional Cardiology

## 2020-03-16 ENCOUNTER — Other Ambulatory Visit: Payer: Self-pay

## 2020-03-16 DIAGNOSIS — R072 Precordial pain: Secondary | ICD-10-CM

## 2020-03-16 LAB — POCT I-STAT CREATININE: Creatinine, Ser: 1.1 mg/dL (ref 0.61–1.24)

## 2020-03-16 MED ORDER — DILTIAZEM HCL 25 MG/5ML IV SOLN
10.0000 mg | Freq: Once | INTRAVENOUS | Status: AC
  Administered 2020-03-16: 10 mg via INTRAVENOUS

## 2020-03-16 MED ORDER — NITROGLYCERIN 0.4 MG SL SUBL
0.8000 mg | SUBLINGUAL_TABLET | Freq: Once | SUBLINGUAL | Status: AC
  Administered 2020-03-16: 0.8 mg via SUBLINGUAL

## 2020-03-16 MED ORDER — METOPROLOL TARTRATE 5 MG/5ML IV SOLN
10.0000 mg | Freq: Once | INTRAVENOUS | Status: AC
  Administered 2020-03-16: 10 mg via INTRAVENOUS

## 2020-03-16 MED ORDER — DILTIAZEM LOAD VIA INFUSION: 10.0000 mg | Freq: Once | INTRAVENOUS | Status: DC

## 2020-03-16 MED ORDER — IOHEXOL 350 MG/ML SOLN
90.0000 mL | Freq: Once | INTRAVENOUS | Status: AC | PRN
  Administered 2020-03-16: 90 mL via INTRAVENOUS

## 2020-03-20 ENCOUNTER — Telehealth: Payer: Self-pay | Admitting: Interventional Cardiology

## 2020-03-20 NOTE — Telephone Encounter (Signed)
Left message to call office

## 2020-03-20 NOTE — Telephone Encounter (Signed)
I spoke with patient and reviewed cardiac CT results with him

## 2020-03-20 NOTE — Telephone Encounter (Signed)
Patient is returning call from Friday regarding CTA results. Please call.

## 2020-04-10 ENCOUNTER — Other Ambulatory Visit: Payer: Self-pay

## 2020-04-10 ENCOUNTER — Ambulatory Visit: Payer: Managed Care, Other (non HMO) | Admitting: Internal Medicine

## 2020-04-10 ENCOUNTER — Encounter: Payer: Self-pay | Admitting: Internal Medicine

## 2020-04-10 ENCOUNTER — Telehealth: Payer: Self-pay | Admitting: Internal Medicine

## 2020-04-10 VITALS — BP 140/60 | HR 71 | Ht 73.0 in | Wt 267.0 lb

## 2020-04-10 DIAGNOSIS — M5431 Sciatica, right side: Secondary | ICD-10-CM

## 2020-04-10 NOTE — Telephone Encounter (Signed)
Scheduled

## 2020-04-10 NOTE — Telephone Encounter (Signed)
Will he come back to discuss options. He can call his insurance company to see if an MRI is covered or does he have to go to PT for 3 weeks first.

## 2020-04-10 NOTE — Telephone Encounter (Signed)
Lance Beard [phone]  [name] called to say his lower back pain has come back, the medicine helped for a little while. The pain sometimes runs down his right leg, it seems to be worse than it has been and the medicine is not working this time.

## 2020-04-11 ENCOUNTER — Telehealth: Payer: Self-pay | Admitting: Internal Medicine

## 2020-04-11 MED ORDER — HYDROCODONE-ACETAMINOPHEN 5-325 MG PO TABS: 1.0000 | ORAL_TABLET | Freq: Three times a day (TID) | ORAL | 0 refills | Status: DC | PRN

## 2020-04-11 MED ORDER — PREDNISONE 10 MG PO TABS: ORAL_TABLET | ORAL | 0 refills | Status: DC

## 2020-04-11 MED ORDER — CYCLOBENZAPRINE HCL 10 MG PO TABS
ORAL_TABLET | ORAL | 0 refills | Status: DC
Start: 2020-04-11 — End: 2020-08-23

## 2020-04-11 NOTE — Patient Instructions (Signed)
Take prednisone in tapering course as directed and take extra rest of prednisone with you on your trip in case you need it once again in a tapering course.  Discontinue meloxicam.  Take Flexeril 1/2 to 1 tablet at bedtime.  May take hydrocodone APAP 5/325 sparingly for moderate to severe back pain.  All of this should help you get through your trip to the [facility].  If pain is persistent when you return home we will order physical therapy.

## 2020-04-11 NOTE — Telephone Encounter (Signed)
Please apologize. It was pended but I needed to sign orders especially narcotic which could not be sent by [facility]. I have done these just now.

## 2020-04-11 NOTE — Telephone Encounter (Signed)
Rosana Hoes called to check on medications from yesterdays appointment, he said he thought he was getting some prednisone, hydrocodone and a muscle relaxer sent to his pharmacy.  [id number], [place]. Phone:  [phone]  Fax:  [phone]

## 2020-04-11 NOTE — Progress Notes (Signed)
   Subjective:    Patient ID: Lance Beard, male    DOB: Oct 07, 1960, 59 y.o.   MRN: [id number]  HPI 59 year old Male recently seen by Dr. Irish Lack for chest discomfort.  Coronary calcium score was 78.72 which was 69th percentile for age and sex matched control.  Patient was advised to get 150 minutes of exercise weekly.  Was not fasting continue with lipid management with Lipitor.  Regarding hypertension he was advised to continue blood pressure control and glucose control.  He had a normal EKG.  Advised to take atenolol 25 mg daily in addition to current medications and continue statin for hyperlipidemia.  Advised to follow low-fat low-carb and high-fiber diet.  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  Was having right lower back pain with some radiation of pain into right lower leg worse with prolonged sitting.  Was diagnosed with right lumbar radiculopathy and back strain.  Prescribed meloxicam 15 mg daily and was advised to avoid heavy lifting for 2 weeks.  He remains overweight with BMI 36.15.  Muscle strength was normal and straight leg raising test was negative on the right at 90 degrees.  He called on August 16 saying his back pain had come back with right radiculopathy.  Felt like it was a bit worse than what it was previously and the medication was not helping.  Office visit was advised.  He is going on vacation to the [facility] and plans to do some hiking.  He is concerned about prolonged plane ride and activities during vacation.    Review of Systems see above     Objective:   Physical Exam Blood pressure 140/60 pulse 71 pulse oximetry 97% weight 267 pounds BMI 35.23  Straight leg raising is negative bilaterally at 90 degrees.  Muscle strength is normal in both lower extremities.  Range of motion in the trunk is failure     Assessment & Plan:  Right sciatica  Plan: Patient will take prednisone in tapering course  starting with 6 tablets day 1 and decreasing by 10 mg daily [id number] taper.  He will have an additional taper to take with him on vacation.  He will discontinue meloxicam.  He will take hydrocodone APAP sparingly every 8 hours as needed for moderate to severe back pain.  He will also take Flexeril 10 mg tablets 1/2 to 1 tablet at bedtime.  If back pain persist when he returns from vacation he will need to attend physical therapy.

## 2020-04-15 ENCOUNTER — Other Ambulatory Visit: Payer: Self-pay | Admitting: Internal Medicine

## 2020-06-05 ENCOUNTER — Other Ambulatory Visit: Payer: Self-pay | Admitting: Internal Medicine

## 2020-06-06 ENCOUNTER — Other Ambulatory Visit: Payer: Self-pay | Admitting: Internal Medicine

## 2020-06-14 ENCOUNTER — Telehealth: Payer: Self-pay | Admitting: Internal Medicine

## 2020-06-14 NOTE — Telephone Encounter (Signed)
LVM to CB about possible changing CPE to 08/23/2020 instead of 08/24/2020

## 2020-06-15 NOTE — Telephone Encounter (Signed)
Pt CB and rescheduled

## 2020-07-04 LAB — HM DIABETES EYE EXAM

## 2020-07-05 ENCOUNTER — Encounter: Payer: Self-pay | Admitting: Internal Medicine

## 2020-08-17 ENCOUNTER — Other Ambulatory Visit: Payer: Managed Care, Other (non HMO) | Admitting: Internal Medicine

## 2020-08-17 ENCOUNTER — Other Ambulatory Visit: Payer: Self-pay

## 2020-08-17 DIAGNOSIS — E8881 Metabolic syndrome: Secondary | ICD-10-CM

## 2020-08-17 DIAGNOSIS — Z6835 Body mass index (BMI) 35.0-35.9, adult: Secondary | ICD-10-CM

## 2020-08-17 DIAGNOSIS — R0602 Shortness of breath: Secondary | ICD-10-CM

## 2020-08-17 DIAGNOSIS — E781 Pure hyperglyceridemia: Secondary | ICD-10-CM

## 2020-08-17 DIAGNOSIS — Z860101 Personal history of adenomatous and serrated colon polyps: Secondary | ICD-10-CM

## 2020-08-17 DIAGNOSIS — Z86010 Personal history of colonic polyps: Secondary | ICD-10-CM

## 2020-08-17 DIAGNOSIS — Z0000 Encounter for general adult medical examination without abnormal findings: Secondary | ICD-10-CM

## 2020-08-17 DIAGNOSIS — I10 Essential (primary) hypertension: Secondary | ICD-10-CM

## 2020-08-17 DIAGNOSIS — R7302 Impaired glucose tolerance (oral): Secondary | ICD-10-CM

## 2020-08-17 DIAGNOSIS — R5383 Other fatigue: Secondary | ICD-10-CM

## 2020-08-17 DIAGNOSIS — Z87442 Personal history of urinary calculi: Secondary | ICD-10-CM

## 2020-08-17 DIAGNOSIS — Z6836 Body mass index (BMI) 36.0-36.9, adult: Secondary | ICD-10-CM

## 2020-08-18 LAB — COMPLETE METABOLIC PANEL WITH GFR
AG Ratio: 1.9 (calc) (ref 1.0–2.5)
ALT: 50 U/L — ABNORMAL HIGH (ref 9–46)
AST: 21 U/L (ref 10–35)
Albumin: 4.3 g/dL (ref 3.6–5.1)
Alkaline phosphatase (APISO): 28 U/L — ABNORMAL LOW (ref 35–144)
BUN: 17 mg/dL (ref 7–25)
CO2: 28 mmol/L (ref 20–32)
Calcium: 9.3 mg/dL (ref 8.6–10.3)
Chloride: 102 mmol/L (ref 98–110)
Creat: 1.06 mg/dL (ref 0.70–1.33)
GFR, Est African American: 89 mL/min/{1.73_m2} (ref >=60)
GFR, Est Non African American: 77 mL/min/{1.73_m2} (ref >=60)
Globulin: 2.3 g/dL (calc) (ref 1.9–3.7)
Glucose, Bld: 146 mg/dL — ABNORMAL HIGH (ref 65–99)
Potassium: 4.4 mmol/L (ref 3.5–5.3)
Sodium: 141 mmol/L (ref 135–146)
Total Bilirubin: 1 mg/dL (ref 0.2–1.2)
Total Protein: 6.6 g/dL (ref 6.1–8.1)

## 2020-08-18 LAB — CBC WITH DIFFERENTIAL/PLATELET
Absolute Monocytes: 524 cells/uL (ref 200–950)
Basophils Absolute: 21 cells/uL (ref 0–200)
Basophils Relative: 0.3 %
Eosinophils Absolute: 62 cells/uL (ref 15–500)
Eosinophils Relative: 0.9 %
HCT: 49.5 % (ref 38.5–50.0)
Hemoglobin: 17.5 g/dL — ABNORMAL HIGH (ref 13.2–17.1)
Lymphs Abs: 1601 cells/uL (ref 850–3900)
MCH: 33.7 pg — ABNORMAL HIGH (ref 27.0–33.0)
MCHC: 35.4 g/dL (ref 32.0–36.0)
MCV: 95.4 fL (ref 80.0–100.0)
MPV: 10 fL (ref 7.5–12.5)
Monocytes Relative: 7.6 %
Neutro Abs: 4692 cells/uL (ref 1500–7800)
Neutrophils Relative %: 68 %
Platelets: 198 10*3/uL (ref 140–400)
RBC: 5.19 10*6/uL (ref 4.20–5.80)
RDW: 12.8 % (ref 11.0–15.0)
Total Lymphocyte: 23.2 %
WBC: 6.9 10*3/uL (ref 3.8–10.8)

## 2020-08-18 LAB — HEMOGLOBIN A1C
Hgb A1c MFr Bld: 6.7 % of total Hgb — ABNORMAL HIGH (ref <5.7)
Mean Plasma Glucose: 146 mg/dL
eAG (mmol/L): 8.1 mmol/L

## 2020-08-18 LAB — MICROALBUMIN / CREATININE URINE RATIO
Creatinine, Urine: 191 mg/dL (ref 20–320)
Microalb Creat Ratio: 4 mcg/mg creat (ref <30)
Microalb, Ur: 0.7 mg/dL

## 2020-08-18 LAB — LIPID PANEL
Cholesterol: 105 mg/dL (ref <200)
HDL: 28 mg/dL — ABNORMAL LOW (ref >=40)
LDL Cholesterol (Calc): 47 mg/dL (calc)
Non-HDL Cholesterol (Calc): 77 mg/dL (calc) (ref <130)
Total CHOL/HDL Ratio: 3.8 (calc) (ref <5.0)
Triglycerides: 238 mg/dL — ABNORMAL HIGH (ref <150)

## 2020-08-18 LAB — PSA: PSA: 1.13 ng/mL (ref <=4.0)

## 2020-08-23 ENCOUNTER — Other Ambulatory Visit: Payer: Self-pay

## 2020-08-23 ENCOUNTER — Encounter: Payer: Self-pay | Admitting: Internal Medicine

## 2020-08-23 ENCOUNTER — Ambulatory Visit (INDEPENDENT_AMBULATORY_CARE_PROVIDER_SITE_OTHER): Payer: Managed Care, Other (non HMO) | Admitting: Internal Medicine

## 2020-08-23 VITALS — BP 130/70 | HR 70 | Ht 73.0 in | Wt 272.0 lb

## 2020-08-23 DIAGNOSIS — R7302 Impaired glucose tolerance (oral): Secondary | ICD-10-CM | POA: Diagnosis not present

## 2020-08-23 DIAGNOSIS — E8881 Metabolic syndrome: Secondary | ICD-10-CM

## 2020-08-23 DIAGNOSIS — E786 Lipoprotein deficiency: Secondary | ICD-10-CM

## 2020-08-23 DIAGNOSIS — E781 Pure hyperglyceridemia: Secondary | ICD-10-CM

## 2020-08-23 DIAGNOSIS — I10 Essential (primary) hypertension: Secondary | ICD-10-CM

## 2020-08-23 DIAGNOSIS — Z87442 Personal history of urinary calculi: Secondary | ICD-10-CM

## 2020-08-23 DIAGNOSIS — Z6835 Body mass index (BMI) 35.0-35.9, adult: Secondary | ICD-10-CM | POA: Diagnosis not present

## 2020-08-23 DIAGNOSIS — Z0000 Encounter for general adult medical examination without abnormal findings: Secondary | ICD-10-CM | POA: Diagnosis not present

## 2020-08-23 DIAGNOSIS — Z86010 Personal history of colonic polyps: Secondary | ICD-10-CM

## 2020-08-23 DIAGNOSIS — B356 Tinea cruris: Secondary | ICD-10-CM

## 2020-08-23 LAB — POCT URINALYSIS DIPSTICK
Appearance: NEGATIVE
Bilirubin, UA: NEGATIVE
Blood, UA: NEGATIVE
Glucose, UA: POSITIVE — AB
Ketones, UA: NEGATIVE
Leukocytes, UA: NEGATIVE
Nitrite, UA: NEGATIVE
Odor: NEGATIVE
Protein, UA: NEGATIVE
Spec Grav, UA: 1.015 (ref 1.010–1.025)
Urobilinogen, UA: 0.2 E.U./dL
pH, UA: 6 (ref 5.0–8.0)

## 2020-08-23 MED ORDER — ATORVASTATIN CALCIUM 80 MG PO TABS
80.0000 mg | ORAL_TABLET | Freq: Every day | ORAL | 1 refills | Status: DC
Start: 2020-08-23 — End: 2020-11-03

## 2020-08-23 MED ORDER — NYSTATIN-TRIAMCINOLONE 100000-0.1 UNIT/GM-% EX CREA: TOPICAL_CREAM | CUTANEOUS | 0 refills | Status: DC

## 2020-08-23 MED ORDER — SITAGLIPTIN PHOSPHATE 100 MG PO TABS
100.0000 mg | ORAL_TABLET | Freq: Every day | ORAL | 0 refills | Status: DC
Start: 2020-08-23 — End: 2020-10-17

## 2020-08-23 MED ORDER — SITAGLIPTIN PHOSPHATE 50 MG PO TABS
50.0000 mg | ORAL_TABLET | Freq: Every day | ORAL | 1 refills | Status: DC
Start: 2020-08-23 — End: 2020-08-23

## 2020-08-23 NOTE — Patient Instructions (Addendum)
Begin Januvia 100 mg daily. Purchase home accucheck machine and test twice daily. Discontinue Glipizide. Increase Lipitor to 80 mg daily. RTC in 6 weeks for follow up.

## 2020-08-23 NOTE — Progress Notes (Signed)
Subjective:    Patient ID: Lance Beard, male    DOB: [birth year], 59 y.o.   MRN: [id number]  HPI 59 year old Male seen for health maintenance exam and evaluation of medical issues.  Has had 3 Moderna vaccines for Covid-19.  Busy with his regular job  and also serving on [employer].  Hx of impaired glucose tolerance, hypertension, and hypertriglyceridemia. His hemoglobin A1c has increased from 6% to 6.7%.  We are going to change from glipizide to Januvia 100 mg daily.  Metformin caused diarrhea.  Triglycerides have gone up from 2 15-2 38.  He has a low HDL of 28.  Total cholesterol is normal.  LDL cholesterol is normal.  We are going to increase generic Lipitor to 80 mg daily.  We need to get his lipids under good control with his family history of heart issues.  He has been having some jock itch in his scrotum.  Elevated glucose aggravates this.  Have prescribed Mycolog cream twice a day.  He has history of 3 tubular adenomas removed on colonoscopy with Dr. Henrene Pastor in 2016.  4 polyps removed on colonoscopy in 2019, 2 of which were adenomatous polyps.  Remote history of kidney stones.  Family history: Father deceased with history of diabetes, hypertension, coronary disease and renal failure.  Father died of complications of multiple myeloma.  Mother deceased 11-06-17 with history of respiratory problems.  Sister in good health.  Social history: He is married.  1 adopted daughter who is married resides in the [place] area.  Wife is a retired Art therapist in the [employer] school system but still works some.  Patient does not smoke or consume alcohol.  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  He serves as a [employer].  Has had influenza vaccine October 1 of this year.  Tetanus immunization is up-to-date.   Review of Systems see above regarding scrotal rash.  Otherwise no new complaints     Objective:    Physical Exam Blood pressure 130/70 pulse 70 pulse oximetry 97% weight 272 pounds BMI 35.8 and height 6 feet 1 inches  Skin warm and dry.  No cervical adenopathy.  TMs are clear.  Neck is supple without JVD thyromegaly or carotid bruits.  Chest is clear to auscultation without rales or wheezing.  Cardiac exam regular rate and rhythm normal S1 and S2 without murmurs or gallops.  Abdomen soft nondistended without hepatosplenomegaly masses or tenderness.  Prostate is normal without masses.  He has erythematous rash involving right testicle and right groin area consistent with Candida infection.  No lower extremity edema.  Neuro intact without focal deficits.  Affect felt judgment are normal.       Assessment & Plan:  Tinea cruris-treat with Mycolog cream twice daily until healed and then use as necessary.  Type 2 diabetes mellitus.  Did not tolerate Metformin because of diarrhea.  Hemoglobin A1c is increased on glipizide.  We will try Januvia 100 mg daily.  He will return in 6 weeks for follow-up.  Hyperlipidemia-triglycerides are elevated.  Increase statin medication to 80 mg daily and follow-up in 6 weeks with lipid panel and liver functions.  Essential hypertension-stable on current regimen  Elevated BMI 35.89-continue to encourage diet exercise and weight loss  Plan: See above and follow-up in 6 weeks.

## 2020-08-24 ENCOUNTER — Encounter: Payer: Managed Care, Other (non HMO) | Admitting: Internal Medicine

## 2020-09-15 ENCOUNTER — Other Ambulatory Visit: Payer: Self-pay | Admitting: Internal Medicine

## 2020-10-05 ENCOUNTER — Other Ambulatory Visit: Payer: Self-pay

## 2020-10-05 ENCOUNTER — Other Ambulatory Visit: Payer: Managed Care, Other (non HMO) | Admitting: Internal Medicine

## 2020-10-05 DIAGNOSIS — I10 Essential (primary) hypertension: Secondary | ICD-10-CM

## 2020-10-05 DIAGNOSIS — E781 Pure hyperglyceridemia: Secondary | ICD-10-CM

## 2020-10-05 DIAGNOSIS — R7302 Impaired glucose tolerance (oral): Secondary | ICD-10-CM

## 2020-10-06 ENCOUNTER — Ambulatory Visit: Payer: Managed Care, Other (non HMO) | Admitting: Internal Medicine

## 2020-10-06 ENCOUNTER — Encounter: Payer: Self-pay | Admitting: Internal Medicine

## 2020-10-06 VITALS — BP 120/70 | HR 68 | Ht 73.0 in | Wt 271.0 lb

## 2020-10-06 DIAGNOSIS — E119 Type 2 diabetes mellitus without complications: Secondary | ICD-10-CM

## 2020-10-06 DIAGNOSIS — E781 Pure hyperglyceridemia: Secondary | ICD-10-CM

## 2020-10-06 DIAGNOSIS — I10 Essential (primary) hypertension: Secondary | ICD-10-CM | POA: Diagnosis not present

## 2020-10-06 LAB — HEPATIC FUNCTION PANEL
AG Ratio: 2 (calc) (ref 1.0–2.5)
ALT: 45 U/L (ref 9–46)
AST: 19 U/L (ref 10–35)
Albumin: 4.4 g/dL (ref 3.6–5.1)
Alkaline phosphatase (APISO): 27 U/L — ABNORMAL LOW (ref 35–144)
Bilirubin, Direct: 0.3 mg/dL — ABNORMAL HIGH (ref 0.0–0.2)
Globulin: 2.2 g/dL (calc) (ref 1.9–3.7)
Indirect Bilirubin: 1.1 mg/dL (calc) (ref 0.2–1.2)
Total Bilirubin: 1.4 mg/dL — ABNORMAL HIGH (ref 0.2–1.2)
Total Protein: 6.6 g/dL (ref 6.1–8.1)

## 2020-10-06 LAB — LIPID PANEL
Cholesterol: 97 mg/dL (ref <200)
HDL: 23 mg/dL — ABNORMAL LOW (ref >=40)
LDL Cholesterol (Calc): 44 mg/dL (calc)
Non-HDL Cholesterol (Calc): 74 mg/dL (calc) (ref <130)
Total CHOL/HDL Ratio: 4.2 (calc) (ref <5.0)
Triglycerides: 257 mg/dL — ABNORMAL HIGH (ref <150)

## 2020-10-06 LAB — HEMOGLOBIN A1C
Hgb A1c MFr Bld: 6.9 % of total Hgb — ABNORMAL HIGH (ref <5.7)
Mean Plasma Glucose: 151 mg/dL
eAG (mmol/L): 8.4 mmol/L

## 2020-10-06 NOTE — Progress Notes (Signed)
   Subjective:    Patient ID: Lance Beard, male    DOB: 10/13/1960, 60 y.o.   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 Has not been able to exercise as much. Has been tied up with [employer] and new [employer] in [place] which unfortunately involves a new highway interchange that may be coming very close to his home and property which is worrisome. He is also running for re-election this Spring.  History of impaired glucose tolerance, hypertension and hypertriglyceridemia.  He previously was on glipizide and now I have him on Januvia 100 mg daily.  Metformin caused diarrhea.  His hemoglobin A1c has increased from 6.7% in December to 6.9%.  We can certainly add the glipizide back.  Metformin does not seem to be an option.  He may be a candidate for another medication.  I would like for him to see Dr. Dwyane Dee with regard to this.  Also, triglycerides are 257 and were 238 in December.  He has a low HDL of 23.  Total cholesterol is 97 and LDL cholesterol is 44.  Currently on Lipitor 80 mg daily which was increased at last visit from 40 mg.  With regard to hypertension, his blood pressure is stable on current regimen of atenolol 25 mg daily  He had cardiac calcium scoring in July 2021.  Score was 78.2.  Patient had mild calcified plaque in proximal LAD causing minimal stenosis.  He has a family history of coronary disease in his father.  Cardiologist evaluated him for exertional chest pain in early July 2021.  Noted some typical and atypical features.  Agreed with medication regimen.  He is obese.  Weight is 271 pounds and BMI is 35.75.    Review of Systems no new complaints     Objective:   Physical Exam Blood pressure 120/70, pulse 68 regular pulse oximetry 98% weight 271 pounds BMI 35.75  Skin warm and dry.  No cervical adenopathy or carotid bruits.  Chest  clear to auscultation.  Cardiac exam regular rate and rhythm normal S1 and S2.  Trace lower extremity edema nonpitting.       Assessment & Plan:  Type 2 diabetes mellitus with hemoglobin A1c 6.9%-I would like for him to see Dr. Dwyane Dee with regard to what medication to add to [facility].  Hypertriglyceridemia-currently on Lipitor 80 mg daily.  Family history of coronary artery disease.  Despite increasing Lipitor from 40 to 80 mg daily, triglycerides are still elevated.  Previously were 238 in December and are down to 57.  He has a low HDL of 23 and normal total cholesterol and LDL cholesterol.-Would like Dr. Dwyane Dee to weigh in on proper management.  Obesity-needs to diet exercise and lose weight.  Main issue seems to be not exercising due to lack of time and weather issues  Plan: He is being referred to Dr. Dwyane Dee, Endocrinologist for consultation: Treatment of type 2 diabetes mellitus and hypertriglyceridemia.  He is agreeable to this.

## 2020-10-06 NOTE — Patient Instructions (Addendum)
Patient will see Dr. Dwyane Dee for consultation.  His next appointment here is scheduled for July.  Encourage diet exercise and weight loss.

## 2020-10-12 ENCOUNTER — Ambulatory Visit: Payer: Managed Care, Other (non HMO) | Admitting: Endocrinology

## 2020-10-17 ENCOUNTER — Other Ambulatory Visit: Payer: Self-pay

## 2020-10-17 MED ORDER — SITAGLIPTIN PHOSPHATE 100 MG PO TABS: 100.0000 mg | ORAL_TABLET | Freq: Every day | ORAL | 0 refills | Status: DC

## 2020-10-26 ENCOUNTER — Ambulatory Visit: Payer: Managed Care, Other (non HMO) | Admitting: Endocrinology

## 2020-10-26 ENCOUNTER — Other Ambulatory Visit: Payer: Self-pay

## 2020-10-26 ENCOUNTER — Telehealth: Payer: Self-pay | Admitting: Internal Medicine

## 2020-10-26 ENCOUNTER — Encounter: Payer: Self-pay | Admitting: Endocrinology

## 2020-10-26 VITALS — BP 186/90 | HR 94 | Ht 75.0 in | Wt 274.0 lb

## 2020-10-26 DIAGNOSIS — E1165 Type 2 diabetes mellitus with hyperglycemia: Secondary | ICD-10-CM

## 2020-10-26 DIAGNOSIS — E782 Mixed hyperlipidemia: Secondary | ICD-10-CM | POA: Diagnosis not present

## 2020-10-26 MED ORDER — FENOFIBRATE 145 MG PO TABS: 145.0000 mg | ORAL_TABLET | Freq: Every day | ORAL | 2 refills | Status: DC

## 2020-10-26 MED ORDER — RYBELSUS 7 MG PO TABS: 1.0000 | ORAL_TABLET | Freq: Every day | ORAL | 3 refills | Status: DC

## 2020-10-26 NOTE — Telephone Encounter (Signed)
Called patient and let him know what Dr Renold Genta said he verbalized understanding and will call us tomorrow with BP readings.

## 2020-10-26 NOTE — Patient Instructions (Addendum)
Check blood sugars on waking up 3 days a week  Also check blood sugars about 2 hours after meals and do this after different meals by rotation  Recommended blood sugar levels on waking up are 90-130 and about 2 hours after meal is 130-160  Please bring your blood sugar monitor to each visit, thank you  Rybelsus improves blood sugar control as well as can help with weight loss and reduces cardiovascular events. Need to take the capsules on empty stomach 30 minutes before breakfast with 4 ounces of water daily.   You may feel more fullness at mealtimes and try to keep portions at meals smaller Some people may have nausea or even vomiting that may occur in the first few days; usually the symptoms go away with time. Please call if nausea or vomiting does not improve within 2 weeks  Stop Januvia when out and Lipitor  Walk daily

## 2020-10-26 NOTE — Telephone Encounter (Signed)
It was fine earlier this month when we checked it. Have him check it tonight  after he gets home and rests some-and call us tomorrow with a couple of readings

## 2020-10-26 NOTE — Telephone Encounter (Addendum)
Lance Beard  [phone]  [name] was at his appointment with Dr Dwyane Dee and his blood pressure was 186/90, they were concerned and had him call here before he left there office. Every time they took it keep increasing, he feels okay.

## 2020-10-26 NOTE — Progress Notes (Signed)
Patient ID: Lance Beard, male   DOB: 1961-02-24, 60 y.o.   MRN: [id number]           Referring physician: Tedra Senegal  Chief complaint: Management of diabetes and high triglycerides  History of Present Illness:    HYPERLIPIDEMIA:  He probably was found to have hypertriglyceridemia around the year 2010 Initially was tried on TriCor but he felt that it was causing excessive sweating and he did not take it for long He was subsequently switched to atorvastatin which he has continued On review of his lipids as far back as 2012 indicate that he has not had hypercholesterolemia but mostly high triglyceride levels with low HDL He has not tried any other medications for lipids More recently his triglycerides appear to be gradually increasing  Lab Results  Component Value Date   CHOL 97 10/05/2020   CHOL 105 08/17/2020   CHOL 94 02/18/2020   Lab Results  Component Value Date   HDL 23 (L) 10/05/2020   HDL 28 (L) 08/17/2020   HDL 25 (L) 02/18/2020   Lab Results  Component Value Date   LDLCALC 44 10/05/2020   LDLCALC 47 08/17/2020   LDLCALC 41 02/18/2020   Lab Results  Component Value Date   TRIG 257 (H) 10/05/2020   TRIG 238 (H) 08/17/2020   TRIG 215 (H) 02/18/2020   Lab Results  Component Value Date   CHOLHDL 4.2 10/05/2020   CHOLHDL 3.8 08/17/2020   CHOLHDL 3.8 02/18/2020   No results found for: LDLDIRECT  Problem 2: DIABETES  Date of diagnosis of type 2 diabetes mellitus: 2020       Background history:   He had prediabetes with A1c 6% in 2019 which was unchanged until 07/2020 when it went up to 6.7. He apparently was prescribed glipizide ER 2.5 mg daily in 05/2019 which was continued until probably 2/22 He tried Metformin in 6/20 but may not have tolerated this because of diarrhea  Recent history:   Most recent A1c is 6.9 done on 10/05/2020   Non-insulin hypoglycemic drugs the patient is taking are: Januvia 100 mg daily  Current management, blood sugar  patterns and problems identified:  Since his A1c had gone up in 12/21 he was given Januvia 100 mg daily  He was also started on home glucose monitoring at that time   Since his A1c was still relatively high at 6.9 in February he has been referred here  He still has relatively high fasting blood sugars averaging about 158 and as high as 209  He is not consistently planning his meals although trying to eat more salads.  Usually eating out 3 to 4 days a week, mealtimes are irregular  Because of his busy routine he is not finding the time to exercise  Has had difficulty losing weight        Side effects from medications have been: Possible GI intolerance to Metformin      Typical meal intake: Breakfast is usually a bagel               Exercise: Recently none    Glucose monitoring:  done <1 times a day         Glucometer: One Touch .       Blood Glucose readings by time of day and averages from meter download:  PREMEAL Breakfast Lunch Dinner Bedtime  Overall   Glucose range:  136-209   94-127    Median:  158   111  145   POST-MEAL PC Breakfast PC Lunch PC Dinner  Glucose range:    106, 201  Median:       Dietician visit, most recent: Years ago  Wt Readings from Last 3 Encounters:  10/26/20 274 lb (124.3 kg)  10/06/20 271 lb (122.9 kg)  08/23/20 272 lb (123.4 kg)     Lab Results  Component Value Date   HGBA1C 6.9 (H) 10/05/2020   HGBA1C 6.7 (H) 08/17/2020   HGBA1C 6.0 (H) 02/18/2020   Lab Results  Component Value Date   MICROALBUR 0.7 08/17/2020   LDLCALC 44 10/05/2020   CREATININE 1.06 08/17/2020    Past Medical History:  Diagnosis Date  . Chronic kidney disease    kidney stones x2  . Hx of cardiovascular stress test    a. ETT-MV 3/14:  No scar or ischemia, EF 62%  . Hyperlipidemia   . Hypertension   . PONV (postoperative nausea and vomiting)    with one surgery  . Sleep apnea    CPAP    Past Surgical History:  Procedure Laterality Date  .  APPENDECTOMY    . COLONOSCOPY    . NASAL SEPTUM SURGERY    . VARICOCELE EXCISION      Family History  Problem Relation Age of Onset  . Diabetes Father   . Hypertension Father   . CAD Father        Approximate age 33   . Lung cancer Father   . Colon cancer Neg Hx   . Esophageal cancer Neg Hx   . Stomach cancer Neg Hx   . Rectal cancer Neg Hx     Social History:  reports that he has never smoked. He has never used smokeless tobacco. He reports that he does not drink alcohol and does not use drugs.  Allergies: No Known Allergies  Allergies as of 10/26/2020   No Known Allergies     Medication List       Accurate as of October 26, 2020  2:31 PM. If you have any questions, ask your nurse or doctor.        aspirin 81 MG tablet Take 81 mg by mouth daily.   atenolol 25 MG tablet Commonly known as: TENORMIN TAKE 1 TABLET BY MOUTH EVERY DAY   atorvastatin 80 MG tablet Commonly known as: LIPITOR Take 1 tablet (80 mg total) by mouth daily.   B-complex with vitamin C tablet Take 1 tablet by mouth daily.   nitroGLYCERIN 0.4 MG SL tablet Commonly known as: NITROSTAT Place 1 tablet (0.4 mg total) under the tongue every 5 (five) minutes as needed for chest pain.   nystatin-triamcinolone cream Commonly known as: MYCOLOG II Apply to scrotum twice a day.   OneTouch Ultra test strip Generic drug: glucose blood TAEST BLOOD SUGAR AS DIRECTED   sitaGLIPtin 100 MG tablet Commonly known as: Januvia Take 1 tablet (100 mg total) by mouth daily.       LABS:  No visits with results within 1 Week(s) from this visit.  Latest known visit with results is:  Lab on 10/05/2020  Component Date Value Ref Range Status  . Cholesterol 10/05/2020 97  <200 mg/dL Final  . HDL 10/05/2020 23* > OR = 40 mg/dL Final  . Triglycerides 10/05/2020 257* <150 mg/dL Final   Comment: . If a non-fasting specimen was collected, consider repeat triglyceride testing on a fasting specimen if clinically  indicated.  Yates Decamp et al. J. of Clin. Lipidol. 7591;6:384-665. Marland Kitchen   Marland Kitchen  LDL Cholesterol (Calc) 10/05/2020 44  mg/dL (calc) Final   Comment: Reference range: <100 . Desirable range <100 mg/dL for primary prevention;   <70 mg/dL for patients with CHD or diabetic patients  with > or = 2 CHD risk factors. Marland Kitchen LDL-C is now calculated using the Martin-Hopkins  calculation, which is a validated novel method providing  better accuracy than the Friedewald equation in the  estimation of LDL-C.  Cresenciano Genre et al. Annamaria Helling. 2119;417(40): 2061-2068  (http://education.[facility].com/faq/FAQ164)   . Total CHOL/HDL Ratio 10/05/2020 4.2  <5.0 (calc) Final  . Non-HDL Cholesterol (Calc) 10/05/2020 74  <130 mg/dL (calc) Final   Comment: For patients with diabetes plus 1 major ASCVD risk  factor, treating to a non-HDL-C goal of <100 mg/dL  (LDL-C of <70 mg/dL) is considered a therapeutic  option.   . Total Protein 10/05/2020 6.6  6.1 - 8.1 g/dL Final  . Albumin 10/05/2020 4.4  3.6 - 5.1 g/dL Final  . Globulin 10/05/2020 2.2  1.9 - 3.7 g/dL (calc) Final  . AG Ratio 10/05/2020 2.0  1.0 - 2.5 (calc) Final  . Total Bilirubin 10/05/2020 1.4* 0.2 - 1.2 mg/dL Final  . Bilirubin, Direct 10/05/2020 0.3* 0.0 - 0.2 mg/dL Final  . Indirect Bilirubin 10/05/2020 1.1  0.2 - 1.2 mg/dL (calc) Final  . Alkaline phosphatase (APISO) 10/05/2020 27* 35 - 144 U/L Final  . AST 10/05/2020 19  10 - 35 U/L Final  . ALT 10/05/2020 45  9 - 46 U/L Final  . Hgb A1c MFr Bld 10/05/2020 6.9* <5.7 % of total Hgb Final   Comment: For someone without known diabetes, a hemoglobin A1c value of 6.5% or greater indicates that they may have  diabetes and this should be confirmed with a follow-up  test. . For someone with known diabetes, a value <7% indicates  that their diabetes is well controlled and a value  greater than or equal to 7% indicates suboptimal  control. A1c targets should be individualized based on  duration of diabetes,  age, comorbid conditions, and  other considerations. . Currently, no consensus exists regarding use of hemoglobin A1c for diagnosis of diabetes for children. .   . Mean Plasma Glucose 10/05/2020 151  mg/dL Final  . eAG (mmol/L) 10/05/2020 8.4  mmol/L Final        Review of Systems  Constitutional: Negative for weight loss and weight gain.  Eyes: Negative for blurred vision.  Respiratory: Negative for shortness of breath.   Cardiovascular: Negative for chest pain.  Gastrointestinal: Negative for vomiting.       Previously had symptoms of reflux  Endocrine: Negative for fatigue.  Genitourinary: Negative for frequency.  Musculoskeletal: Negative for muscle aches.  Neurological: Positive for tingling. Negative for numbness.       He has a cramp-like sensation on the ball of his feet at times and only mild tingling   Last eye exam was 06/2020 with no retinopathy  BP Readings from Last 3 Encounters:  10/26/20 (!) 182/102  10/06/20 120/70  08/23/20 130/70     PHYSICAL EXAM:  BP (!) 182/102   Pulse 94   Ht 6\' 3"  (1.905 m)   Wt 274 lb (124.3 kg)   SpO2 97%   BMI 34.25 kg/m   GENERAL: Generalized abdominal obesity present  No pallor, clubbing, lymphadenopathy or edema.  Skin:  no rash or pigmentation.  EYES:  Externally normal.  Fundii: Exam deferred.  ENT: Oral mucosa and tongue normal.  THYROID:  Not palpable.  HEART:  Normal  S1 and S2; no murmur or click.  CHEST:  Normal shape.  Lungs: Vescicular breath sounds heard equally.  No crepitations/ wheeze.  ABDOMEN:  No distention.  Liver and spleen not palpable.  No other mass or tenderness.  NEUROLOGICAL:   Ankle jerks: Present on the left side and decreased on the right  Vibration sense moderately reduced in the distal toes  Diabetic Foot Exam - Simple   Simple Foot Form Diabetic Foot exam was performed with the following findings: Yes   Visual Inspection No deformities, no ulcerations, no other skin  breakdown bilaterally: Yes Sensation Testing Intact to touch and monofilament testing bilaterally: Yes Pulse Check Posterior Tibialis and Dorsalis pulse intact bilaterally: Yes Comments      JOINTS:  Normal peripheral joints.   ASSESSMENT:    DIABETES with obesity: Currently managed with Januvia only.  Although his A1c is only 6.9 he has blood sugars as high as 209 at home even with taking Januvia.   Also has difficulty losing weight and likely can do better with meal planning with cutting back on higher calorie foods, balancing meals with more protein and better choices when eating out Currently not in an exercise regimen  No evidence of diabetic complications although on exam he has decreased vibration sense and has possibly mild symptoms of peripheral neuropathy in his feet    HYPERLIPIDEMIA this is primarily hypertriglyceridemia and no history of increased LDL. He thinks he was taken off TriCor because of increased sweating but this seems to be unlikely side effect and will have better control of his lipids with fenofibrate compared to atorvastatin as triglycerides are still around 250 now  ?  HYPERTENSION: He appears to have significant whitecoat syndrome today and his blood pressure usually is fairly good on low-dose atenolol only   PLAN:    Switch Januvia to Rybelsus Discussed with the patient the nature of GLP-1 drugs, the actions on insulin secretion, slowing stomach emptying, reduction of appetite and reduced liver glucose production Explained that Rybelsus improves blood sugar control as well as produces weight loss and reduces cardiovascular events. Explained possible side effects especially nausea and vomiting that may occur in the first few days; usually side effects improve with time.  Patient to call if nausea or vomiting does not improve within 2 weeks Instructed to take the medication on empty stomach 30 minutes before breakfast with 4 ounces of water daily.   Patient education material given  He will take 3 mg samples for 30 days and then go up to 7 mg if well tolerated  Consultation with dietitian for meal planning  Start regular walking for exercise  Stop Januvia when finished  To check blood sugars either on waking up or 2 hours after one of his meals  Change ATORVASTATIN to FENOFIBRATE 145 mg  Given him list of high saturated fat foods to avoid  Also if triglycerides are consistently high consider adding pioglitazone or fish oil  Follow-up in about 6 weeks with fasting labs   Consultation note sent to the referring physician  Elayne Snare 10/26/2020, 2:31 PM

## 2020-10-27 ENCOUNTER — Encounter: Payer: Self-pay | Admitting: Internal Medicine

## 2020-10-27 ENCOUNTER — Ambulatory Visit: Payer: Managed Care, Other (non HMO) | Admitting: Internal Medicine

## 2020-10-27 VITALS — BP 120/70 | HR 66 | Ht 73.0 in | Wt 272.0 lb

## 2020-10-27 DIAGNOSIS — I10 Essential (primary) hypertension: Secondary | ICD-10-CM

## 2020-10-27 DIAGNOSIS — E781 Pure hyperglyceridemia: Secondary | ICD-10-CM | POA: Diagnosis not present

## 2020-10-27 DIAGNOSIS — E119 Type 2 diabetes mellitus without complications: Secondary | ICD-10-CM | POA: Diagnosis not present

## 2020-10-27 MED ORDER — ALPRAZOLAM 0.25 MG PO TABS
0.2500 mg | ORAL_TABLET | Freq: Two times a day (BID) | ORAL | 1 refills | Status: DC | PRN
Start: 2020-10-27 — End: 2021-07-30

## 2020-10-27 MED ORDER — LOSARTAN POTASSIUM 50 MG PO TABS
50.0000 mg | ORAL_TABLET | Freq: Every day | ORAL | 0 refills | Status: DC
Start: 2020-10-27 — End: 2020-11-07

## 2020-10-27 NOTE — Telephone Encounter (Signed)
Lance Beard called this morning with blood pressure readings 10/26/2020 5:30 PM    174/76 7:00 PM  179/76 10:15 PM  153/77  10/27/2020 5:45AM  181/79 Before medication Took medication at 6:15 am 7:00 AM  167/76 9:00 AM  165/76

## 2020-10-27 NOTE — Progress Notes (Signed)
   Subjective:    Patient ID: Lance Beard, male    DOB: 1961/06/18, 60 y.o.   MRN: [id number]  HPI 60 year old seen for follow-up on hypertension.  His blood pressure is great today at 120/70.  Yesterday his blood pressure was high at Dr. Ronnie Derby office.  He was likely anxious.  It was 186/90.  Dr. Dwyane Dee counseled him regarding cutting back on high calorie foods and doing better with meal planning.  Also encouraged him to exercise.  Dr. Dwyane Dee discontinued Januvia and change patient to Rybelsus.  He changed generic Lipitor to fenofibrate 145 mg daily.  Patient is to follow-up with him in 6 weeks.  Continued situational stress discussed.  Review of Systems see above     Objective:   Physical Exam His blood pressure today is excellent at 120/70.  Weight 272 pounds.  However blood pressure has not been  this low over the past several days.  We are going to increase losartan to 100 mg daily and increase Tenormin from 25 to 50 mg daily.       Assessment & Plan:  Essential HTN-increase losartan to 100 mg daily and increase Tenormin to 50 mg daily.  Type 2 diabetes mellitus-Dr. Dwyane Dee discontinued Januvia and put patient on Rybelsus and he is to follow-up with him in 6 weeks.  Hyperlipidemia-Lipitor discontinued and patient placed on fenofibrate 140 mg daily by Dr. Dwyane Dee  Plan: Patient is to call with blood pressure readings.

## 2020-10-27 NOTE — Telephone Encounter (Signed)
I need to see him today

## 2020-10-27 NOTE — Telephone Encounter (Signed)
LVM to CB and schedule an appointment for today

## 2020-10-30 NOTE — Telephone Encounter (Signed)
Please call him and ask him to take 2 tabs of losartan . Hehas 50 mg tabs so dose would be 100mg  daily and follow up here on Friday if his schedule permits.

## 2020-10-30 NOTE — Telephone Encounter (Signed)
Lance Beard called with updated blood pressure readings Saturday 8:30 AM   157/72 11:00 PM  148/71  Sunday 8:30 AM  145/72 8:00 PM  145/78  Monday 6:30 AM  152/71

## 2020-10-30 NOTE — Telephone Encounter (Signed)
Patient was scheduled and told to keep a record of his BP readings, he agreed to take 2 tabs of losartan.

## 2020-11-03 ENCOUNTER — Ambulatory Visit: Payer: Managed Care, Other (non HMO) | Admitting: Internal Medicine

## 2020-11-03 ENCOUNTER — Encounter: Payer: Self-pay | Admitting: Internal Medicine

## 2020-11-03 ENCOUNTER — Other Ambulatory Visit: Payer: Self-pay

## 2020-11-03 VITALS — BP 130/70 | HR 78 | Ht 73.0 in | Wt 272.0 lb

## 2020-11-03 DIAGNOSIS — I10 Essential (primary) hypertension: Secondary | ICD-10-CM

## 2020-11-03 DIAGNOSIS — F439 Reaction to severe stress, unspecified: Secondary | ICD-10-CM

## 2020-11-03 MED ORDER — HYDROCHLOROTHIAZIDE 25 MG PO TABS: 25.0000 mg | ORAL_TABLET | Freq: Every day | ORAL | 1 refills | Status: DC

## 2020-11-03 NOTE — Progress Notes (Signed)
   Subjective:    Patient ID: Lance Beard, male    DOB: 03/12/1961, 60 y.o.   MRN: [id number]  HPI  Now on Losartan 100 mg daily since March 4rd and Tenormin 25 mg daily. Has had busy week with one difficult meeting. Blood pressure readings still remain elevated.  No headache.        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     Objective:   Physical Exam  BP 130/70, pulse 78, Pulse ox 97%, Weight 272 pounds  Chest is clear.  Cardiac exam regular rate and rhythm.  No lower extremity edema.      Assessment & Plan:  Essential HTN  Plan: Increase Tenormin from 25 to 50 mg daily and continue losartan 100 mg daily.  Continue HCTZ 25 mg daily.

## 2020-11-07 ENCOUNTER — Telehealth: Payer: Self-pay | Admitting: Internal Medicine

## 2020-11-07 DIAGNOSIS — I10 Essential (primary) hypertension: Secondary | ICD-10-CM

## 2020-11-07 MED ORDER — OLMESARTAN MEDOXOMIL 20 MG PO TABS: 20.0000 mg | ORAL_TABLET | Freq: Every day | ORAL | 0 refills | Status: DC

## 2020-11-07 MED ORDER — ATENOLOL 50 MG PO TABS: 50.0000 mg | ORAL_TABLET | Freq: Every day | ORAL | 0 refills | Status: DC

## 2020-11-07 NOTE — Telephone Encounter (Signed)
I called patient. He is taking 100 mg losartan, HCTZ 25 mg daily and Tenormin 50 mg daily. BP still fluctuating. Pulse is OK. Change losartan to Olmesartan 20 mg daily and call me with readings on Friday.

## 2020-11-07 NOTE — Telephone Encounter (Signed)
Mayan faxed his Blood Pressure readings  11/03/2020  150/68  74 10:55 PM 11/04/2020  161/79   8:35 AM 11/04/2020  147/71  60 8:45 PM 11/05/2020  131/67  71 7:30 AM 11/05/2020  132/63  56 8:45 PM 3/14?2022  139/72  84 6:45 AM 3/14?2022  131/73  62 7:45 PM 3?15?2022  148/71  85 6:45 AM

## 2020-11-07 NOTE — Telephone Encounter (Signed)
Patient now taking Tenormin 50 mg daily instead of 25 mg daily.Discontinuing losartan and adding olmesartan 20 mg daily. Continue HCTZ 25 mg daily. Call me with BP readings on Friday.  Recent BP Readings were better on Sunday with less activity.

## 2020-11-10 NOTE — Telephone Encounter (Signed)
Scheduled appt., let him know to bring meds

## 2020-11-10 NOTE — Telephone Encounter (Signed)
These are very good. We will see him the first week of April for follow up and B-met on new med. Does not need to fast. Brings meds with him.

## 2020-11-10 NOTE — Telephone Encounter (Signed)
Lance Beard faxed his blood pressure readings.  11/07/2020  137/68  65 8:15 PM Patient started taking new medication in am on 11/08/2020 Walked before taking BP 11/08/2020  132/66  90 7:15 AM 11/08/2020  128/66  71 9:45 PM 11/09/2020  130/61  79 6:15 AM 11/09/2020  129/68  71 9:00 PM Walked before taking BP 11/10/2020  128/69  90 7:00 AM

## 2020-11-18 ENCOUNTER — Other Ambulatory Visit: Payer: Self-pay

## 2020-11-18 MED ORDER — ATENOLOL 50 MG PO TABS: 50.0000 mg | ORAL_TABLET | Freq: Every day | ORAL | 0 refills | Status: DC

## 2020-11-19 ENCOUNTER — Other Ambulatory Visit: Payer: Self-pay | Admitting: Internal Medicine

## 2020-11-23 NOTE — Patient Instructions (Signed)
Increase losartan to 100 mg daily.  Increase Tenormin to 50 mg daily.  Now on Rybelsus instead of Januvia.  Now on fenofibrate instead of Lipitor.  Call with blood pressure readings.

## 2020-11-23 NOTE — Patient Instructions (Signed)
Increase Tenormin from 25 to 50 mg daily and continue losartan 100 mg daily.  Continue HCTZ 25 mg daily.  Continue to check blood pressure at least daily.

## 2020-11-28 ENCOUNTER — Ambulatory Visit: Payer: Managed Care, Other (non HMO) | Admitting: Internal Medicine

## 2020-11-28 ENCOUNTER — Encounter: Payer: Self-pay | Admitting: Internal Medicine

## 2020-11-28 ENCOUNTER — Other Ambulatory Visit: Payer: Self-pay

## 2020-11-28 VITALS — BP 110/60 | HR 64 | Ht 73.0 in | Wt 266.0 lb

## 2020-11-28 DIAGNOSIS — I10 Essential (primary) hypertension: Secondary | ICD-10-CM | POA: Diagnosis not present

## 2020-11-28 DIAGNOSIS — E119 Type 2 diabetes mellitus without complications: Secondary | ICD-10-CM

## 2020-11-28 MED ORDER — ONETOUCH ULTRA VI STRP: ORAL_STRIP | 99 refills | Status: DC

## 2020-11-28 MED ORDER — ONETOUCH DELICA LANCETS 33G MISC: 99 refills | Status: DC

## 2020-11-28 NOTE — Patient Instructions (Signed)
Decrease Tenormin from 50 mg daily to 25 mg daily. Continue other meds as prescribed. RTC late May for follow up.

## 2020-11-28 NOTE — Progress Notes (Signed)
   Subjective:    Patient ID: Lance Beard, male    DOB: 10/15/1960, 60 y.o.   MRN: [id number]  HPI 60 year old Male seen for for follow-up on hypertension.  At times his blood pressure has been a bit low.  He saw Dr. Dwyane Dee yesterday regarding follow-up on hyperlipidemia and prediabetes.  Rybelsus was increased to 14 mg from 7 mg.  Patient will continue with fenofibrate and consideration will be given to adding Vascepa.  His creatinine had increased from 1.06 when checked 4 months ago to 1.50.  We discussed Dr. Ronnie Derby recommendation of decreasing HCTZ.   We decided to decrease Tenormin from 50 to 25 mg daily.  His labs were fasting when his creatinine was noted to be 1.50.  He had not had significant fluid intake at that time.  We will repeat that lab on his return visit in May.  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  He has situational stress with his property that has been previously discussed.    Review of Systems see above     Objective:   Physical Exam  Blood pressure is excellent 110/60 pulse 64 pulse oximetry 98% weight 266 pounds BMI 35.09.  Has lost 6 pounds since last visit.      Assessment & Plan:  Essential hypertension-improved  Elevated serum creatinine on recent labs by Dr. Dwyane Dee.  Continue to monitor.  Could be he was mildly dehydrated from his labs were drawn.  We did give consideration to increasing HCTZ but we have continued it for the present time follow-up at next visit anxiety.  We will increase Tenormin from 50 mg to 25 mg daily and monitor his blood pressure carefully and call me if any questions.

## 2020-11-30 ENCOUNTER — Other Ambulatory Visit: Payer: Self-pay | Admitting: Internal Medicine

## 2020-12-04 ENCOUNTER — Other Ambulatory Visit: Payer: Self-pay | Admitting: Internal Medicine

## 2020-12-07 ENCOUNTER — Other Ambulatory Visit (INDEPENDENT_AMBULATORY_CARE_PROVIDER_SITE_OTHER): Payer: Managed Care, Other (non HMO)

## 2020-12-07 ENCOUNTER — Encounter: Payer: Self-pay | Admitting: Dietician

## 2020-12-07 ENCOUNTER — Encounter: Payer: Managed Care, Other (non HMO) | Attending: Endocrinology | Admitting: Dietician

## 2020-12-07 ENCOUNTER — Other Ambulatory Visit: Payer: Self-pay

## 2020-12-07 DIAGNOSIS — E782 Mixed hyperlipidemia: Secondary | ICD-10-CM

## 2020-12-07 DIAGNOSIS — E119 Type 2 diabetes mellitus without complications: Secondary | ICD-10-CM

## 2020-12-07 DIAGNOSIS — E1165 Type 2 diabetes mellitus with hyperglycemia: Secondary | ICD-10-CM

## 2020-12-07 LAB — COMPREHENSIVE METABOLIC PANEL
ALT: 51 U/L (ref 0–53)
AST: 24 U/L (ref 0–37)
Albumin: 4.2 g/dL (ref 3.5–5.2)
Alkaline Phosphatase: 15 U/L — ABNORMAL LOW (ref 39–117)
BUN: 28 mg/dL — ABNORMAL HIGH (ref 6–23)
CO2: 29 mEq/L (ref 19–32)
Calcium: 9.5 mg/dL (ref 8.4–10.5)
Chloride: 102 mEq/L (ref 96–112)
Creatinine, Ser: 1.5 mg/dL (ref 0.40–1.50)
GFR: 50.81 mL/min — ABNORMAL LOW (ref >60.00)
Glucose, Bld: 120 mg/dL — ABNORMAL HIGH (ref 70–99)
Potassium: 4.3 mEq/L (ref 3.5–5.1)
Sodium: 138 mEq/L (ref 135–145)
Total Bilirubin: 0.8 mg/dL (ref 0.2–1.2)
Total Protein: 7.1 g/dL (ref 6.0–8.3)

## 2020-12-07 LAB — HEMOGLOBIN A1C: Hgb A1c MFr Bld: 7.1 % — ABNORMAL HIGH (ref 4.6–6.5)

## 2020-12-07 LAB — LDL CHOLESTEROL, DIRECT: Direct LDL: 80 mg/dL

## 2020-12-07 LAB — LIPID PANEL
Cholesterol: 132 mg/dL (ref 0–200)
HDL: 26.7 mg/dL — ABNORMAL LOW (ref >39.00)
NonHDL: 105.47
Total CHOL/HDL Ratio: 5
Triglycerides: 212 mg/dL — ABNORMAL HIGH (ref 0.0–149.0)
VLDL: 42.4 mg/dL — ABNORMAL HIGH (ref 0.0–40.0)

## 2020-12-07 NOTE — Patient Instructions (Signed)
Increase your water intake Decrease saturated fat Consider adding good sources of Omega 3 such as Salmon, ground flax seeds, chia seeds or walnuts.     Variety of non starchy vegetables. Balance breakfast (eggs, fruit, 1 slice [employer] toast OR whole wheat English muffin and fruit OR oatmeal, berries, walnuts).  Continue legumes.  These can be added to a salad.  Continue to stay active.  Aim for at least 30 minutes most days.  Options for an earlier dinner?

## 2020-12-07 NOTE — Progress Notes (Signed)
Diabetes Self-Management Education  Visit Type: First/Initial  Appt. Start Time: 0910 Appt. End Time: 1020  12/07/2020  [name], identified by name and date of birth, is a 60 y.o. male with a diagnosis of Diabetes: Type 2.   ASSESSMENT Patient is here today alone.  He was referred for Type 2 Diabetes and mixed hyperlipidemia. He states that he is concerned about his fasting blood sugar.  Weight hx: 266 lbs 12/07/2020 274 lbs 10/26/2020 260-270 lbs UBW Lost weight about 2018 with exercise and watching diet more closely and lost to 235 lbs.  Regained slowly over time. 225 lbs lowest adult weight 280 lbs highest adult weight  Patient lives with his wife.  She does the shopping and cooking.  Meal timing is an issue due to work schedule.  States that he ate healthier since the pandemic.  The cafeteria is now closed.  He likes salmon, red meat, chicken.   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  Height 6\' 2"  (1.88 m), weight 266 lb (120.7 kg). Body mass index is 34.15 kg/m.   Diabetes Self-Management Education - 12/07/20 0931      Visit Information   Visit Type First/Initial      Initial Visit   Diabetes Type Type 2    Are you currently following a meal plan? No    Are you taking your medications as prescribed? Yes    Date Diagnosed 2020      Health Coping   How would you rate your overall health? Fair      Psychosocial Assessment   Patient Belief/Attitude about Diabetes Motivated to manage diabetes    Self-care barriers None    Self-management support Doctor's office    Other persons present Patient    Patient Concerns Nutrition/Meal planning;Glycemic Control;Weight Control    Special Needs None    Learning Readiness Ready    How often do you need to have someone help you when you read instructions, pamphlets, or other written materials from your doctor or pharmacy? 1 - Never    What is the last grade level you completed in school? 16       Pre-Education Assessment   Patient understands the diabetes disease and treatment process. Needs Instruction    Patient understands incorporating nutritional management into lifestyle. Needs Instruction    Patient undertands incorporating physical activity into lifestyle. Needs Instruction    Patient understands using medications safely. Needs Instruction    Patient understands monitoring blood glucose, interpreting and using results Needs Instruction    Patient understands prevention, detection, and treatment of acute complications. Needs Instruction    Patient understands prevention, detection, and treatment of chronic complications. Needs Instruction    Patient understands how to develop strategies to address psychosocial issues. Needs Instruction    Patient understands how to develop strategies to promote health/change behavior. Needs Instruction      Complications   Last HgB A1C per patient/outside source 6.9 %   10/05/2020 increased from 6.7%   How often do you check your blood sugar? 1-2 times/day    Fasting Blood glucose range (mg/dL) 130-179;70-129   135-140 before breakfast and 105 before dinner   Postprandial Blood glucose range (mg/dL) 130-179   before bed   Number of hypoglycemic episodes per month 0    Number of hyperglycemic episodes per week 0    Have you had a dilated eye exam in the past 12 months? Yes    Have you  had a dental exam in the past 12 months? Yes    Are you checking your feet? Yes    How many days per week are you checking your feet? 4      Dietary Intake   Breakfast 2 eggs, 1-2% milk OR bagel (plain) 1-2% milk OR oatmeal, 1-2% milk on the weekend    Snack (morning) none    Lunch salad with grilled chicken OR Chicken tenders, pinto beans, green beans    Snack (afternoon) occasional NABS when more active    Dinner chicken sandwich, white beans OR hamburger steak, vegetables   6-10   Snack (evening) occasional hot chocolate    Beverage(s) water, unsweetened  tea, occasional regular hot chocolate      Exercise   Exercise Type Light (walking / raking leaves)    How many days per week to you exercise? 6    How many minutes per day do you exercise? 30    Total minutes per week of exercise 180      Patient Education   Previous Diabetes Education No    Disease state  Definition of diabetes, type 1 and 2, and the diagnosis of diabetes    Nutrition management  Role of diet in the treatment of diabetes and the relationship between the three main macronutrients and blood glucose level;Information on hints to eating out and maintain blood glucose control.;Meal options for control of blood glucose level and chronic complications.    Acute complications Taught treatment of hypoglycemia - the 15 rule.;Other (comment)    Chronic complications Relationship between chronic complications and blood glucose control;Retinopathy and reason for yearly dilated eye exams    Psychosocial adjustment Role of stress on diabetes;Worked with patient to identify barriers to care and solutions      Individualized Goals (developed by patient)   Nutrition General guidelines for healthy choices and portions discussed    Physical Activity Exercise 5-7 days per week;30 minutes per day    Medications take my medication as prescribed    Monitoring  test my blood glucose as discussed    Problem Solving meal timing and balance    Reducing Risk increase portions of healthy fats;examine blood glucose patterns      Post-Education Assessment   Patient understands the diabetes disease and treatment process. Demonstrates understanding / competency    Patient understands incorporating nutritional management into lifestyle. Demonstrates understanding / competency    Patient undertands incorporating physical activity into lifestyle. Demonstrates understanding / competency    Patient understands using medications safely. Demonstrates understanding / competency    Patient understands  monitoring blood glucose, interpreting and using results Demonstrates understanding / competency    Patient understands prevention, detection, and treatment of acute complications. Demonstrates understanding / competency    Patient understands prevention, detection, and treatment of chronic complications. Demonstrates understanding / competency    Patient understands how to develop strategies to address psychosocial issues. Demonstrates understanding / competency    Patient understands how to develop strategies to promote health/change behavior. Demonstrates understanding / competency      Outcomes   Expected Outcomes Demonstrated interest in learning. Expect positive outcomes    Future DMSE PRN    Program Status Completed           Individualized Plan for Diabetes Self-Management Training:   Learning Objective:  Patient will have a greater understanding of diabetes self-management. Patient education plan is to attend individual and/or group sessions per assessed needs and concerns.   Plan:  Patient Instructions  Increase your water intake Decrease saturated fat Consider adding good sources of Omega 3 such as Salmon, ground flax seeds, chia seeds or walnuts.     Variety of non starchy vegetables. Balance breakfast (eggs, fruit, 1 slice [employer] toast OR whole wheat English muffin and fruit OR oatmeal, berries, walnuts).  Continue legumes.  These can be added to a salad.  Continue to stay active.  Aim for at least 30 minutes most days.  Options for an earlier dinner?   Expected Outcomes:  Demonstrated interest in learning. Expect positive outcomes  Education material provided: ADA - How to Thrive: A Guide for Your Journey with Diabetes, Meal plan card and Diabetes Resources; Dining out tips with diabetes, cholesterol and triglycerides  If problems or questions, patient to contact team via:  Phone  Future DSME appointment: PRN

## 2020-12-08 ENCOUNTER — Other Ambulatory Visit: Payer: Managed Care, Other (non HMO)

## 2020-12-13 ENCOUNTER — Other Ambulatory Visit: Payer: Self-pay

## 2020-12-13 ENCOUNTER — Encounter: Payer: Self-pay | Admitting: Endocrinology

## 2020-12-13 ENCOUNTER — Ambulatory Visit: Payer: Managed Care, Other (non HMO) | Admitting: Endocrinology

## 2020-12-13 VITALS — BP 126/72 | HR 72 | Ht 74.0 in | Wt 268.8 lb

## 2020-12-13 DIAGNOSIS — I10 Essential (primary) hypertension: Secondary | ICD-10-CM | POA: Diagnosis not present

## 2020-12-13 DIAGNOSIS — E1165 Type 2 diabetes mellitus with hyperglycemia: Secondary | ICD-10-CM

## 2020-12-13 DIAGNOSIS — E782 Mixed hyperlipidemia: Secondary | ICD-10-CM

## 2020-12-13 MED ORDER — RYBELSUS 14 MG PO TABS: 14.0000 mg | ORAL_TABLET | Freq: Every day | ORAL | 3 refills | Status: DC

## 2020-12-13 NOTE — Progress Notes (Signed)
Patient ID: Lance Beard, male   DOB: 29-Oct-1960, 60 y.o.   MRN: [id number]           Referring physician: Tedra Senegal  Chief complaint: Management of diabetes and high triglycerides  History of Present Illness:    HYPERLIPIDEMIA:  He was found to have hypertriglyceridemia around the year 2010 Initially was tried on TriCor but he felt that it was causing excessive sweating and he did not take it for long He was subsequently switched to atorvastatin  On review of his lipids as far back as 2012 indicate that he has not had hypercholesterolemia but mostly high triglyceride levels with low HDL  On his initial consultation his Lipitor was stopped and fenofibrate started No side effects with this He has also seen the dietitian but only about a week ago Currently his weight is about the same as on his last visit  Triglycerides are relatively better at 212, HDL slightly better also LDL still controlled  Lab Results  Component Value Date   CHOL 132 12/07/2020   CHOL 97 10/05/2020   CHOL 105 08/17/2020   Lab Results  Component Value Date   HDL 26.70 (L) 12/07/2020   HDL 23 (L) 10/05/2020   HDL 28 (L) 08/17/2020   Lab Results  Component Value Date   LDLCALC 44 10/05/2020   LDLCALC 47 08/17/2020   LDLCALC 41 02/18/2020   Lab Results  Component Value Date   TRIG 212.0 (H) 12/07/2020   TRIG 257 (H) 10/05/2020   TRIG 238 (H) 08/17/2020   Lab Results  Component Value Date   CHOLHDL 5 12/07/2020   CHOLHDL 4.2 10/05/2020   CHOLHDL 3.8 08/17/2020   Lab Results  Component Value Date   LDLDIRECT 80.0 12/07/2020    Problem 2: DIABETES  Date of diagnosis of type 2 diabetes mellitus: 2020       Background history:   He had prediabetes with A1c 6% in 2019 which was unchanged until 07/2020 when it went up to 6.7. He apparently was prescribed glipizide ER 2.5 mg daily in 05/2019 which was continued until probably 2/22 He tried Metformin in 6/20 but may not have tolerated  this because of diarrhea Since his A1c had gone up in 12/21 he was given Januvia 100 mg daily Recent history:   Most recent A1c is 7.1  Non-insulin hypoglycemic drugs the patient is taking are: Rybelsus 7 mg daily  Current management, blood sugar patterns and problems identified:  He was switched from Januvia to Rybelsus in 3/22  He has gone up to 7 mg after initial 3 mg dosage for 30 days  He said that there is some improvement in satiety with this although weight has not gone down  He is still starting to walk in the mornings  Blood sugars are better compared to when he was on Januvia with lower morning average and less significant hyperglycemia        Side effects from medications have been: Possible GI intolerance to Metformin      Typical meal intake: Breakfast is usually a bagel               Glucose monitoring:  done <1 times a day         Glucometer: One Touch .       Blood Glucose readings by time of day and averages from meter download:   PRE-MEAL Fasting Lunch Dinner Bedtime Overall  Glucose range:  118-145      Mean/median:  140    133   POST-MEAL PC Breakfast PC Lunch PC Dinner  Glucose range:    128-174  Mean/median:    144   Previously:  PREMEAL Breakfast Lunch Dinner Bedtime  Overall   Glucose range:  136-209   94-127    Median:  158   111   145   POST-MEAL PC Breakfast PC Lunch PC Dinner  Glucose range:    106, 201  Median:       Dietician visit, most recent: 11/2020  Wt Readings from Last 3 Encounters:  12/13/20 268 lb 12.8 oz (121.9 kg)  12/07/20 266 lb (120.7 kg)  11/28/20 266 lb (120.7 kg)     Lab Results  Component Value Date   HGBA1C 7.1 (H) 12/07/2020   HGBA1C 6.9 (H) 10/05/2020   HGBA1C 6.7 (H) 08/17/2020   Lab Results  Component Value Date   MICROALBUR 0.7 08/17/2020   LDLCALC 44 10/05/2020   CREATININE 1.50 12/07/2020    Past Medical History:  Diagnosis Date  . Chronic kidney disease    kidney stones x2  . Diabetes  mellitus without complication ([place])   . Hx of cardiovascular stress test    a. ETT-MV 3/14:  No scar or ischemia, EF 62%  . Hyperlipidemia   . Hypertension   . PONV (postoperative nausea and vomiting)    with one surgery  . Sleep apnea    CPAP    Past Surgical History:  Procedure Laterality Date  . APPENDECTOMY    . COLONOSCOPY    . NASAL SEPTUM SURGERY    . VARICOCELE EXCISION      Family History  Problem Relation Age of Onset  . Diabetes Father   . Hypertension Father   . CAD Father        Approximate age 70   . Lung cancer Father   . Kidney disease Father   . Colon cancer Neg Hx   . Esophageal cancer Neg Hx   . Stomach cancer Neg Hx   . Rectal cancer Neg Hx     Social History:  reports that he has never smoked. He has never used smokeless tobacco. He reports that he does not drink alcohol and does not use drugs.  Allergies: No Known Allergies  Allergies as of 12/13/2020   No Known Allergies     Medication List       Accurate as of December 13, 2020  2:19 PM. If you have any questions, ask your nurse or doctor.        ALPRAZolam 0.25 MG tablet Commonly known as: XANAX Take 1 tablet (0.25 mg total) by mouth 2 (two) times daily as needed for anxiety.   aspirin 81 MG tablet Take 81 mg by mouth daily.   atenolol 25 MG tablet Commonly known as: TENORMIN TAKE 1 TABLET BY MOUTH EVERY DAY   atorvastatin 80 MG tablet Commonly known as: LIPITOR TAKE 1 TABLET BY MOUTH EVERY DAY   B-complex with vitamin C tablet Take 1 tablet by mouth daily.   fenofibrate 145 MG tablet Commonly known as: Tricor Take 1 tablet (145 mg total) by mouth daily.   hydrochlorothiazide 25 MG tablet Commonly known as: HYDRODIURIL Take 1 tablet (25 mg total) by mouth daily.   nitroGLYCERIN 0.4 MG SL tablet Commonly known as: NITROSTAT Place 1 tablet (0.4 mg total) under the tongue every 5 (five) minutes as needed for chest pain.   nystatin-triamcinolone cream Commonly known as:  MYCOLOG II Apply to  scrotum twice a day.   olmesartan 20 MG tablet Commonly known as: BENICAR TAKE 1 TABLET BY MOUTH EVERY DAY   ONE TOUCH ULTRA 2 w/Device Kit SMARTSIG:1 Via Meter Every Night   OneTouch Delica Lancets 09F Misc Check glucose twice a day   OneTouch Ultra test strip Generic drug: glucose blood Check glucose twice a day   Rybelsus 7 MG Tabs Generic drug: Semaglutide Take 1 tablet by mouth daily before breakfast. Take 30 minutes before breakfast with water       LABS:  Lab on 12/07/2020  Component Date Value Ref Range Status  . Cholesterol 12/07/2020 132  0 - 200 mg/dL Final   ATP III Classification       Desirable:  < 200 mg/dL               Borderline High:  200 - 239 mg/dL          High:  > = 240 mg/dL  . Triglycerides 12/07/2020 212.0* 0.0 - 149.0 mg/dL Final   Normal:  <150 mg/dLBorderline High:  150 - 199 mg/dL  . HDL 12/07/2020 26.70* >39.00 mg/dL Final  . VLDL 12/07/2020 42.4* 0.0 - 40.0 mg/dL Final  . Total CHOL/HDL Ratio 12/07/2020 5   Final                  Men          Women1/2 Average Risk     3.4          3.3Average Risk          5.0          4.42X Average Risk          9.6          7.13X Average Risk          15.0          11.0                      . NonHDL 12/07/2020 105.47   Final   NOTE:  Non-HDL goal should be 30 mg/dL higher than patient's LDL goal (i.e. LDL goal of < 70 mg/dL, would have non-HDL goal of < 100 mg/dL)  . Sodium 12/07/2020 138  135 - 145 mEq/L Final  . Potassium 12/07/2020 4.3  3.5 - 5.1 mEq/L Final  . Chloride 12/07/2020 102  96 - 112 mEq/L Final  . CO2 12/07/2020 29  19 - 32 mEq/L Final  . Glucose, Bld 12/07/2020 120* 70 - 99 mg/dL Final  . BUN 12/07/2020 28* 6 - 23 mg/dL Final  . Creatinine, Ser 12/07/2020 1.50  0.40 - 1.50 mg/dL Final  . Total Bilirubin 12/07/2020 0.8  0.2 - 1.2 mg/dL Final  . Alkaline Phosphatase 12/07/2020 15* 39 - 117 U/L Final  . AST 12/07/2020 24  0 - 37 U/L Final  . ALT 12/07/2020 51  0 - 53 U/L  Final  . Total Protein 12/07/2020 7.1  6.0 - 8.3 g/dL Final  . Albumin 12/07/2020 4.2  3.5 - 5.2 g/dL Final  . GFR 12/07/2020 50.81* >60.00 mL/min Final   Calculated using the CKD-EPI Creatinine Equation (2021)  . Calcium 12/07/2020 9.5  8.4 - 10.5 mg/dL Final  . Hgb A1c MFr Bld 12/07/2020 7.1* 4.6 - 6.5 % Final   Glycemic Control Guidelines for People with Diabetes:Non Diabetic:  <6%Goal of Therapy: <7%Additional Action Suggested:  >8%   . Direct LDL 12/07/2020 80.0  mg/dL Final  Optimal:  <100 mg/dLNear or Above Optimal:  100-129 mg/dLBorderline High:  130-159 mg/dLHigh:  160-189 mg/dLVery High:  >190 mg/dL        Review of Systems  Last eye exam was 06/2020 with no retinopathy  Hypertension: Currently on HCTZ, atenolol and Benicar  Recently has had some adjustment of doses from PCP BP at home 115/62  BP Readings from Last 3 Encounters:  12/13/20 126/72  11/28/20 110/60  11/03/20 130/70   Lab Results  Component Value Date   CREATININE 1.50 12/07/2020   CREATININE 1.06 08/17/2020   CREATININE 1.10 03/16/2020     PHYSICAL EXAM:  BP 126/72   Pulse 72   Ht _0  (1.88 m)   Wt 268 lb 12.8 oz (121.9 kg)   SpO2 96%   BMI 34.51 kg/m      ASSESSMENT:    DIABETES with obesity:  With starting Rybelsus his blood sugars appear to be overall improving Has no side effects with this Although has not lost weight he has only recently started working with the dietitian and walking for exercise Has checked blood sugars fairly consistently in the morning and after meals at night   HYPERLIPIDEMIA this is primarily hypertriglyceridemia and no history of increased LDL. He thinks he was taken off TriCor because of increased sweating but this seems to be unlikely side effect and will have better control of his lipids with fenofibrate compared to atorvastatin as triglycerides are still around 250 now    HYPERTENSION: He has had relatively lower blood pressure readings but may be  low normal at times  Creatinine is relatively higher    PLAN:    He will go up to 14 mg Rybelsus after finishing the 7 mg dose this month  Consistent exercise  Stay on fenofibrate and periodically will follow lipids, if not improved may consider adding Vascepa  He will call if he has any difficulties with consistently high blood sugar  To discuss blood pressure management with PCP especially with rising creatinine, need to likely reduce HCTZ to 12.5 mg   Elayne Snare 12/13/2020, 2:19 PM

## 2020-12-14 ENCOUNTER — Encounter: Payer: Self-pay | Admitting: Internal Medicine

## 2020-12-14 ENCOUNTER — Telehealth: Payer: Self-pay | Admitting: Internal Medicine

## 2020-12-14 NOTE — Telephone Encounter (Signed)
Dr. Kumar saw him and noted creatinine was elevated but BP was very stable. He is on HCTZ 25 mg Benicar,Tenormin. He suggested lowering HCTZ. Patient says it is scored. He will do that- cut dose of HCTZ in half to 12.5 mg and we will see him next Thursday April 28 to check B-met and BP. He will call early next week for appt as Wanda is off this week.  MJB,MD 

## 2020-12-14 NOTE — Progress Notes (Signed)
Many thanks- will do

## 2020-12-28 ENCOUNTER — Other Ambulatory Visit: Payer: Self-pay | Admitting: Internal Medicine

## 2021-01-18 ENCOUNTER — Encounter: Payer: Self-pay | Admitting: Internal Medicine

## 2021-01-18 ENCOUNTER — Ambulatory Visit: Payer: Managed Care, Other (non HMO) | Admitting: Internal Medicine

## 2021-01-18 ENCOUNTER — Other Ambulatory Visit: Payer: Self-pay

## 2021-01-18 VITALS — BP 102/60 | HR 91 | Ht 74.0 in | Wt 260.0 lb

## 2021-01-18 DIAGNOSIS — R031 Nonspecific low blood-pressure reading: Secondary | ICD-10-CM | POA: Diagnosis not present

## 2021-01-18 MED ORDER — METOPROLOL TARTRATE 25 MG PO TABS: ORAL_TABLET | ORAL | 1 refills | Status: DC

## 2021-01-18 NOTE — Progress Notes (Signed)
   Subjective:    Patient ID: Lance Beard, male    DOB: November 12, 1960, 60 y.o.   MRN: [id number]  HPI 60 year old Male seen for blood pressure management.  He sent a MyChart message on May 19 with multiple blood pressure readings.  In late May blood pressure was running between 035 and 248 systolically.  With this message, I had him discontinue Tenormin at that time.  He had already reduced HCTZ by half.  He is now here for follow-up.  Now that the [facility] [employer]'s race is over, he is under less stress and he may need less medication.  However he still has situational stress with [employer] development near his home  Review of Systems he was concerned about his blood pressure being too low.  He really did not have that much fatigue but was just concerned.     Objective:   Physical Exam Blood pressure 102/60 pulse 91 pulse oximetry 98% weight 260 pounds height 6 feet 2 inches BMI 33.38.  Chest clear to auscultation.  Cardiac exam: Regular rate and rhythm normal S1 and S2.  Extremities without edema.       Assessment & Plan:  Recent low blood pressure readings on multiple medication regimen for hypertension.  He will discontinue Tenormin and HCTZ.  He will discontinue Benicar.  He will try metoprolol 25 mg daily and send blood pressure readings via MyChart in 7 to 10 days.

## 2021-01-18 NOTE — Patient Instructions (Signed)
Discontinue Tenormin and HCTZ. Discontinue Benicar. Try Metoprolol 25 mg daily and send me blood pressure reading in 7-10 days.

## 2021-01-18 NOTE — Progress Notes (Signed)
   Subjective:    Patient ID: Lance Beard, male    DOB: September 16, 1960, 60 y.o.   MRN: [id number]  HPI    Review of Systems     Objective:   Physical Exam        Assessment & Plan:

## 2021-01-21 ENCOUNTER — Other Ambulatory Visit: Payer: Self-pay | Admitting: Endocrinology

## 2021-01-27 ENCOUNTER — Other Ambulatory Visit: Payer: Self-pay | Admitting: Internal Medicine

## 2021-02-01 IMAGING — CT CT HEART MORP W/ CTA COR W/ SCORE W/ CA W/CM &/OR W/O CM
2 of 13 series · 5 of 20 positions shown, 6 images · non-contrast
Comparison: None.

Addendum:
CLINICAL DATA: chestpain

EXAM:
Cardiac/Coronary  CTA
TECHNIQUE: The patient was scanned on a Siemens Somatoform go.Top scanner.

[Series 29: multiphase % cta coronary 0.60 · axial · 0.39mm/px · z∈[+1861,+1924]mm · 3 of 3110 slices shown, 4 images]
[im 778/3110  vessel]
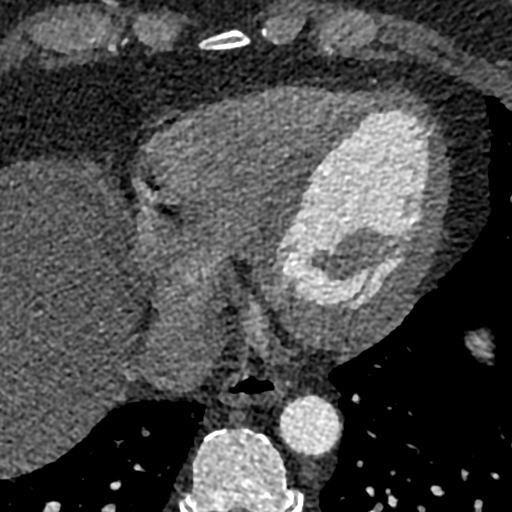
[im 778/3110  lung]
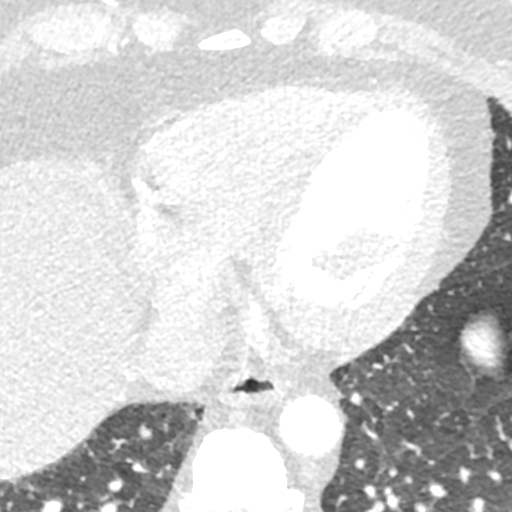
[im 1555/3110  vessel]
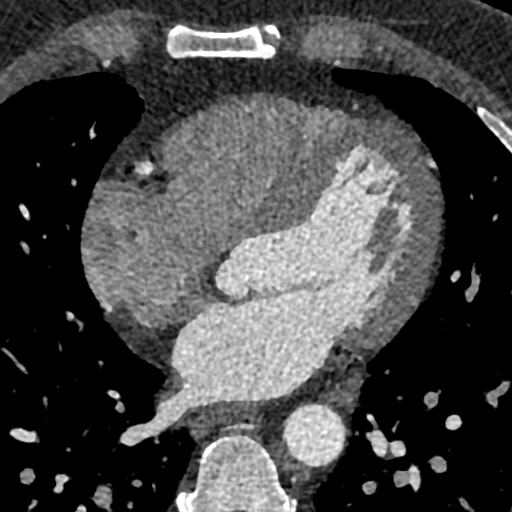
[im 2332/3110  vessel]
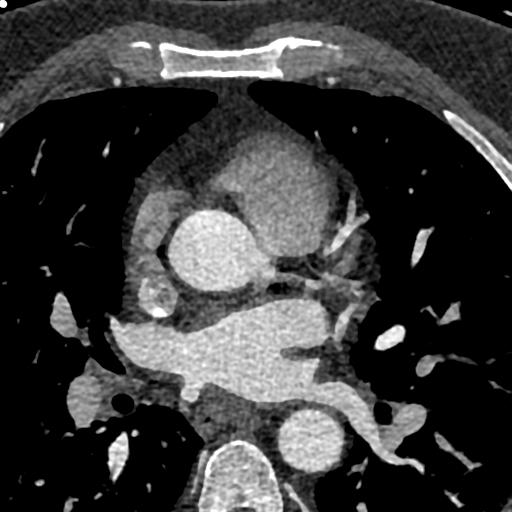

[Series 47: ms multiphase cta coronary 0.60 · axial · 0.39mm/px · z∈[+1872,+1913]mm · 2 of 2488 slices shown]
[im 830/2488  vessel]
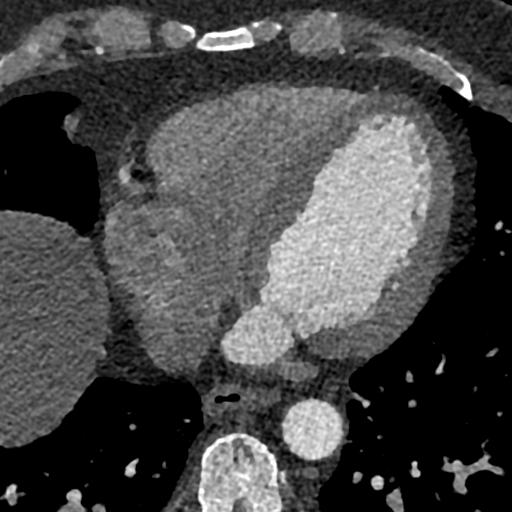
[im 1659/2488  vessel]
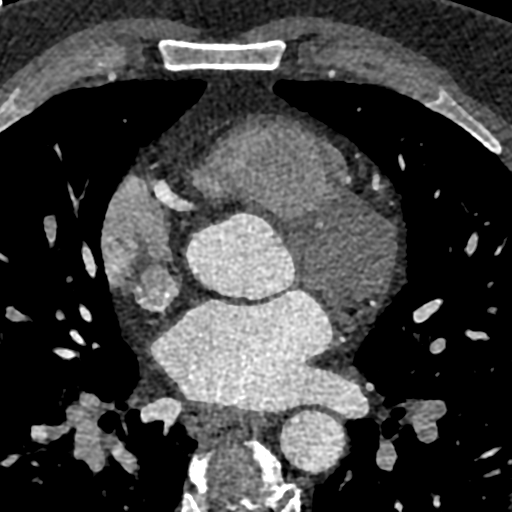

[5 of 20 positions shown; findings below may reference images not displayed]

FINDINGS: A retrospective scan was triggered in the descending thoracic aorta.
Axial non-contrast 3 mm slices were carried out through the heart.
The data set was analyzed on a dedicated work station and scored
using the Agatson method. Gantry rotation speed was 330 msecs and
collimation was .6 mm. 50mg of metoprolol po, 10mg metoprolol iv,
and 0.8 mg of sl NTG was given. The 3D data set was reconstructed in
5% intervals of the 45-95 % of the R-R cycle. Diastolic phases were
analyzed on a dedicated work station using MPR, MIP and VRT modes.
The patient received 90 cc of contrast.

Aorta:  Normal size.  No calcifications.  No dissection.

Aortic Valve:  Trileaflet.  No calcifications.

Coronary Arteries:  Normal coronary origin.  Right dominance.

RCA is a large dominant artery that gives rise to PDA and PLA. There
is no plaque.

Left main is a large artery that gives rise to LAD and LCX arteries.

LAD is a large vessel that has mild calcified plaque in the proximal
segment causing minimal stenosis (0-24%).

LCX is a non-dominant artery that gives rise to two obtuse marginal
branches. There is no plaque.

Other findings:

Normal pulmonary vein drainage into the left atrium.

Normal left atrial appendage without a thrombus.

Normal size of the pulmonary artery.
IMPRESSION: 1. Coronary calcium score of 78.2. This was 69th percentile for age
and sex matched control.

2. Normal coronary origin with right dominance.

3. Mild calcified plaque in the proximal LAD causing minimal
stenosis (0-24%)

4. CAD-RADS 1. Minimal non-obstructive CAD (0-24%). Consider
non-atherosclerotic causes of chest pain. Consider preventive
therapy and risk factor modification.

EXAM:
OVER-READ INTERPRETATION  CT CHEST

The following report is an over-read performed by radiologist Dr.
over-read does not include interpretation of cardiac or coronary
anatomy or pathology. The coronary CTA and coronary calcium score
interpretation by the cardiologist is attached.
FINDINGS: Vascular: No pericardial effusion. No significant vascular
abnormality identified outside of the heart.

Mediastinum/nodes: No mass or adenopathy identified.

Lungs/pleura: Dependent type changes are noted within the lung bases
posteriorly. No pleural effusion, airspace consolidation, or
atelectasis. No suspicious lung nodules.

Upper abdomen: No acute abnormality.

Musculoskeletal: No acute or significant osseous abnormality.
IMPRESSION: 1. Negative over read. No significant noncardiac supplemental
findings identified.

*** End of Addendum ***
FINDINGS: A retrospective scan was triggered in the descending thoracic aorta.
Axial non-contrast 3 mm slices were carried out through the heart.
The data set was analyzed on a dedicated work station and scored
using the Agatson method. Gantry rotation speed was 330 msecs and
collimation was .6 mm. 50mg of metoprolol po, 10mg metoprolol iv,
and 0.8 mg of sl NTG was given. The 3D data set was reconstructed in
5% intervals of the 45-95 % of the R-R cycle. Diastolic phases were
analyzed on a dedicated work station using MPR, MIP and VRT modes.
The patient received 90 cc of contrast.

Aorta:  Normal size.  No calcifications.  No dissection.

Aortic Valve:  Trileaflet.  No calcifications.

Coronary Arteries:  Normal coronary origin.  Right dominance.

RCA is a large dominant artery that gives rise to PDA and PLA. There
is no plaque.

Left main is a large artery that gives rise to LAD and LCX arteries.

LAD is a large vessel that has mild calcified plaque in the proximal
segment causing minimal stenosis (0-24%).

LCX is a non-dominant artery that gives rise to two obtuse marginal
branches. There is no plaque.

Other findings:

Normal pulmonary vein drainage into the left atrium.

Normal left atrial appendage without a thrombus.

Normal size of the pulmonary artery.
IMPRESSION: 1. Coronary calcium score of 78.2. This was 69th percentile for age
and sex matched control.

2. Normal coronary origin with right dominance.

3. Mild calcified plaque in the proximal LAD causing minimal
stenosis (0-24%)

4. CAD-RADS 1. Minimal non-obstructive CAD (0-24%). Consider
non-atherosclerotic causes of chest pain. Consider preventive
therapy and risk factor modification.

## 2021-02-15 ENCOUNTER — Other Ambulatory Visit: Payer: Self-pay

## 2021-02-15 ENCOUNTER — Encounter: Payer: Self-pay | Admitting: Internal Medicine

## 2021-02-15 ENCOUNTER — Ambulatory Visit: Payer: Managed Care, Other (non HMO) | Admitting: Internal Medicine

## 2021-02-15 VITALS — BP 120/70 | HR 76 | Temp 97.9°F | Ht 74.0 in | Wt 256.0 lb

## 2021-02-15 DIAGNOSIS — K30 Functional dyspepsia: Secondary | ICD-10-CM | POA: Diagnosis not present

## 2021-02-15 DIAGNOSIS — E781 Pure hyperglyceridemia: Secondary | ICD-10-CM

## 2021-02-15 DIAGNOSIS — E119 Type 2 diabetes mellitus without complications: Secondary | ICD-10-CM | POA: Diagnosis not present

## 2021-02-15 DIAGNOSIS — I10 Essential (primary) hypertension: Secondary | ICD-10-CM

## 2021-02-15 MED ORDER — PANTOPRAZOLE SODIUM 40 MG PO TBEC: 40.0000 mg | DELAYED_RELEASE_TABLET | Freq: Every day | ORAL | 0 refills | Status: DC

## 2021-02-15 NOTE — Progress Notes (Deleted)
Protonix.  ?

## 2021-02-15 NOTE — Telephone Encounter (Signed)
Schedule appointment?

## 2021-02-16 LAB — CBC WITH DIFFERENTIAL/PLATELET
Absolute Monocytes: 882 cells/uL (ref 200–950)
Basophils Absolute: 38 cells/uL (ref 0–200)
Basophils Relative: 0.5 %
Eosinophils Absolute: 99 cells/uL (ref 15–500)
Eosinophils Relative: 1.3 %
HCT: 48 % (ref 38.5–50.0)
Hemoglobin: 16.8 g/dL (ref 13.2–17.1)
Lymphs Abs: 2067 cells/uL (ref 850–3900)
MCH: 33.5 pg — ABNORMAL HIGH (ref 27.0–33.0)
MCHC: 35 g/dL (ref 32.0–36.0)
MCV: 95.6 fL (ref 80.0–100.0)
MPV: 9.9 fL (ref 7.5–12.5)
Monocytes Relative: 11.6 %
Neutro Abs: 4514 cells/uL (ref 1500–7800)
Neutrophils Relative %: 59.4 %
Platelets: 282 10*3/uL (ref 140–400)
RBC: 5.02 10*6/uL (ref 4.20–5.80)
RDW: 13.6 % (ref 11.0–15.0)
Total Lymphocyte: 27.2 %
WBC: 7.6 10*3/uL (ref 3.8–10.8)

## 2021-02-16 LAB — COMPLETE METABOLIC PANEL WITH GFR
AG Ratio: 1.9 (calc) (ref 1.0–2.5)
ALT: 33 U/L (ref 9–46)
AST: 18 U/L (ref 10–35)
Albumin: 4.7 g/dL (ref 3.6–5.1)
Alkaline phosphatase (APISO): 14 U/L — ABNORMAL LOW (ref 35–144)
BUN/Creatinine Ratio: 17 (calc) (ref 6–22)
BUN: 29 mg/dL — ABNORMAL HIGH (ref 7–25)
CO2: 31 mmol/L (ref 20–32)
Calcium: 11.7 mg/dL — ABNORMAL HIGH (ref 8.6–10.3)
Chloride: 99 mmol/L (ref 98–110)
Creat: 1.69 mg/dL — ABNORMAL HIGH (ref 0.70–1.33)
GFR, Est African American: 50 mL/min/{1.73_m2} — ABNORMAL LOW (ref >=60)
GFR, Est Non African American: 43 mL/min/{1.73_m2} — ABNORMAL LOW (ref >=60)
Globulin: 2.5 g/dL (calc) (ref 1.9–3.7)
Glucose, Bld: 95 mg/dL (ref 65–99)
Potassium: 4.3 mmol/L (ref 3.5–5.3)
Sodium: 141 mmol/L (ref 135–146)
Total Bilirubin: 1.2 mg/dL (ref 0.2–1.2)
Total Protein: 7.2 g/dL (ref 6.1–8.1)

## 2021-02-16 LAB — HEMOGLOBIN A1C
Hgb A1c MFr Bld: 5.3 % of total Hgb (ref <5.7)
Mean Plasma Glucose: 105 mg/dL
eAG (mmol/L): 5.8 mmol/L

## 2021-02-17 ENCOUNTER — Telehealth: Payer: Self-pay | Admitting: Internal Medicine

## 2021-02-17 DIAGNOSIS — K649 Unspecified hemorrhoids: Secondary | ICD-10-CM

## 2021-02-17 MED ORDER — HYDROCORTISONE ACE-PRAMOXINE 1-1 % EX CREA: 1.0000 "application " | TOPICAL_CREAM | Freq: Two times a day (BID) | CUTANEOUS | 0 refills | Status: DC

## 2021-02-17 NOTE — Patient Instructions (Addendum)
Labs drawn and pending.  Further instructions to follow.  GI cocktail 30 cc given by mouth.  Generic Protonix 40 mg daily.  Continue with Rybelsus for now.  Call if symptoms worsen.  We will call you with lab results.  Avoid spicy foods.

## 2021-02-17 NOTE — Telephone Encounter (Signed)
Called patient to discuss results from Thursday. He now has a bleeding hemorrhoid. Have called in Proctocream [facility] for this. His creatinine and serum calcium are elevated. However, his epigastric discomfort has improved with PPI. May have been volume depleted on date of visit. Not sure why serum calcium was elevated. Advised that he return Monday for OV and repeat labs. Stay well hydrated and continue PPI.

## 2021-02-17 NOTE — Progress Notes (Signed)
   Subjective:    Patient ID: Lance Beard, male    DOB: 1961/06/08, 60 y.o.   MRN: [id number]  HPI He sent My Chart message complaining of indigestion symptoms. Has had excellent accuchecks on Rybelsus. Is concerned the abdominal symptoms are related to this medication. Decreased appetitie and some unusual skin sensitivity. No rash. OV advised and he is here for evaluation.  He has less stressed now that his election for [employer] is over and he was victorious.  He has a history of impaired glucose tolerance, hypertension and hypertriglyceridemia.  Endocrinologist has him on Rybelsus and he has a follow-up appointment with endocrinologist in July.  No symptoms consistent with COVID-19 and he has been vaccinated x3.   Review of Systems no fever or chills. No vomiting. No diarrhea. No known Covid-19 exposure. Maybe slight constipation. 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 A lot of the work can be heard at night while trying to sleep.  No chest pain.     Objective:   Physical Exam BP 120/70 ,pulse 76 ,T.97.9 degrees ,pulse ox 97% weight 256 pounds Ht. 6 ft 2 in, BMI 32.87  Skin: warm and dry.  No thyromegaly.  No carotid bruits.  Chest is clear to auscultation.  Cardiac exam: Regular rate and rhythm normal S1 and S2.  Abdomen is soft nondistended with some mild epigastric tenderness.  No rebound tenderness appreciated.  No lower extremity edema.      Assessment & Plan:   Epigastric discomfort-etiology unclear could be related to diabetic medication or could be gastritis/peptic ulcer disease/GE reflux  Plan: He was given a GI cocktail 30 cc p.o. in the office today and started on Protonix 40 mg daily.  Labs drawn including hemoglobin A1c excellent at 5.3%.  C-Met with creatinine 1.69 in 2 months ago was 1.50.  Glucose excellent at 95.  For some reason his calcium is elevated at 11.7.  Alkaline phosphatase is low at 14.  SGOT and  SGPT are normal.  White blood cell count is normal.  Size of red cells is normal.  Platelet count is normal.  Hemoglobin is normal.  Addendum: See phone call on Saturday, June 25.  Patient has a bleeding hemorrhoid and ProctoCream will be called in for him.  He will follow-up on Monday due to abnormal labs.

## 2021-02-19 ENCOUNTER — Ambulatory Visit: Payer: Managed Care, Other (non HMO) | Admitting: Internal Medicine

## 2021-02-19 ENCOUNTER — Other Ambulatory Visit: Payer: Self-pay

## 2021-02-19 VITALS — BP 120/70 | HR 96 | Temp 98.1°F | Ht 74.0 in | Wt 255.0 lb

## 2021-02-19 DIAGNOSIS — K645 Perianal venous thrombosis: Secondary | ICD-10-CM | POA: Diagnosis not present

## 2021-02-19 DIAGNOSIS — R7989 Other specified abnormal findings of blood chemistry: Secondary | ICD-10-CM | POA: Diagnosis not present

## 2021-02-19 NOTE — Progress Notes (Signed)
   Subjective:    Patient ID: Lance Beard, male    DOB: Jun 18, 1961, 60 y.o.   MRN: [id number]  HPI  He is here with an irritated, inflamed hemorrhoid which has been bleeding.  Bright red blood noted.  On June 20.  On June 25 had phone conversation with him regarding  lab results and he mentioned having a thrombosed hemorrhoid.  After phone call on June 25 I called in [facility] for him.  Advised sitz bath's for hemorrhoid.  Recent hemoglobin A1c was excellent at 5.3% down from 7.1% in April.  However he had an elevated creatinine of 1.69 from 1.50 in April.  Calcium was elevated at 11.7 but has been stable previously at 9.5 several times.  Was having some indigestion symptoms.  He has a history of impaired glucose tolerance, hypertension and hypertriglyceridemia. CBC was basically normal.   Review of Systems see above has been a bit constipated     Objective:   Physical Exam Vital signs reviewed and he is afebrile.  Blood pressure excellent at 120/70  He has a thrombosed hemorrhoid perirectal area that has been bleeding.     Assessment & Plan:  Continue with ProctoCream-HC and sitz baths.  This should resolve on its own within a week or so.  He is to make sure he eats plenty of fiber and is not constipated.  Can take softener if he so desires.  If symptoms worsen, can refer to urgent care or [facility] to have thrombosed hemorrhoid incised and drained  Regarding elevated serum calcium of 11.7 this level was repeated and found to be 9.4 intact PTH was normal at 35.  I do not think he has an issue with hypercalcemia.  He has an appointment with Dr. Dwyane Dee July 14.  His creatinine has improved from 1.69 on June 23 to 1.24 today.

## 2021-02-20 LAB — COMPLETE METABOLIC PANEL WITH GFR
AG Ratio: 2 (calc) (ref 1.0–2.5)
ALT: 29 U/L (ref 9–46)
AST: 16 U/L (ref 10–35)
Albumin: 4.3 g/dL (ref 3.6–5.1)
Alkaline phosphatase (APISO): 13 U/L — ABNORMAL LOW (ref 35–144)
BUN: 18 mg/dL (ref 7–25)
CO2: 24 mmol/L (ref 20–32)
Calcium: 9.4 mg/dL (ref 8.6–10.3)
Chloride: 106 mmol/L (ref 98–110)
Creat: 1.24 mg/dL (ref 0.70–1.33)
GFR, Est African American: 73 mL/min/{1.73_m2} (ref >=60)
GFR, Est Non African American: 63 mL/min/{1.73_m2} (ref >=60)
Globulin: 2.2 g/dL (calc) (ref 1.9–3.7)
Glucose, Bld: 86 mg/dL (ref 65–99)
Potassium: 4.2 mmol/L (ref 3.5–5.3)
Sodium: 140 mmol/L (ref 135–146)
Total Bilirubin: 0.9 mg/dL (ref 0.2–1.2)
Total Protein: 6.5 g/dL (ref 6.1–8.1)

## 2021-02-20 LAB — PTH, INTACT AND CALCIUM
Calcium: 9.4 mg/dL (ref 8.6–10.3)
PTH: 35 pg/mL (ref 16–77)

## 2021-02-21 ENCOUNTER — Other Ambulatory Visit: Payer: Self-pay | Admitting: Internal Medicine

## 2021-03-05 ENCOUNTER — Other Ambulatory Visit: Payer: Self-pay

## 2021-03-05 ENCOUNTER — Other Ambulatory Visit (INDEPENDENT_AMBULATORY_CARE_PROVIDER_SITE_OTHER): Payer: Managed Care, Other (non HMO)

## 2021-03-05 DIAGNOSIS — E1165 Type 2 diabetes mellitus with hyperglycemia: Secondary | ICD-10-CM

## 2021-03-05 DIAGNOSIS — E782 Mixed hyperlipidemia: Secondary | ICD-10-CM | POA: Diagnosis not present

## 2021-03-05 LAB — COMPREHENSIVE METABOLIC PANEL
ALT: 50 U/L (ref 0–53)
AST: 23 U/L (ref 0–37)
Albumin: 4.4 g/dL (ref 3.5–5.2)
Alkaline Phosphatase: 12 U/L — ABNORMAL LOW (ref 39–117)
BUN: 14 mg/dL (ref 6–23)
CO2: 28 mEq/L (ref 19–32)
Calcium: 9.2 mg/dL (ref 8.4–10.5)
Chloride: 104 mEq/L (ref 96–112)
Creatinine, Ser: 1.24 mg/dL (ref 0.40–1.50)
GFR: 63.75 mL/min (ref >60.00)
Glucose, Bld: 89 mg/dL (ref 70–99)
Potassium: 4.1 mEq/L (ref 3.5–5.1)
Sodium: 140 mEq/L (ref 135–145)
Total Bilirubin: 0.8 mg/dL (ref 0.2–1.2)
Total Protein: 6.8 g/dL (ref 6.0–8.3)

## 2021-03-05 LAB — LIPID PANEL
Cholesterol: 127 mg/dL (ref 0–200)
HDL: 25.8 mg/dL — ABNORMAL LOW (ref >39.00)
LDL Cholesterol: 70 mg/dL (ref 0–99)
NonHDL: 100.75
Total CHOL/HDL Ratio: 5
Triglycerides: 152 mg/dL — ABNORMAL HIGH (ref 0.0–149.0)
VLDL: 30.4 mg/dL (ref 0.0–40.0)

## 2021-03-05 LAB — HEMOGLOBIN A1C: Hgb A1c MFr Bld: 5.1 % (ref 4.6–6.5)

## 2021-03-08 ENCOUNTER — Other Ambulatory Visit: Payer: Self-pay

## 2021-03-08 ENCOUNTER — Ambulatory Visit: Payer: Managed Care, Other (non HMO) | Admitting: Endocrinology

## 2021-03-08 ENCOUNTER — Encounter: Payer: Self-pay | Admitting: Endocrinology

## 2021-03-08 ENCOUNTER — Other Ambulatory Visit: Payer: Managed Care, Other (non HMO) | Admitting: Internal Medicine

## 2021-03-08 VITALS — BP 142/72 | HR 84 | Ht 74.0 in | Wt 258.0 lb

## 2021-03-08 DIAGNOSIS — E1169 Type 2 diabetes mellitus with other specified complication: Secondary | ICD-10-CM

## 2021-03-08 DIAGNOSIS — E782 Mixed hyperlipidemia: Secondary | ICD-10-CM

## 2021-03-08 DIAGNOSIS — E669 Obesity, unspecified: Secondary | ICD-10-CM | POA: Diagnosis not present

## 2021-03-08 NOTE — Progress Notes (Signed)
Patient ID: Lance Beard, male   DOB: Dec 16, 1960, 60 y.o.   MRN: [id number]           Referring physician: Tedra Sen[place]  Chief complaint: Follow-up of diabetes and high triglycerides  History of Present Illness:    HYPERLIPIDEMIA:  He was found to have hypertriglyceridemia around the year 2010 Initially was tried on TriCor but he felt that it was causing excessive sweating and he did not take it for long He was subsequently switched to atorvastatin  On review of his lipids as far back as 2012 indicate that he has not had hypercholesterolemia but mostly high triglyceride levels with low HDL  On his initial consultation his Lipitor was stopped and fenofibrate started  He has also seen the dietitian   His weight has come down significantly since his first visit in 3/22  Triglycerides are relatively better at 152 compared to 212 LDL is still controlled and below 100  Lab Results  Component Value Date   CHOL 127 03/05/2021   CHOL 132 12/07/2020   CHOL 97 10/05/2020   Lab Results  Component Value Date   HDL 25.80 (L) 03/05/2021   HDL 26.70 (L) 12/07/2020   HDL 23 (L) 10/05/2020   Lab Results  Component Value Date   LDLCALC 70 03/05/2021   LDLCALC 44 10/05/2020   LDLCALC 47 08/17/2020   Lab Results  Component Value Date   TRIG 152.0 (H) 03/05/2021   TRIG 212.0 (H) 12/07/2020   TRIG 257 (H) 10/05/2020   Lab Results  Component Value Date   CHOLHDL 5 03/05/2021   CHOLHDL 5 12/07/2020   CHOLHDL 4.2 10/05/2020   Lab Results  Component Value Date   LDLDIRECT 80.0 12/07/2020    Problem 2: DIABETES  Date of diagnosis of type 2 diabetes mellitus: 2020       Background history:   He had prediabetes with A1c 6% in 2019 which was unchanged until 07/2020 when it went up to 6.7. He apparently was prescribed glipizide ER 2.5 mg daily in 05/2019 which was continued until probably 2/22 He tried Metformin in 6/20 but may not have tolerated this because of diarrhea Since  his A1c had gone up in 12/21 he was given Januvia 100 mg daily  Recent history:   Most recent A1c is 5.1  Non-insulin hypoglycemic drugs the patient is taking are: Rybelsus 14 mg daily  Current management, blood sugar patterns and problems identified: He was switched from Januvia to Rybelsus in 3/22 He has gone up to 14 mg after his last visit and 11/2020 No nausea with this He says he is not able to finish his meals although he does not think his appetite is markedly decreased Has gradually lost 10 pounds of weight since last visit He is still able to walk in the mornings His blood sugars at home are practically normal and highest only 133        Side effects from medications have been: Possible GI intolerance to Metformin     Typical meal intake: Breakfast is usually a bagel               Glucose monitoring:  done <1 times a day         Glucometer: One Touch .       Blood Glucose readings by time of day and averages from meter download:  AVERAGE 103 with range 71-133 Has morning average about 105 and late evening 107  Previously  PRE-MEAL Fasting Lunch  Dinner Bedtime Overall  Glucose range:  118-145      Mean/median:  140    133   POST-MEAL PC Breakfast PC Lunch PC Dinner  Glucose range:    128-174  Mean/median:    144      Dietician visit, most recent: 11/2020  Wt Readings from Last 3 Encounters:  03/08/21 258 lb (117 kg)  02/19/21 255 lb (115.7 kg)  02/15/21 256 lb (116.1 kg)     Lab Results  Component Value Date   HGBA1C 5.1 03/05/2021   HGBA1C 5.3 02/15/2021   HGBA1C 7.1 (H) 12/07/2020   Lab Results  Component Value Date   MICROALBUR 0.7 08/17/2020   LDLCALC 70 03/05/2021   CREATININE 1.24 03/05/2021    Past Medical History:  Diagnosis Date   Chronic kidney disease    kidney stones x2   Diabetes mellitus without complication ([place])    Hx of cardiovascular stress test    a. ETT-MV 3/14:  No scar or ischemia, EF 62%   Hyperlipidemia     Hypertension    PONV (postoperative nausea and vomiting)    with one surgery   Sleep apnea    CPAP    Past Surgical History:  Procedure Laterality Date   APPENDECTOMY     COLONOSCOPY     NASAL SEPTUM SURGERY     VARICOCELE EXCISION      Family History  Problem Relation Age of Onset   Diabetes Father    Hypertension Father    CAD Father        Approximate age 64    Lung cancer Father    Kidney disease Father    Colon cancer Neg Hx    Esophageal cancer Neg Hx    Stomach cancer Neg Hx    Rectal cancer Neg Hx     Social History:  reports that he has never smoked. He has never used smokeless tobacco. He reports that he does not drink alcohol and does not use drugs.  Allergies: No Known Allergies  Allergies as of 03/08/2021   No Known Allergies      Medication List        Accurate as of March 08, 2021  4:31 PM. If you have any questions, ask your nurse or doctor.          ALPRAZolam 0.25 MG tablet Commonly known as: XANAX Take 1 tablet (0.25 mg total) by mouth 2 (two) times daily as needed for anxiety.   aspirin 81 MG tablet Take 81 mg by mouth daily.   B-complex with vitamin C tablet Take 1 tablet by mouth daily.   fenofibrate 145 MG tablet Commonly known as: TRICOR TAKE 1 TABLET BY MOUTH EVERY DAY   metoprolol tartrate 25 MG tablet Commonly known as: LOPRESSOR One tab daily in am   nitroGLYCERIN 0.4 MG SL tablet Commonly known as: NITROSTAT Place 1 tablet (0.4 mg total) under the tongue every 5 (five) minutes as needed for chest pain.   nystatin-triamcinolone cream Commonly known as: MYCOLOG II Apply to scrotum twice a day.   ONE TOUCH ULTRA 2 w/Device Kit SMARTSIG:1 Via Meter Every Night   OneTouch Delica Lancets 74F Misc Check glucose twice a day   OneTouch Ultra test strip Generic drug: glucose blood Check glucose twice a day   pantoprazole 40 MG tablet Commonly known as: PROTONIX Take 1 tablet (40 mg total) by mouth daily.    pramoxine-hydrocortisone 1-1 % rectal cream Commonly known as: PROCTOCREAM-HC Place 1  application rectally 2 (two) times daily.   Rybelsus 14 MG Tabs Generic drug: Semaglutide Take 14 mg by mouth daily.        LABS:  Lab on 03/05/2021  Component Date Value Ref Range Status   Cholesterol 03/05/2021 127  0 - 200 mg/dL Final   ATP III Classification       Desirable:  < 200 mg/dL               Borderline High:  200 - 239 mg/dL          High:  > = 240 mg/dL   Triglycerides 03/05/2021 152.0 (A) 0.0 - 149.0 mg/dL Final   Normal:  <150 mg/dLBorderline High:  150 - 199 mg/dL   HDL 03/05/2021 25.80 (A) >39.00 mg/dL Final   VLDL 03/05/2021 30.4  0.0 - 40.0 mg/dL Final   LDL Cholesterol 03/05/2021 70  0 - 99 mg/dL Final   Total CHOL/HDL Ratio 03/05/2021 5   Final                  Men          Women1/2 Average Risk     3.4          3.3Average Risk          5.0          4.42X Average Risk          9.6          7.13X Average Risk          15.0          11.0                       NonHDL 03/05/2021 100.75   Final   NOTE:  Non-HDL goal should be 30 mg/dL higher than patient's LDL goal (i.e. LDL goal of < 70 mg/dL, would have non-HDL goal of < 100 mg/dL)   Sodium 03/05/2021 140  135 - 145 mEq/L Final   Potassium 03/05/2021 4.1  3.5 - 5.1 mEq/L Final   Chloride 03/05/2021 104  96 - 112 mEq/L Final   CO2 03/05/2021 28  19 - 32 mEq/L Final   Glucose, Bld 03/05/2021 89  70 - 99 mg/dL Final   BUN 03/05/2021 14  6 - 23 mg/dL Final   Creatinine, Ser 03/05/2021 1.24  0.40 - 1.50 mg/dL Final   Total Bilirubin 03/05/2021 0.8  0.2 - 1.2 mg/dL Final   Alkaline Phosphatase 03/05/2021 12 (A) 39 - 117 U/L Final   AST 03/05/2021 23  0 - 37 U/L Final   ALT 03/05/2021 50  0 - 53 U/L Final   Total Protein 03/05/2021 6.8  6.0 - 8.3 g/dL Final   Albumin 03/05/2021 4.4  3.5 - 5.2 g/dL Final   GFR 03/05/2021 63.75  >60.00 mL/min Final   Calculated using the CKD-EPI Creatinine Equation (2021)   Calcium 03/05/2021  9.2  8.4 - 10.5 mg/dL Final   Hgb A1c MFr Bld 03/05/2021 5.1  4.6 - 6.5 % Final   Glycemic Control Guidelines for People with Diabetes:Non Diabetic:  <6%Goal of Therapy: <7%Additional Action Suggested:  >8%         Review of Systems  Last eye exam was 06/2020 with no retinopathy  Hypertension: Has been following up with his PCP for this  BP Readings from Last 3 Encounters:  03/08/21 (!) 142/72  02/19/21 120/70  02/15/21 120/70   Lab Results  Component Value Date   CREATININE 1.24 03/05/2021   CREATININE 1.24 02/19/2021   CREATININE 1.69 (H) 02/15/2021     PHYSICAL EXAM:  BP (!) 142/72   Pulse 84   Ht 6' 2"  (1.88 m)   Wt 258 lb (117 kg)   SpO2 98%   BMI 33.13 kg/m      ASSESSMENT:   DIABETES with obesity:  With Rybelsus his blood sugars have progressively improved A1c is back to normal at 5.1 surprisingly  His weight is down almost 20 pounds since his first visit This does reduce his appetite but he does not think this is excessive Also has started walking Blood sugars are practically normal at home, fasting average 105  HYPERTRIGLYCERIDEMIA and no history of increased LDL. Done well with Tricor but also with weight loss his triglycerides are just above 150 Has also maintained a good LDL level without a statin drug  Hypertension followed by PCP   PLAN:   Although he would like to cut back on medications discussed benefits of cardiovascular risk reduction, maintenance of weight loss and improving insulin action with Rybelsus long-term He will maintain his 14 mg dose Stay on fenofibrate and periodically will follow lipids, He will continue his excellent program with diet and increasing walking    Elayne Snare 03/08/2021, 4:31 PM

## 2021-03-09 ENCOUNTER — Ambulatory Visit: Payer: Managed Care, Other (non HMO) | Admitting: Internal Medicine

## 2021-03-13 ENCOUNTER — Other Ambulatory Visit: Payer: Self-pay | Admitting: Internal Medicine

## 2021-03-24 ENCOUNTER — Encounter: Payer: Self-pay | Admitting: Internal Medicine

## 2021-03-24 NOTE — Patient Instructions (Addendum)
Continue using ProctoCream.  Sitz bath's 20 minutes at least once a day.  If symptoms do not improve or worsen, please call back and we can refer you to Emergency department, urgent care or surgeon for incision and drainage.  Serum calcium repeated today.  Intact PTH drawn.  Stay well-hydrated.

## 2021-03-30 ENCOUNTER — Telehealth: Payer: Managed Care, Other (non HMO) | Admitting: Internal Medicine

## 2021-03-30 VITALS — BP 137/78 | HR 86 | Temp 98.8°F

## 2021-03-30 DIAGNOSIS — E781 Pure hyperglyceridemia: Secondary | ICD-10-CM

## 2021-03-30 DIAGNOSIS — U071 COVID-19: Secondary | ICD-10-CM

## 2021-03-30 DIAGNOSIS — I10 Essential (primary) hypertension: Secondary | ICD-10-CM

## 2021-03-30 DIAGNOSIS — K219 Gastro-esophageal reflux disease without esophagitis: Secondary | ICD-10-CM

## 2021-03-30 DIAGNOSIS — E119 Type 2 diabetes mellitus without complications: Secondary | ICD-10-CM

## 2021-03-30 MED ORDER — NIRMATRELVIR/RITONAVIR (PAXLOVID) TABLET (RENAL DOSING): 2.0000 | ORAL_TABLET | Freq: Two times a day (BID) | ORAL | 0 refills | Status: AC

## 2021-03-30 MED ORDER — HYDROCODONE BIT-HOMATROP MBR 5-1.5 MG/5ML PO SOLN
5.0000 mL | Freq: Three times a day (TID) | ORAL | 0 refills | Status: DC | PRN
Start: 2021-03-30 — End: 2021-04-19

## 2021-03-30 NOTE — Progress Notes (Signed)
   Subjective:    Patient ID: Lance Beard, male    DOB: November 06, 1960, 60 y.o.   MRN: [id number]  HPI I connected with DERRIUS RUDESILL DOB 1960/09/06 on 03/30/2021 by a video enabled telemedicine application and verified that I am speaking with the correct person using two identifiers.   Patient is at his home and I am at my office.  He is agreeable to visit in this format today.  Patient messaged [place] today saying he had tested positive with a home test for COVID-19.  It has been going around his office recently.  No fever, respiratory congestion which is mild and a headache.  Symptoms started yesterday afternoon.  Patient has been isolating at home.  His wife is in another part of the house.  He knows to stay well-hydrated and to quarantine for minimum of 5 days.  Patient has history of glucose intolerance and mixed hyperlipidemia as well as hypertension.  He is seen virtually and looks a bit pale and fatigued.  Does not appear to be short of breath.  Able to give a clear concise history.     Review of Systems see above-has mild respiratory congestion     Objective:   Physical Exam  Blood pressure 137/78 pulse 86 temperature currently 98.8 degrees.  Has been taking Tylenol for headache and fever.      Assessment & Plan:  Acute COVID-19 virus infection-our records indicate he has had 3 COVID vaccines the last 1 having been given in November 2021.  Impaired glucose tolerance-seen by Dr. Dwyane Dee and stable  Essential hypertension-under treatment and stable  Hypertriglyceridemia-treated with Tricor and stable  GE reflux treated with Protonix 40 mg daily  Plan: He will isolate at home for minimum of 5 days.  He will stay well-hydrated and walk some to keep lungs open.  He will monitor pulse oximetry.  He will monitor his Accu-Cheks carefully and let me know if not doing well with glucose intolerance.  He will be treated with Paxlovid Renal dose due to GFR of 63.75 based on labs from July 11.   Have prescribed Hycodan 1 teaspoon every 8 hours as needed for cough and headache.  Call if symptoms worsen or not improving.

## 2021-03-30 NOTE — Telephone Encounter (Signed)
Scheduled virtual visit °

## 2021-04-02 ENCOUNTER — Other Ambulatory Visit: Payer: Self-pay | Admitting: Internal Medicine

## 2021-04-04 NOTE — Patient Instructions (Signed)
Quarantine at home a minimum of 5 days and take Paxlovid as directed. Stay well hydrated. Walk some and monitor pulse oximetry.  Call if symptoms worsen or not improving.  Monitor Accu-Cheks

## 2021-04-17 NOTE — Telephone Encounter (Signed)
Scheduled

## 2021-04-18 ENCOUNTER — Other Ambulatory Visit: Payer: Self-pay | Admitting: Endocrinology

## 2021-04-19 ENCOUNTER — Other Ambulatory Visit: Payer: Self-pay

## 2021-04-19 ENCOUNTER — Ambulatory Visit: Payer: Managed Care, Other (non HMO) | Admitting: Internal Medicine

## 2021-04-19 ENCOUNTER — Encounter: Payer: Self-pay | Admitting: Internal Medicine

## 2021-04-19 VITALS — BP 140/80 | HR 86 | Ht 74.0 in | Wt 258.0 lb

## 2021-04-19 DIAGNOSIS — I10 Essential (primary) hypertension: Secondary | ICD-10-CM

## 2021-04-19 MED ORDER — LOSARTAN POTASSIUM 25 MG PO TABS: 25.0000 mg | ORAL_TABLET | Freq: Every day | ORAL | 1 refills | Status: DC

## 2021-04-19 NOTE — Progress Notes (Signed)
   Subjective:    Patient ID: Lance Beard, male    DOB: 12-31-1960, 60 y.o.   MRN: [id number]  HPI Patient sent MyChart message that his blood pressure the past couple of weeks even though he had started back on HCTZ.  At times it was recently in the 140s over 75-80.  He messaged me on August 23 and blood pressure was 154/79.  He had COVID-19 virus infection in early August but recovered without incident.  Work continues to be stressful.  Been taking metoprolol 25 mg daily in addition to HCTZ 12.5 mg daily.  Options are to increase HCTZ to 25 mg daily or add something else.  Some situational stress with his property that is ongoing  Review of Systems he recovered well from [place].  Work is a little bit less stressful.  Still has ongoing work with [employer]     Objective:   Physical Exam Blood pressure 140/80 pulse 86 regular pulse oximetry 97% weight 258 pounds BMI 33.13       Assessment & Plan:  Labile blood pressure-likely due to situational stress  BMI 33.13-encourage diet and exercise.  Needs to lose some weight.  Plan: We are going to add losartan 25 mg daily, continue HCTZ 12.5 mg daily and metoprolol 25 mg daily.  He will monitor his blood pressure but not too frequently.  He will let me know how things are going.

## 2021-04-19 NOTE — Patient Instructions (Signed)
Add Losartan 25 mg daily to HCTZ and metoprolol. Give this new med a trial of about 10 days and let me know if still fluctuating.

## 2021-05-08 ENCOUNTER — Other Ambulatory Visit: Payer: Self-pay | Admitting: Internal Medicine

## 2021-05-08 ENCOUNTER — Other Ambulatory Visit: Payer: Self-pay

## 2021-05-08 MED ORDER — LOSARTAN POTASSIUM 25 MG PO TABS
25.0000 mg | ORAL_TABLET | Freq: Every day | ORAL | 1 refills | Status: DC
Start: 2021-05-08 — End: 2021-07-24

## 2021-05-09 ENCOUNTER — Other Ambulatory Visit: Payer: Self-pay | Admitting: Internal Medicine

## 2021-05-09 MED ORDER — HYDROCHLOROTHIAZIDE 12.5 MG PO TABS: 12.5000 mg | ORAL_TABLET | Freq: Every day | ORAL | 3 refills | Status: DC

## 2021-06-15 ENCOUNTER — Other Ambulatory Visit: Payer: Self-pay | Admitting: Internal Medicine

## 2021-07-05 LAB — HM DIABETES EYE EXAM

## 2021-07-09 ENCOUNTER — Encounter: Payer: Self-pay | Admitting: Internal Medicine

## 2021-07-15 ENCOUNTER — Other Ambulatory Visit: Payer: Self-pay | Admitting: Endocrinology

## 2021-07-24 ENCOUNTER — Other Ambulatory Visit: Payer: Self-pay | Admitting: Internal Medicine

## 2021-07-25 ENCOUNTER — Encounter: Payer: Self-pay | Admitting: Internal Medicine

## 2021-07-29 ENCOUNTER — Other Ambulatory Visit: Payer: Self-pay | Admitting: Internal Medicine

## 2021-08-27 ENCOUNTER — Encounter: Payer: Self-pay | Admitting: Internal Medicine

## 2021-08-28 NOTE — Telephone Encounter (Signed)
Called and let patient know I had put referral in for cardiology.

## 2021-09-04 NOTE — Progress Notes (Signed)
Cardiology Office Note   Date:  09/05/2021   ID:  Lance Beard, DOB 05-29-1961, MRN [id number]  PCP:  Elby Showers, MD    No chief complaint on file.  Coronary artery calcification  Wt Readings from Last 3 Encounters:  09/05/21 267 lb 3.2 oz (121.2 kg)  04/19/21 258 lb (117 kg)  03/08/21 258 lb (117 kg)       History of Present Illness: Lance Beard is a 61 y.o. male with risk factors for CAD.  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   Records from May 2021 showed: "Chest pain and shortness of breath with exertion.  EKG is within normal limits today.   Family history of coronary artery disease in his father   Hyperlipidemia-on statin medication-lipid panel was normal in December   Hypertension-treated and stable   BMI 35-once again encourage diet exercise and weight loss   Impaired glucose tolerance-hemoglobin A1c in December 2020 was normal at 6% on glipizide.  Does not tolerate Metformin."  2021 CTA coronaries showed: "Coronary calcium score of 78.2. This was 69th percentile for age and sex matched control.   2. Normal coronary origin with right dominance.   3. Mild calcified plaque in the proximal LAD causing minimal stenosis (0-24%)   4. CAD-RADS 1. Minimal non-obstructive CAD (0-24%). Consider non-atherosclerotic causes of chest pain. Consider preventive therapy and risk factor modification."  In the past few weeks, he has noticed that his HR is increased in general.  He ahs had several episodes when he wakes up early in the morning, that his HR is in the low 100s.  He can feel his heart beat but not any irregularity.      Walking has decreased.  He is trying to cut back on work.    Denies : Chest pain. Dizziness. Leg edema. Nitroglycerin use. Orthopnea.  Paroxysmal nocturnal dyspnea. Shortness of breath. Syncope.    Past Medical History:  Diagnosis Date   Chronic kidney disease    kidney stones  x2   Diabetes mellitus without complication ([place])    Hx of cardiovascular stress test    a. ETT-MV 3/14:  No scar or ischemia, EF 62%   Hyperlipidemia    Hypertension    PONV (postoperative nausea and vomiting)    with one surgery   Sleep apnea    CPAP    Past Surgical History:  Procedure Laterality Date   APPENDECTOMY     COLONOSCOPY     NASAL SEPTUM SURGERY     VARICOCELE EXCISION       Current Outpatient Medications  Medication Sig Dispense Refill   ALPRAZolam (XANAX) 0.25 MG tablet TAKE 1 TABLET BY MOUTH 2 TIMES DAILY AS NEEDED FOR ANXIETY. 30 tablet 3   aspirin 81 MG tablet Take 81 mg by mouth daily.     B Complex-C (B-COMPLEX WITH VITAMIN C) tablet Take 1 tablet by mouth daily.     Blood Glucose Monitoring Suppl (ONE TOUCH ULTRA 2) w/Device KIT SMARTSIG:1 Via Meter Every Night     fenofibrate (TRICOR) 145 MG tablet TAKE 1 TABLET BY MOUTH EVERY DAY 30 tablet 5   glucose blood (ONETOUCH ULTRA) test strip Check glucose twice a day 100 strip prn   hydrochlorothiazide (HYDRODIURIL) 12.5 MG tablet Take 1 tablet (12.5 mg total) by mouth daily. 90 tablet 3   losartan (COZAAR) 25 MG tablet TAKE 1 TABLET (25 MG TOTAL) BY MOUTH DAILY. [place]  tablet 1   metoprolol tartrate (LOPRESSOR) 25 MG tablet TAKE 1 TABLET BY MOUTH EVERY DAY IN THE MORNING 90 tablet 1   nitroGLYCERIN (NITROSTAT) 0.4 MG SL tablet Place 1 tablet (0.4 mg total) under the tongue every 5 (five) minutes as needed for chest pain. 50 tablet 0   nystatin-triamcinolone (MYCOLOG II) cream Apply to scrotum twice a day. 30 g 0   OneTouch Delica Lancets 24O MISC Check glucose twice a day 100 each prn   pantoprazole (PROTONIX) 40 MG tablet TAKE 1 TABLET BY MOUTH EVERY DAY 90 tablet 3   RYBELSUS 14 MG TABS TAKE 1 TABLET BY MOUTH EVERY DAY 30 tablet 2   No current facility-administered medications for this visit.    Allergies:   Patient has no known allergies.    Social History:  The patient  reports that he has never smoked.  He has never used smokeless tobacco. He reports that he does not drink alcohol and does not use drugs.   Family History:  The patient's family history includes CAD in his father; Diabetes in his father; Hypertension in his father; Kidney disease in his father; Lung cancer in his father.    ROS:  Please see the history of present illness.   Otherwise, review of systems are positive for palpitations, less exercise.   All other systems are reviewed and negative.    PHYSICAL EXAM: VS:  BP 126/74    Pulse 79    Ht _0  (1.88 m)    Wt 267 lb 3.2 oz (121.2 kg)    SpO2 97%    BMI 34.31 kg/m  , BMI Body mass index is 34.31 kg/m. GEN: Well nourished, well developed, in no acute distress HEENT: normal Neck: no JVD, carotid bruits, or masses Cardiac: RRR; no murmurs, rubs, or gallops,no edema  Respiratory:  clear to auscultation bilaterally, normal work of breathing GI: soft, nontender, nondistended, + BS MS: no deformity or atrophy Skin: warm and dry, no rash Neuro:  Strength and sensation are intact Psych: euthymic mood, full affect   EKG:   The ekg ordered today demonstrates NSR, prolonged PR interval, no ST changes   Recent Labs: 02/15/2021: Hemoglobin 16.8; Platelets 282 03/05/2021: ALT 50; BUN 14; Creatinine, Ser 1.24; Potassium 4.1; Sodium 140   Lipid Panel    Component Value Date/Time   CHOL 127 03/05/2021 1104   TRIG 152.0 (H) 03/05/2021 1104   HDL 25.80 (L) 03/05/2021 1104   CHOLHDL 5 03/05/2021 1104   VLDL 30.4 03/05/2021 1104   LDLCALC 70 03/05/2021 1104   LDLCALC 44 10/05/2020 1138   LDLDIRECT 80.0 12/07/2020 1031     Other studies Reviewed: Additional studies/ records that were reviewed today with results demonstrating: LDL 70, TG 152. .   ASSESSMENT AND PLAN:  CAD: mild by CTA.  Coronary calcification present.  No angina on medical therapy.  We will be stressed the importance of preventive therapy.   Palpitations: Heart rate has been higher in general per his  Fitbit, but most noticeably higher early in the mornings when he wakes up.  We will plan for 2 weeks Zio patch. Hyperlipidemia: Whole food, plant-based diet recommended.  High-fiber intake. Type 2 diabetes: Avoid processed foods to avoid added sugars in his diet.  Exercise as noted to the target below.  Rybelsus made a big difference. A1C 5.1 in 02/2021.  Hypertension: Minimize salt in diet. Family history of heart disease: No change since last visit.   Current medicines are reviewed  at length with the patient today.  The patient concerns regarding his medicines were addressed.  The following changes have been made:  No change  Labs/ tests ordered today include:  No orders of the defined types were placed in this encounter.   Recommend 150 minutes/week of aerobic exercise Low fat, low carb, high fiber diet recommended  Disposition:   FU in 1 year or sooner if any significant arrhythmia noted on Zio patch.   SignedLarae Grooms, MD  09/05/2021 10:34 AM    [facility] [address], [place], [place]  [address] Phone: [phone]; Fax: [phone]

## 2021-09-05 ENCOUNTER — Encounter: Payer: Self-pay | Admitting: Interventional Cardiology

## 2021-09-05 ENCOUNTER — Ambulatory Visit (INDEPENDENT_AMBULATORY_CARE_PROVIDER_SITE_OTHER): Payer: Managed Care, Other (non HMO)

## 2021-09-05 ENCOUNTER — Other Ambulatory Visit: Payer: Self-pay

## 2021-09-05 ENCOUNTER — Ambulatory Visit: Payer: Managed Care, Other (non HMO) | Admitting: Interventional Cardiology

## 2021-09-05 VITALS — BP 126/74 | HR 79 | Ht 74.0 in | Wt 267.2 lb

## 2021-09-05 DIAGNOSIS — E1159 Type 2 diabetes mellitus with other circulatory complications: Secondary | ICD-10-CM | POA: Diagnosis not present

## 2021-09-05 DIAGNOSIS — I2510 Atherosclerotic heart disease of native coronary artery without angina pectoris: Secondary | ICD-10-CM | POA: Diagnosis not present

## 2021-09-05 DIAGNOSIS — I10 Essential (primary) hypertension: Secondary | ICD-10-CM | POA: Diagnosis not present

## 2021-09-05 DIAGNOSIS — R002 Palpitations: Secondary | ICD-10-CM | POA: Diagnosis not present

## 2021-09-05 DIAGNOSIS — E782 Mixed hyperlipidemia: Secondary | ICD-10-CM

## 2021-09-05 DIAGNOSIS — I2584 Coronary atherosclerosis due to calcified coronary lesion: Secondary | ICD-10-CM

## 2021-09-05 NOTE — Progress Notes (Unsigned)
Enrolled patient for a 14 day Zio XT  monitor to be mailed to patients home  °

## 2021-09-05 NOTE — Patient Instructions (Signed)
Medication Instructions:  The current medical regimen is effective;  continue present plan and medications.  *If you need a refill on your cardiac medications before your next appointment, please call your pharmacy*  Testing/Procedures: [place] Monitor Instructions  Your physician has requested you wear a ZIO patch monitor for 14 days.  This is a single patch monitor. [employer] supplies one patch monitor per enrollment. Additional stickers are not available. Please do not apply patch if you will be having a Nuclear Stress Test,  Echocardiogram, Cardiac CT, MRI, or Chest Xray during the period you would be wearing the  monitor. The patch cannot be worn during these tests. You cannot remove and re-apply the  ZIO XT patch monitor.  Your ZIO patch monitor will be mailed 3 day USPS to your address on file. It may take 3-5 days  to receive your monitor after you have been enrolled.  Once you have received your monitor, please review the enclosed instructions. Your monitor  has already been registered assigning a specific monitor serial # to you.  Billing and Patient Assistance Program Information  We have supplied [employer] with any of your insurance information on file for billing purposes. [employer] offers a sliding scale Patient Assistance Program for patients that do not have  insurance, or whose insurance does not completely cover the cost of the ZIO monitor.  You must apply for the Patient Assistance Program to qualify for this discounted rate.  To apply, please call [employer] at [phone], select option 4, select option 2, ask to apply for  Patient Assistance Program. Theodore Demark will ask your household income, and how many people  are in your household. They will quote your out-of-pocket cost based on that information.  [employer] will also be able to set up a 61-month, interest-free payment plan if needed.  Applying the monitor   Shave hair from upper left chest.  Hold abrader disc  by orange tab. Rub abrader in 40 strokes over the upper left chest as  indicated in your monitor instructions.  Clean area with 4 enclosed alcohol pads. Let dry.  Apply patch as indicated in monitor instructions. Patch will be placed under collarbone on left  side of chest with arrow pointing upward.  Rub patch adhesive wings for 2 minutes. Remove white label marked "1". Remove the white  label marked "2". Rub patch adhesive wings for 2 additional minutes.  While looking in a mirror, press and release button in center of patch. A small green light will  flash 3-4 times. This will be your only indicator that the monitor has been turned on.  Do not shower for the first 24 hours. You may shower after the first 24 hours.  Press the button if you feel a symptom. You will hear a small click. Record Date, Time and  Symptom in the Patient Logbook.  When you are ready to remove the patch, follow instructions on the last 2 pages of Patient  Logbook. Stick patch monitor onto the last page of Patient Logbook.  Place Patient Logbook in the blue and white box. Use locking tab on box and tape box closed  securely. The blue and white box has prepaid postage on it. Please place it in the mailbox as  soon as possible. Your physician should have your test results approximately 7 days after the  monitor has been mailed back to [facility].  Call [facility] at [phone] if you have questions regarding  your ZIO XT patch  monitor. Call them immediately if you see an orange light blinking on your  monitor.  If your monitor falls off in less than 4 days, contact our Monitor department at [phone].  If your monitor becomes loose or falls off after 4 days call [employer] at [phone] for  suggestions on securing your monitor  Follow-Up: At [facility], you and your health needs are our priority.  As part of our continuing mission to provide you with exceptional heart care, we have  created designated Provider Care Teams.  These Care Teams include your primary Cardiologist (physician) and Advanced Practice Providers (APPs -  Physician Assistants and Nurse Practitioners) who all work together to provide you with the care you need, when you need it.  We recommend signing up for the patient portal called "MyChart".  Sign up information is provided on this After Visit Summary.  MyChart is used to connect with patients for Virtual Visits (Telemedicine).  Patients are able to view lab/test results, encounter notes, upcoming appointments, etc.  Non-urgent messages can be sent to your provider as well.   To learn more about what you can do with MyChart, go to [email or web address].    Your next appointment:   1 year(s)  The format for your next appointment:   In Person  Provider:   Larae Grooms, MD     Thank you for choosing [facility]!!

## 2021-09-08 DIAGNOSIS — R002 Palpitations: Secondary | ICD-10-CM

## 2021-09-11 ENCOUNTER — Other Ambulatory Visit: Payer: Self-pay | Admitting: Endocrinology

## 2021-09-11 ENCOUNTER — Other Ambulatory Visit: Payer: Self-pay

## 2021-09-11 ENCOUNTER — Other Ambulatory Visit (INDEPENDENT_AMBULATORY_CARE_PROVIDER_SITE_OTHER): Payer: Managed Care, Other (non HMO)

## 2021-09-11 DIAGNOSIS — E669 Obesity, unspecified: Secondary | ICD-10-CM

## 2021-09-11 DIAGNOSIS — E782 Mixed hyperlipidemia: Secondary | ICD-10-CM | POA: Diagnosis not present

## 2021-09-11 DIAGNOSIS — E1169 Type 2 diabetes mellitus with other specified complication: Secondary | ICD-10-CM | POA: Diagnosis not present

## 2021-09-11 LAB — COMPREHENSIVE METABOLIC PANEL
ALT: 62 U/L — ABNORMAL HIGH (ref 0–53)
AST: 32 U/L (ref 0–37)
Albumin: 4.3 g/dL (ref 3.5–5.2)
Alkaline Phosphatase: 14 U/L — ABNORMAL LOW (ref 39–117)
BUN: 19 mg/dL (ref 6–23)
CO2: 28 mEq/L (ref 19–32)
Calcium: 9.2 mg/dL (ref 8.4–10.5)
Chloride: 102 mEq/L (ref 96–112)
Creatinine, Ser: 1.34 mg/dL (ref 0.40–1.50)
GFR: 57.87 mL/min — ABNORMAL LOW (ref >60.00)
Glucose, Bld: 138 mg/dL — ABNORMAL HIGH (ref 70–99)
Potassium: 4.4 mEq/L (ref 3.5–5.1)
Sodium: 138 mEq/L (ref 135–145)
Total Bilirubin: 0.7 mg/dL (ref 0.2–1.2)
Total Protein: 6.9 g/dL (ref 6.0–8.3)

## 2021-09-11 LAB — LIPID PANEL
Cholesterol: 139 mg/dL (ref 0–200)
HDL: 25.4 mg/dL — ABNORMAL LOW (ref >39.00)
LDL Cholesterol: 80 mg/dL (ref 0–99)
NonHDL: 113.66
Total CHOL/HDL Ratio: 5
Triglycerides: 169 mg/dL — ABNORMAL HIGH (ref 0.0–149.0)
VLDL: 33.8 mg/dL (ref 0.0–40.0)

## 2021-09-11 LAB — HEMOGLOBIN A1C: Hgb A1c MFr Bld: 6.4 % (ref 4.6–6.5)

## 2021-09-13 ENCOUNTER — Ambulatory Visit: Payer: Managed Care, Other (non HMO) | Admitting: Endocrinology

## 2021-09-13 ENCOUNTER — Other Ambulatory Visit: Payer: Self-pay

## 2021-09-13 ENCOUNTER — Encounter: Payer: Self-pay | Admitting: Endocrinology

## 2021-09-13 VITALS — BP 138/70 | HR 87 | Ht 74.0 in | Wt 267.4 lb

## 2021-09-13 DIAGNOSIS — E669 Obesity, unspecified: Secondary | ICD-10-CM

## 2021-09-13 DIAGNOSIS — R945 Abnormal results of liver function studies: Secondary | ICD-10-CM

## 2021-09-13 DIAGNOSIS — E782 Mixed hyperlipidemia: Secondary | ICD-10-CM

## 2021-09-13 DIAGNOSIS — E1169 Type 2 diabetes mellitus with other specified complication: Secondary | ICD-10-CM

## 2021-09-13 NOTE — Progress Notes (Signed)
Patient ID: Lance Beard, male   DOB: [birth year], 61 y.o.   MRN: [id number]           Referring physician: Tedra Senegal  Chief complaint: Follow-up of diabetes and high triglycerides  History of Present Illness:    HYPERLIPIDEMIA:  He was found to have hypertriglyceridemia around the year 2010 Initially was tried on TriCor but he felt that it was causing excessive sweating and he did not take it for long He was subsequently switched to atorvastatin  On review of his lipids as far back as 2012 indicate that he has not had hypercholesterolemia but mostly high triglyceride levels with low HDL  On his initial consultation his Lipitor was stopped and fenofibrate started  He has also seen the dietitian   His diet has been fair, generally still watching fats and carbohydrates However weight has gone up partly from inconsistent caloric control and lack of exercise  Triglycerides are about the same at 169 although previously better at 152 LDL is still controlled and below 100  Lab Results  Component Value Date   CHOL 139 09/11/2021   CHOL 127 03/05/2021   CHOL 132 12/07/2020   Lab Results  Component Value Date   HDL 25.40 (L) 09/11/2021   HDL 25.80 (L) 03/05/2021   HDL 26.70 (L) 12/07/2020   Lab Results  Component Value Date   LDLCALC 80 09/11/2021   LDLCALC 70 03/05/2021   LDLCALC 44 10/05/2020   Lab Results  Component Value Date   TRIG 169.0 (H) 09/11/2021   TRIG 152.0 (H) 03/05/2021   TRIG 212.0 (H) 12/07/2020   Lab Results  Component Value Date   CHOLHDL 5 09/11/2021   CHOLHDL 5 03/05/2021   CHOLHDL 5 12/07/2020   Lab Results  Component Value Date   LDLDIRECT 80.0 12/07/2020    Problem 2: DIABETES  Date of diagnosis of type 2 diabetes mellitus: 2020       Background history:   He had prediabetes with A1c 6% in 2019 which was unchanged until 07/2020 when it went up to 6.7. He apparently was prescribed glipizide ER 2.5 mg daily in 05/2019 which was  continued until probably 2/22 He tried Metformin in 6/20 but may not have tolerated this because of diarrhea Since his A1c had gone up in 12/21 he was given Januvia 100 mg daily  Recent history:   Most recent A1c is 5.1  Non-insulin hypoglycemic drugs the patient is taking are: Rybelsus 14 mg daily  Current management, blood sugar patterns and problems identified: He was last seen in 7/22  At that time he was continued on Rybelsus 14 mg He says he is taking this regularly as directed 30-minute before eating  Begin tolerating this well  Previously had more satiety with Rybelsus but he is not finding the same effect now  His weight is up about 9 pounds  Also because of a busy schedule he has not done his morning walking and no other exercise  Overall blood sugars are higher and his fasting readings more recently have been about 140 or higher However his blood sugars do not appear to be higher after meals in the evening       Side effects from medications have been: Possible GI intolerance to Metformin     Typical meal intake: Breakfast is usually a bagel               Glucose monitoring:  done <1 times a day  Glucometer: One Touch .       Blood Glucose readings by time of day and averages from meter download:   PRE-MEAL Fasting Lunch Dinner Bedtime Overall  Glucose range: 110-152  82-111    Mean/median: 136    125   POST-MEAL PC Breakfast PC Lunch PC Dinner  Glucose range:   92-166  Mean/median:   129    PREVIOUS AVERAGE 103 with range 71-133  Has morning average about 105 and late evening 107   Dietician visit, most recent: 11/2020  Wt Readings from Last 3 Encounters:  09/13/21 267 lb 6.4 oz (121.3 kg)  09/05/21 267 lb 3.2 oz (121.2 kg)  04/19/21 258 lb (117 kg)     Lab Results  Component Value Date   HGBA1C 6.4 09/11/2021   HGBA1C 5.1 03/05/2021   HGBA1C 5.3 02/15/2021   Lab Results  Component Value Date   MICROALBUR 0.7 08/17/2020   [place] 80  09/11/2021   CREATININE 1.34 09/11/2021    Past Medical History:  Diagnosis Date   Chronic kidney disease    kidney stones x2   Diabetes mellitus without complication ([place])    Hx of cardiovascular stress test    a. ETT-MV 3/14:  No scar or ischemia, EF 62%   Hyperlipidemia    Hypertension    PONV (postoperative nausea and vomiting)    with one surgery   Sleep apnea    CPAP    Past Surgical History:  Procedure Laterality Date   APPENDECTOMY     COLONOSCOPY     NASAL SEPTUM SURGERY     VARICOCELE EXCISION      Family History  Problem Relation Age of Onset   Diabetes Father    Hypertension Father    CAD Father        Approximate age 39    Lung cancer Father    Kidney disease Father    Colon cancer Neg Hx    Esophageal cancer Neg Hx    Stomach cancer Neg Hx    Rectal cancer Neg Hx     Social History:  reports that he has never smoked. He has never used smokeless tobacco. He reports that he does not drink alcohol and does not use drugs.  Allergies: No Known Allergies  Allergies as of 09/13/2021   No Known Allergies      Medication List        Accurate as of September 13, 2021  4:38 PM. If you have any questions, ask your nurse or doctor.          ALPRAZolam 0.25 MG tablet Commonly known as: XANAX TAKE 1 TABLET BY MOUTH 2 TIMES DAILY AS NEEDED FOR ANXIETY.   aspirin 81 MG tablet Take 81 mg by mouth daily.   B-complex with vitamin C tablet Take 1 tablet by mouth daily.   fenofibrate 145 MG tablet Commonly known as: TRICOR TAKE 1 TABLET BY MOUTH EVERY DAY   hydrochlorothiazide 12.5 MG tablet Commonly known as: HYDRODIURIL Take 1 tablet (12.5 mg total) by mouth daily.   losartan 25 MG tablet Commonly known as: COZAAR TAKE 1 TABLET (25 MG TOTAL) BY MOUTH DAILY.   metoprolol tartrate 25 MG tablet Commonly known as: LOPRESSOR TAKE 1 TABLET BY MOUTH EVERY DAY IN THE MORNING   nitroGLYCERIN 0.4 MG SL tablet Commonly known as: NITROSTAT Place 1  tablet (0.4 mg total) under the tongue every 5 (five) minutes as needed for chest pain.   nystatin-triamcinolone cream Commonly known  as: MYCOLOG II Apply to scrotum twice a day.   ONE TOUCH ULTRA 2 w/Device Kit SMARTSIG:1 Via Meter Every Night   OneTouch Delica Lancets 93Y Misc Check glucose twice a day   OneTouch Ultra test strip Generic drug: glucose blood Check glucose twice a day   pantoprazole 40 MG tablet Commonly known as: PROTONIX TAKE 1 TABLET BY MOUTH EVERY DAY   Rybelsus 14 MG Tabs Generic drug: Semaglutide TAKE 1 TABLET BY MOUTH EVERY DAY        LABS:  Lab on 09/11/2021  Component Date Value Ref Range Status   Cholesterol 09/11/2021 139  0 - 200 mg/dL Final   ATP III Classification       Desirable:  < 200 mg/dL               Borderline High:  200 - 239 mg/dL          High:  > = 240 mg/dL   Triglycerides 09/11/2021 169.0 (H)  0.0 - 149.0 mg/dL Final   Normal:  <150 mg/dLBorderline High:  150 - 199 mg/dL   HDL 09/11/2021 25.40 (L)  >39.00 mg/dL Final   VLDL 09/11/2021 33.8  0.0 - 40.0 mg/dL Final   LDL Cholesterol 09/11/2021 80  0 - 99 mg/dL Final   Total CHOL/HDL Ratio 09/11/2021 5   Final                  Men          Women1/2 Average Risk     3.4          3.3Average Risk          5.0          4.42X Average Risk          9.6          7.13X Average Risk          15.0          11.0                       NonHDL 09/11/2021 113.66   Final   NOTE:  Non-HDL goal should be 30 mg/dL higher than patient's LDL goal (i.e. LDL goal of < 70 mg/dL, would have non-HDL goal of < 100 mg/dL)   Sodium 09/11/2021 138  135 - 145 mEq/L Final   Potassium 09/11/2021 4.4  3.5 - 5.1 mEq/L Final   Chloride 09/11/2021 102  96 - 112 mEq/L Final   CO2 09/11/2021 28  19 - 32 mEq/L Final   Glucose, Bld 09/11/2021 138 (H)  70 - 99 mg/dL Final   BUN 09/11/2021 19  6 - 23 mg/dL Final   Creatinine, Ser 09/11/2021 1.34  0.40 - 1.50 mg/dL Final   Total Bilirubin 09/11/2021 0.7  0.2 - 1.2  mg/dL Final   Alkaline Phosphatase 09/11/2021 14 (L)  39 - 117 U/L Final   AST 09/11/2021 32  0 - 37 U/L Final   ALT 09/11/2021 62 (H)  0 - 53 U/L Final   Total Protein 09/11/2021 6.9  6.0 - 8.3 g/dL Final   Albumin 09/11/2021 4.3  3.5 - 5.2 g/dL Final   GFR 09/11/2021 57.87 (L)  >60.00 mL/min Final   Calculated using the CKD-EPI Creatinine Equation (2021)   Calcium 09/11/2021 9.2  8.4 - 10.5 mg/dL Final   Hgb A1c MFr Bld 09/11/2021 6.4  4.6 - 6.5 % Final   Glycemic Control Guidelines  for People with Diabetes:Non Diabetic:  <6%Goal of Therapy: <7%Additional Action Suggested:  >8%         Review of Systems  Last eye exam was 06/2020 with no retinopathy  Hypertension: Has been following up with his PCP for this  BP Readings from Last 3 Encounters:  09/13/21 138/70  09/05/21 126/74  04/19/21 140/80   Lab Results  Component Value Date   CREATININE 1.34 09/11/2021   CREATININE 1.24 03/05/2021   CREATININE 1.24 02/19/2021   Coronary calcium score is 69th percentile However his CT angiogram did not show any lesions or plaque  Recently having some feeling of palpitations but only in the morning on waking up, followed by cardiologist  He does have a high ALT now  Lab Results  Component Value Date   ALT 62 (H) 09/11/2021   ALT 50 03/05/2021   ALT 29 02/19/2021     PHYSICAL EXAM:  BP 138/70    Pulse 87    Ht 6' 2"  (1.88 m)    Wt 267 lb 6.4 oz (121.3 kg)    SpO2 98%    BMI 34.33 kg/m      ASSESSMENT:   DIABETES with obesity:  Treated with 14 mg Rybelsus  His A1c is relatively higher at 6.4 His blood sugars have increased especially in the mornings lately Previously intolerant to metformin apparently  He thinks he has not been able to be consistent with his exercise regimen or diet lately and this has resulted in weight gain  HYPERTRIGLYCERIDEMIA and no history of increased LDL. Triglycerides are still under 200 with fenofibrate Has also a good LDL level  without a statin drug  Palpitations: This is mostly noticeable in the mornings and not at any other time and he has no other symptoms like weight loss suggestive of hyperthyroidism   PLAN:   He feels that he will be able to get back into his exercise and plan his meals better when he goes part-time with his work next month Discussed needing to continue working on weight loss This will also help his triglycerides  Increased ALT: Possibly from fatty liver and will continue to follow    Elayne Snare 09/13/2021, 4:38 PM

## 2021-09-25 ENCOUNTER — Other Ambulatory Visit: Payer: Self-pay

## 2021-09-25 ENCOUNTER — Ambulatory Visit (AMBULATORY_SURGERY_CENTER): Payer: Managed Care, Other (non HMO)

## 2021-09-25 VITALS — Ht 74.0 in | Wt 260.0 lb

## 2021-09-25 DIAGNOSIS — Z86010 Personal history of colonic polyps: Secondary | ICD-10-CM

## 2021-09-25 MED ORDER — NA SULFATE-K SULFATE-MG SULF 17.5-3.13-1.6 GM/177ML PO SOLN: 1.0000 | Freq: Once | ORAL | 0 refills | Status: AC

## 2021-09-25 NOTE — Progress Notes (Signed)
No egg or soy allergy known to patient  No issues known to pt with past sedation with any surgeries or procedures Patient denies ever being told they had issues or difficulty with intubation  No FH of Malignant Hyperthermia Pt is not on diet pills Pt is not on home 02  Pt is not on blood thinners  Pt denies issues with constipation;  No A fib or A flutter Pt is fully vaccinated for Covid x 2; NO PA's for preps discussed with pt in PV today  Discussed with pt there will be an out-of-pocket cost for prep and that varies from $0 to 70 + dollars - pt verbalized understanding  Due to the COVID-19 pandemic we are asking patients to follow certain guidelines in PV and the [place]   Pt aware of COVID protocols and [facility] guidelines  PV completed over the phone. Pt verified name, DOB, address and insurance during PV today.  Pt mailed instruction packet with copy of consent form to read and not return, and instructions.  Pt encouraged to call with questions or issues.  If pt has My chart, procedure instructions sent via My Chart   Zio patch sent in on 09/23/2021 - no results at this time from the cardiologist per the patient-  Patient advised to make sure the cardiologist sends the office the results of the Zio patch as the office needs those results- patient reports he is unsure if the results will be back in time prior to the procedure; notation placed in black book in [place] office for recheck for results

## 2021-10-02 ENCOUNTER — Encounter: Payer: Managed Care, Other (non HMO) | Admitting: Internal Medicine

## 2021-10-04 ENCOUNTER — Telehealth: Payer: Self-pay | Admitting: Interventional Cardiology

## 2021-10-04 NOTE — Telephone Encounter (Signed)
Left message to call back  

## 2021-10-04 NOTE — Telephone Encounter (Signed)
Patient returning call to discuss monitor results

## 2021-10-04 NOTE — Telephone Encounter (Signed)
Jettie Booze, MD  10/03/2021  3:34 PM EST     No significant arrhythmias.     The patient has been notified of the result and verbalized understanding.  All questions (if any) were answered. Nuala Alpha, LPN 10/02/6145 09:29 PM

## 2021-10-04 NOTE — Telephone Encounter (Signed)
Patient returning call from Triage Nurse

## 2021-10-15 ENCOUNTER — Encounter: Payer: Self-pay | Admitting: Internal Medicine

## 2021-10-16 ENCOUNTER — Other Ambulatory Visit: Payer: Self-pay | Admitting: Endocrinology

## 2021-10-17 ENCOUNTER — Ambulatory Visit (AMBULATORY_SURGERY_CENTER): Payer: Managed Care, Other (non HMO) | Admitting: Internal Medicine

## 2021-10-17 ENCOUNTER — Other Ambulatory Visit: Payer: Self-pay

## 2021-10-17 ENCOUNTER — Encounter: Payer: Self-pay | Admitting: Internal Medicine

## 2021-10-17 VITALS — BP 114/68 | HR 75 | Temp 98.4°F | Resp 15 | Ht 74.0 in | Wt 260.0 lb

## 2021-10-17 DIAGNOSIS — Z86010 Personal history of colonic polyps: Secondary | ICD-10-CM

## 2021-10-17 DIAGNOSIS — D125 Benign neoplasm of sigmoid colon: Secondary | ICD-10-CM

## 2021-10-17 DIAGNOSIS — D122 Benign neoplasm of ascending colon: Secondary | ICD-10-CM

## 2021-10-17 MED ORDER — SODIUM CHLORIDE 0.9 % IV SOLN: 500.0000 mL | Freq: Once | INTRAVENOUS | Status: DC

## 2021-10-17 NOTE — Progress Notes (Signed)
HISTORY OF PRESENT ILLNESS:  Lance Beard is a 61 y.o. male who presents today regarding surveillance colonoscopy.  Previous colonoscopy examinations 2016 and 2019.  He is known to have multiple adenomas, large adenoma, and SSP.  No active complaints  REVIEW OF SYSTEMS:  All non-GI ROS negative. Past Medical History:  Diagnosis Date   Chronic kidney disease    kidney stones x2   Diabetes mellitus without complication ([place])    on meds   GERD (gastroesophageal reflux disease)    on meds   Hx of cardiovascular stress test    a. ETT-MV 3/14:  No scar or ischemia, EF 62%   Hyperlipidemia    on meds   Hypertension    on meds   PONV (postoperative nausea and vomiting)    with one surgery   Sleep apnea    uses CPAP    Past Surgical History:  Procedure Laterality Date   APPENDECTOMY     COLONOSCOPY  2019   JP-MAC-suprep(exc)-4 polyps-TA and sessile   NASAL SEPTUM SURGERY     VARICOCELE EXCISION     WISDOM TOOTH EXTRACTION      Social History Lance Beard  reports that he has never smoked. He has never used smokeless tobacco. He reports that he does not drink alcohol and does not use drugs.  family history includes CAD in his father; Diabetes in his father; Hypertension in his father; Kidney disease in his father; Lung cancer in his father.  No Known Allergies     PHYSICAL EXAMINATION:  Vital signs: There were no vitals taken for this visit. General: Well-developed, well-nourished, no acute distress HEENT: Sclerae are anicteric, conjunctiva pink. Oral mucosa intact Lungs: Clear Heart: Regular Abdomen: soft, nontender, nondistended, no obvious ascites, no peritoneal signs, normal bowel sounds. No organomegaly. Extremities: No edema Psychiatric: alert and oriented x3. Cooperative     ASSESSMENT:  1.  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  Due for surveillance colonoscopy   PLAN:  1.  Surveillance colonoscopy

## 2021-10-17 NOTE — Op Note (Signed)
[place] Patient Name: Lance Beard Procedure Date: 10/17/2021 1:23 PM MRN: [id number] Endoscopist: Docia Chuck. Henrene Pastor , MD Age: 61 Referring MD:  Date of Birth: 1961-06-25 Gender: Male Account #: [id number] Procedure:                Colonoscopy with cold snare polypectomy x 2; hot                            snare x 1 Indications:              High risk colon cancer surveillance: Personal                            history of adenoma (10 mm or greater in size), High                            risk colon cancer surveillance: Personal history of                            multiple (3 or more) adenomas, High risk colon                            cancer surveillance: Personal history of sessile                            serrated colon polyp (less than 10 mm in size) with                            no dysplasia Medicines:                Monitored Anesthesia Care Procedure:                Pre-Anesthesia Assessment:                           - Prior to the procedure, a History and Physical                            was performed, and patient medications and                            allergies were reviewed. The patient's tolerance of                            previous anesthesia was also reviewed. The risks                            and benefits of the procedure and the sedation                            options and risks were discussed with the patient.                            All questions were answered, and informed consent  was obtained. Prior Anticoagulants: The patient has                            taken no previous anticoagulant or antiplatelet                            agents. ASA Grade Assessment: II - A patient with                            mild systemic disease. After reviewing the risks                            and benefits, the patient was deemed in                            satisfactory condition to undergo the procedure.                            After obtaining informed consent, the colonoscope                            was passed under direct vision. Throughout the                            procedure, the patient's blood pressure, pulse, and                            oxygen saturations were monitored continuously. The                            CF [id number] #[id number] was introduced through the anus                            and advanced to the the cecum, identified by                            appendiceal orifice and ileocecal valve. The                            ileocecal valve, appendiceal orifice, and rectum                            were photographed. The quality of the bowel                            preparation was excellent. The colonoscopy was                            performed without difficulty. The patient tolerated                            the procedure well. The bowel preparation used was  SUPREP via split dose instruction. Scope In: 1:32:40 PM Scope Out: 1:50:49 PM Scope Withdrawal Time: 0 hours 15 minutes 0 seconds  Total Procedure Duration: 0 hours 18 minutes 9 seconds  Findings:                 A 10 mm polyp was found in the sigmoid colon. The                            polyp was pedunculated. The polyp was removed with                            a hot snare. Resection and retrieval were complete.                           Two polyps were found in the ascending colon. The                            polyps were 3 to 4 mm in size. These polyps were                            removed with a cold snare. Resection and retrieval                            were complete.                           Multiple diverticula were found in the left colon                            and right colon. Small internal hemorrhoids.                           The exam was otherwise without abnormality on                            direct and retroflexion views. Complications:             No immediate complications. Estimated blood loss:                            None. Estimated Blood Loss:     Estimated blood loss: none. Impression:               - One 10 mm polyp in the sigmoid colon, removed                            with a hot snare. Resected and retrieved.                           - Two 3 to 4 mm polyps in the ascending colon,                            removed with a cold snare. Resected and retrieved.                           -  Diverticulosis in the left colon and in the right                            colon.                           - The examination was otherwise normal on direct                            and retroflexion views. Recommendation:           - Repeat colonoscopy in 3 years for surveillance.                           - Patient has a contact number available for                            emergencies. The signs and symptoms of potential                            delayed complications were discussed with the                            patient. Return to normal activities tomorrow.                            Written discharge instructions were provided to the                            patient.                           - Resume previous diet.                           - Continue present medications.                           - Await pathology results. Docia Chuck. Henrene Pastor, MD 10/17/2021 2:06:27 PM This report has been signed electronically.

## 2021-10-17 NOTE — Progress Notes (Signed)
PT taken to PACU. Monitors in place. VSS. Report given to RN. 

## 2021-10-17 NOTE — Progress Notes (Signed)
Called to room to assist during endoscopic procedure.  Patient ID and intended procedure confirmed with present staff. Received instructions for my participation in the procedure from the performing physician.  

## 2021-10-17 NOTE — Patient Instructions (Addendum)
Handouts were given to your care partner on polyps and diverticulosis. Your sugar was 127 in the recovery room. You may resume your current medications today. Await biopsy results.  May take 1-3 weeks to receive pathology results. Please call if any questions or concerns.     YOU HAD AN ENDOSCOPIC PROCEDURE TODAY AT [facility]:   Refer to the procedure report that was given to you for any specific questions about what was found during the examination.  If the procedure report does not answer your questions, please call your gastroenterologist to clarify.  If you requested that your care partner not be given the details of your procedure findings, then the procedure report has been included in a sealed envelope for you to review at your convenience later.  YOU SHOULD EXPECT: Some feelings of bloating in the abdomen. Passage of more gas than usual.  Walking can help get rid of the air that was put into your GI tract during the procedure and reduce the bloating. If you had a lower endoscopy (such as a colonoscopy or flexible sigmoidoscopy) you may notice spotting of blood in your stool or on the toilet paper. If you underwent a bowel prep for your procedure, you may not have a normal bowel movement for a few days.  Please Note:  You might notice some irritation and congestion in your nose or some drainage.  This is from the oxygen used during your procedure.  There is no need for concern and it should clear up in a day or so.  SYMPTOMS TO REPORT IMMEDIATELY:  Following lower endoscopy (colonoscopy or flexible sigmoidoscopy):  Excessive amounts of blood in the stool  Significant tenderness or worsening of abdominal pains  Swelling of the abdomen that is new, acute  Fever of 100F or higher  For urgent or emergent issues, a gastroenterologist can be reached at any hour by calling [phone]. Do not use MyChart messaging for urgent concerns.    DIET:  We do recommend a small  meal at first, but then you may proceed to your regular diet.  Drink plenty of fluids but you should avoid alcoholic beverages for 24 hours.  ACTIVITY:  You should plan to take it easy for the rest of today and you should NOT DRIVE or use heavy machinery until tomorrow (because of the sedation medicines used during the test).    FOLLOW UP: Our staff will call the number listed on your records 48-72 hours following your procedure to check on you and address any questions or concerns that you may have regarding the information given to you following your procedure. If we do not reach you, we will leave a message.  We will attempt to reach you two times.  During this call, we will ask if you have developed any symptoms of COVID 19. If you develop any symptoms (ie: fever, flu-like symptoms, shortness of breath, cough etc.) before then, please call [phone].  If you test positive for Covid 19 in the 2 weeks post procedure, please call and report this information to [place].    If any biopsies were taken you will be contacted by phone or by letter within the next 1-3 weeks.  Please call us at [phone] if you have not heard about the biopsies in 3 weeks.    SIGNATURES/CONFIDENTIALITY: You and/or your care partner have signed paperwork which will be entered into your electronic medical record.  These signatures attest to the fact that that the  information above on your After Visit Summary has been reviewed and is understood.  Full responsibility of the confidentiality of this discharge information lies with you and/or your care-partner.

## 2021-10-17 NOTE — Progress Notes (Signed)
VS by DT  Pt's states no medical or surgical changes since previsit or office visit.  

## 2021-10-17 NOTE — Progress Notes (Signed)
No problems noted in the recovery room. maw   Per Dr. Henrene Pastor, okay for pt to continue aspirin 81 mg.  maw

## 2021-10-19 ENCOUNTER — Telehealth: Payer: Self-pay

## 2021-10-19 ENCOUNTER — Telehealth: Payer: Self-pay | Admitting: *Deleted

## 2021-10-19 NOTE — Telephone Encounter (Signed)
Attempted f/u call. No answer, left VM. 

## 2021-10-19 NOTE — Telephone Encounter (Signed)
No answer on second attempt follow up call.

## 2021-10-22 ENCOUNTER — Encounter: Payer: Self-pay | Admitting: Internal Medicine

## 2021-11-05 ENCOUNTER — Other Ambulatory Visit: Payer: Self-pay | Admitting: Endocrinology

## 2021-11-13 ENCOUNTER — Ambulatory Visit: Payer: Managed Care, Other (non HMO) | Admitting: Dermatology

## 2021-11-13 ENCOUNTER — Other Ambulatory Visit: Payer: Self-pay

## 2021-11-13 ENCOUNTER — Encounter: Payer: Self-pay | Admitting: Dermatology

## 2021-11-13 DIAGNOSIS — L821 Other seborrheic keratosis: Secondary | ICD-10-CM

## 2021-11-13 DIAGNOSIS — L309 Dermatitis, unspecified: Secondary | ICD-10-CM

## 2021-11-13 DIAGNOSIS — B369 Superficial mycosis, unspecified: Secondary | ICD-10-CM

## 2021-11-13 DIAGNOSIS — Z1283 Encounter for screening for malignant neoplasm of skin: Secondary | ICD-10-CM | POA: Diagnosis not present

## 2021-11-13 DIAGNOSIS — B354 Tinea corporis: Secondary | ICD-10-CM

## 2021-11-13 LAB — POCT SKIN KOH

## 2021-11-13 MED ORDER — CLOBETASOL PROPIONATE 0.05 % EX CREA: 1.0000 "application " | TOPICAL_CREAM | Freq: Two times a day (BID) | CUTANEOUS | 0 refills | Status: DC

## 2021-11-14 ENCOUNTER — Ambulatory Visit: Payer: Managed Care, Other (non HMO) | Admitting: Physician Assistant

## 2021-11-30 ENCOUNTER — Encounter: Payer: Self-pay | Admitting: Dermatology

## 2021-11-30 NOTE — Progress Notes (Signed)
? ?  New Patient ?  ?Subjective  ?Lance Beard is a 61 y.o. male who presents for the following: Annual Exam (Right and left calf itchy lesions tx tac/nystatin combo cream from pcp and right forehead lesion getting larger and raised to touch x months no bleeding ). ? ?General skin examination, rash on legs. ?Location:  ?Duration:  ?Quality:  ?Associated Signs/Symptoms: ?Modifying Factors:  ?Severity:  ?Timing: ?Context:  ? ? ?The following portions of the chart were reviewed this encounter and updated as appropriate:  Tobacco  Allergies  Meds  Problems  Med Hx  Surg Hx  Fam Hx   ?  ? ?Objective  ?Well appearing patient in no apparent distress; mood and affect are within normal limits. ?No atypical nevi or signs of NMSC noted at the time of the visit.  ? ?Left Lower Leg - Posterior ?Previous tx tac/nystatin cream from pcp.  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  No active tinea feet.  KOH negative from left calf. ? ? ? ?A full examination was performed including scalp, head, eyes, ears, nose, lips, neck, chest, axillae, abdomen, back, buttocks, bilateral upper extremities, bilateral lower extremities, hands, feet, fingers, toes, fingernails, and toenails. All findings within normal limits unless otherwise noted below.  Areas beneath undergarments not fully examined. ? ? ?Assessment & Plan  ?Screening exam for skin cancer ? ?Annual skin examination. ? ?Dermatitis fungal ? ?Related Procedures ?POCT Skin KOH ? ?Dermatitis ?Left Lower Leg - Posterior ? ?Clobetasol cream daily after bathing for 4 to 6 weeks.  Add 20-minute moist wrap on top of topical cream if not seeing improvement within 2 weeks. ? ?POCT Skin KOH - Left Lower Leg - Posterior ? ?clobetasol cream (TEMOVATE) 0.05 % - Left Lower Leg - Posterior ?Apply 1 application. topically 2 (two) times daily. ? ? ?

## 2021-12-05 ENCOUNTER — Other Ambulatory Visit: Payer: Self-pay | Admitting: Internal Medicine

## 2021-12-10 ENCOUNTER — Other Ambulatory Visit: Payer: Self-pay | Admitting: Dermatology

## 2021-12-10 ENCOUNTER — Other Ambulatory Visit: Payer: Self-pay | Admitting: Endocrinology

## 2021-12-10 ENCOUNTER — Encounter: Payer: Self-pay | Admitting: Internal Medicine

## 2021-12-10 DIAGNOSIS — L309 Dermatitis, unspecified: Secondary | ICD-10-CM

## 2021-12-11 ENCOUNTER — Other Ambulatory Visit (INDEPENDENT_AMBULATORY_CARE_PROVIDER_SITE_OTHER): Payer: Managed Care, Other (non HMO)

## 2021-12-11 DIAGNOSIS — E669 Obesity, unspecified: Secondary | ICD-10-CM | POA: Diagnosis not present

## 2021-12-11 DIAGNOSIS — E1169 Type 2 diabetes mellitus with other specified complication: Secondary | ICD-10-CM

## 2021-12-11 DIAGNOSIS — E782 Mixed hyperlipidemia: Secondary | ICD-10-CM | POA: Diagnosis not present

## 2021-12-11 LAB — LIPID PANEL
Cholesterol: 146 mg/dL (ref 0–200)
HDL: 26.3 mg/dL — ABNORMAL LOW (ref >39.00)
LDL Cholesterol: 80 mg/dL (ref 0–99)
NonHDL: 119.79
Total CHOL/HDL Ratio: 6
Triglycerides: 200 mg/dL — ABNORMAL HIGH (ref 0.0–149.0)
VLDL: 40 mg/dL (ref 0.0–40.0)

## 2021-12-11 LAB — COMPREHENSIVE METABOLIC PANEL
ALT: 53 U/L (ref 0–53)
AST: 29 U/L (ref 0–37)
Albumin: 4.4 g/dL (ref 3.5–5.2)
Alkaline Phosphatase: 20 U/L — ABNORMAL LOW (ref 39–117)
BUN: 19 mg/dL (ref 6–23)
CO2: 30 mEq/L (ref 19–32)
Calcium: 9.4 mg/dL (ref 8.4–10.5)
Chloride: 101 mEq/L (ref 96–112)
Creatinine, Ser: 1.29 mg/dL (ref 0.40–1.50)
GFR: 60.46 mL/min (ref >60.00)
Glucose, Bld: 113 mg/dL — ABNORMAL HIGH (ref 70–99)
Potassium: 4.4 mEq/L (ref 3.5–5.1)
Sodium: 138 mEq/L (ref 135–145)
Total Bilirubin: 0.8 mg/dL (ref 0.2–1.2)
Total Protein: 6.8 g/dL (ref 6.0–8.3)

## 2021-12-11 LAB — TSH: TSH: 2.35 u[IU]/mL (ref 0.35–5.50)

## 2021-12-11 LAB — HEMOGLOBIN A1C: Hgb A1c MFr Bld: 6.7 % — ABNORMAL HIGH (ref 4.6–6.5)

## 2021-12-13 ENCOUNTER — Ambulatory Visit: Payer: Managed Care, Other (non HMO) | Admitting: Endocrinology

## 2021-12-13 ENCOUNTER — Encounter: Payer: Self-pay | Admitting: Endocrinology

## 2021-12-13 VITALS — BP 148/64 | HR 92 | Ht 74.0 in | Wt 267.2 lb

## 2021-12-13 DIAGNOSIS — E1165 Type 2 diabetes mellitus with hyperglycemia: Secondary | ICD-10-CM | POA: Diagnosis not present

## 2021-12-13 DIAGNOSIS — E782 Mixed hyperlipidemia: Secondary | ICD-10-CM | POA: Diagnosis not present

## 2021-12-13 MED ORDER — TRULICITY 3 MG/0.5ML ~~LOC~~ SOAJ: 3.0000 mg | SUBCUTANEOUS | 2 refills | Status: DC

## 2021-12-13 NOTE — Patient Instructions (Signed)
Start TRULICITYwith the pen as shown once weekly on the same day of the week.  You may inject in the stomach, thigh or arm as indicated in the brochure given.  You will feel fullness of the stomach with starting the medication and should try to keep the portions at meals small.  You may experience nausea in the first few days which usually gets better over time   If any questions or concerns are present call the office or the  [facility] at [phone]. Also visit [email or web address] website for more useful information     

## 2021-12-13 NOTE — Progress Notes (Signed)
Patient ID: Lance Beard, male   DOB: Jan 19, 1961, 61 y.o.   MRN: [id number] ? ?       ? ? ?Referring physician: Tedra [place] ? ?Chief complaint: Follow-up of diabetes and high triglycerides ? ?History of Present Illness: ? ? ? ?HYPERLIPIDEMIA: ? 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 ?On his initial consultation his Lipitor was stopped and fenofibrate started ? ?He has also seen the dietitian  ? ?Recently appears to have gained weight ?He says that he is not able to plan his meals well especially with getting some fast food and working late ? ?Triglycerides are relatively higher at 200 compared to 169, HDL also low ?LDL is still controlled and below 100 ? ?Lab Results  ?Component Value Date  ? CHOL 146 12/11/2021  ? CHOL 139 09/11/2021  ? CHOL 127 03/05/2021  ? ?Lab Results  ?Component Value Date  ? HDL 26.30 (L) 12/11/2021  ? HDL 25.40 (L) 09/11/2021  ? HDL 25.80 (L) 03/05/2021  ? ?Lab Results  ?Component Value Date  ? [place] 80 12/11/2021  ? [place] 80 09/11/2021  ? [place] 70 03/05/2021  ? ?Lab Results  ?Component Value Date  ? TRIG 200.0 (H) 12/11/2021  ? TRIG 169.0 (H) 09/11/2021  ? TRIG 152.0 (H) 03/05/2021  ? ?Lab Results  ?Component Value Date  ? CHOLHDL 6 12/11/2021  ? CHOLHDL 5 09/11/2021  ? CHOLHDL 5 03/05/2021  ? ?Lab Results  ?Component Value Date  ? LDLDIRECT 80.0 12/07/2020  ? ? ?Problem 2: DIABETES ? ?Date of diagnosis of type 2 diabetes mellitus: 2020      ? ?Background history:  ? ?He had prediabetes with A1c 6% in 2019 which was unchanged until 07/2020 when it went up to 6.7. ?He apparently was prescribed glipizide ER 2.5 mg daily in 05/2019 which was continued until  probably 2/22 ?He tried Metformin in 6/20 but may not have tolerated this because of diarrhea ?Since his A1c had gone up in 12/21 he was given Januvia 100 mg daily ? ?Recent history:  ? ?Most recent A1c is 6.7 compared to 5.4 ? ? ?Non-insulin hypoglycemic drugs the patient is taking are: Rybelsus 14 mg daily ? ?Current management, blood sugar patterns and problems identified: ?He is able to take his Rybelsus every morning before breakfast as instructed ?Again because of a busy schedule he has not planned his meals consistently and occasionally will get fast food, not always able to get vegetables in her diet ?He is checking his blood sugars fairly regularly at various times and they do not appear to be much higher than before although they are fluctuating in the morning ?Does not appear to have high postprandial readings when checked ?Recently has done his morning walking about 3 days a week  ?His weight has fluctuated and is back to the same level as in 1/23 ?      ?Side effects from medications have been: Possible GI intolerance to Metformin ?    ?Typical meal intake: Breakfast is usually a bagel              ? ?Glucose monitoring:  done <1 times a day  Glucometer: One Touch .    ?   ?Blood Glucose readings by time of day and averages from meter download: ? ? ?PRE-MEAL Fasting Lunch Dinner Bedtime Overall  ?Glucose range: 115-163    76-163  ?Mean/median: 139  110  123  ? ?POST-MEAL PC Breakfast PC Lunch PC Dinner  ?Glucose range:   95-124  ?Mean/median:   104  ? ?Previously ? ?PRE-MEAL Fasting Lunch Dinner Bedtime Overall  ?Glucose range: 110-152  82-111    ?Mean/median: 136    125  ? ?POST-MEAL PC Breakfast PC Lunch PC Dinner  ?Glucose range:   92-166  ?Mean/median:   129  ? ?* ? ? ?Dietician visit, most recent: 11/2020 ? ?Wt Readings from Last 3 Encounters:  ?12/13/21 267 lb 3.2 oz (121.2 kg)  ?10/17/21 260 lb (117.9 kg)  ?09/25/21 260 lb (117.9 kg)  ? ? ? ?Lab Results  ?Component Value Date  ? HGBA1C 6.7  (H) 12/11/2021  ? HGBA1C 6.4 09/11/2021  ? HGBA1C 5.1 03/05/2021  ? ?Lab Results  ?Component Value Date  ? MICROALBUR 0.7 08/17/2020  ? [place] 80 12/11/2021  ? CREATININE 1.29 12/11/2021  ? ? ?Past Medical History:  ?Diagnosis Date  ? Chronic kidney disease   ? kidney stones x2  ? Diabetes mellitus without complication ([place])   ? on meds  ? GERD (gastroesophageal reflux disease)   ? on meds  ? Hx of cardiovascular stress test   ? a. ETT-MV 3/14:  No scar or ischemia, EF 62%  ? Hyperlipidemia   ? on meds  ? Hypertension   ? on meds  ? PONV (postoperative nausea and vomiting)   ? with one surgery  ? Sleep apnea   ? uses CPAP  ? ? ?Past Surgical History:  ?Procedure Laterality Date  ? APPENDECTOMY    ? COLONOSCOPY  2019  ? JP-MAC-suprep(exc)-4 polyps-TA and sessile  ? NASAL SEPTUM SURGERY    ? VARICOCELE EXCISION    ? WISDOM TOOTH EXTRACTION    ? ? ?Family History  ?Problem Relation Age of Onset  ? Diabetes Father   ? Hypertension Father   ? CAD Father   ?     Approximate age 64   ? Lung cancer Father   ? Kidney disease Father   ? Colon cancer Neg Hx   ? Esophageal cancer Neg Hx   ? Stomach cancer Neg Hx   ? Rectal cancer Neg Hx   ? Colon polyps Neg Hx   ? ? ?Social History:  reports that he has never smoked. He has never used smokeless tobacco. He reports that he does not drink alcohol and does not use drugs. ? ?Allergies: No Known Allergies ? ?Allergies as of 12/13/2021   ?No Known Allergies ?  ? ?  ?Medication List  ?  ? ?  ? Accurate as of December 13, 2021  3:54 PM. If you have any questions, ask your nurse or doctor.  ?  ?  ? ?  ? ?ALPRAZolam 0.25 MG tablet ?Commonly known as: Duanne Moron ?TAKE 1 TABLET BY MOUTH 2 TIMES DAILY AS NEEDED FOR ANXIETY. ?  ?aspirin 81 MG tablet ?Take 81 mg by mouth daily. ?  ?B-complex with vitamin C tablet ?Take 1 tablet by mouth daily. ?  ?clobetasol cream 0.05 % ?Commonly known as: TEMOVATE ?APPLY 1 APPLICATION. TOPICALLY 2 (TWO) TIMES DAILY. ?  ?fenofibrate 145 MG tablet ?Commonly known as:  TRICOR ?TAKE 1 TABLET BY MOUTH EVERY DAY ?  ?hydrochlorothiazide 12.5  MG tablet ?Commonly known as: HYDRODIURIL ?Take 1 tablet (12.5 mg total) by mouth daily. ?  ?losartan 25 MG tablet ?Commonly known as: COZAAR ?TAKE 1 TABLET (25 MG TOTAL) BY MOUTH DAILY. ?  ?metoprolol tartrate 25 MG tablet ?Commonly known as: LOPRESSOR ?TAKE 1 TABLET BY MOUTH EVERY DAY IN THE MORNING ?  ?nitroGLYCERIN 0.4 MG SL tablet ?Commonly known as: NITROSTAT ?Place 1 tablet (0.4 mg total) under the tongue every 5 (five) minutes as needed for chest pain. ?  ?nystatin-triamcinolone cream ?Commonly known as: MYCOLOG II ?Apply to scrotum twice a day. ?What changed: additional instructions ?  ?ONE TOUCH ULTRA 2 w/Device Kit ?SMARTSIG:1 Via Meter Every Night ?  ?OneTouch Delica Lancets 18H Misc ?Check glucose twice a day ?  ?OneTouch Ultra test strip ?Generic drug: glucose blood ?Check glucose twice a day ?  ?pantoprazole 40 MG tablet ?Commonly known as: PROTONIX ?TAKE 1 TABLET BY MOUTH EVERY DAY ?  ?Rybelsus 14 MG Tabs ?Generic drug: Semaglutide ?TAKE 1 TABLET BY MOUTH EVERY DAY ?  ? ?  ? ? ?LABS: ? ?Lab on 12/11/2021  ?Component Date Value Ref Range Status  ? TSH 12/11/2021 2.35  0.35 - 5.50 uIU/mL Final  ? Cholesterol 12/11/2021 146  0 - 200 mg/dL Final  ? ATP III Classification       Desirable:  < 200 mg/dL               Borderline High:  200 - 239 mg/dL          High:  > = 240 mg/dL  ? Triglycerides 12/11/2021 200.0 (H)  0.0 - 149.0 mg/dL Final  ? Normal:  <150 mg/dLBorderline High:  150 - 199 mg/dL  ? HDL 12/11/2021 26.30 (L)  >39.00 mg/dL Final  ? VLDL 12/11/2021 40.0  0.0 - 40.0 mg/dL Final  ? LDL Cholesterol 12/11/2021 80  0 - 99 mg/dL Final  ? Total CHOL/HDL Ratio 12/11/2021 6   Final  ?                Men          Women1/2 Average Risk     3.4          3.3Average Risk          5.0          4.42X Average Risk          9.6          7.13X Average Risk          15.0          11.0                      ? NonHDL 12/11/2021 119.79   Final  ?  NOTE:  Non-HDL goal should be 30 mg/dL higher than patient's LDL goal (i.e. LDL goal of < 70 mg/dL, would have non-HDL goal of < 100 mg/dL)  ? Sodium 12/11/2021 138  135 - 145 mEq/L Final  ? Potassium 04/18

## 2021-12-25 ENCOUNTER — Encounter: Payer: Self-pay | Admitting: Dermatology

## 2022-01-02 ENCOUNTER — Other Ambulatory Visit: Payer: Self-pay | Admitting: Internal Medicine

## 2022-01-02 DIAGNOSIS — E119 Type 2 diabetes mellitus without complications: Secondary | ICD-10-CM

## 2022-01-21 ENCOUNTER — Other Ambulatory Visit: Payer: Self-pay | Admitting: Dermatology

## 2022-01-21 ENCOUNTER — Other Ambulatory Visit: Payer: Self-pay | Admitting: Internal Medicine

## 2022-01-21 DIAGNOSIS — L309 Dermatitis, unspecified: Secondary | ICD-10-CM

## 2022-02-01 ENCOUNTER — Other Ambulatory Visit: Payer: Self-pay | Admitting: Endocrinology

## 2022-02-10 ENCOUNTER — Ambulatory Visit
Admission: RE | Admit: 2022-02-10 | Discharge: 2022-02-10 | Disposition: A | Discharge status: Home / Self Care | Payer: Managed Care, Other (non HMO) | Source: Ambulatory Visit | Attending: Emergency Medicine | Admitting: Emergency Medicine

## 2022-02-10 VITALS — BP 144/80 | HR 86 | Temp 98.5°F | Resp 20

## 2022-02-10 DIAGNOSIS — J309 Allergic rhinitis, unspecified: Secondary | ICD-10-CM | POA: Diagnosis not present

## 2022-02-10 DIAGNOSIS — H66001 Acute suppurative otitis media without spontaneous rupture of ear drum, right ear: Secondary | ICD-10-CM

## 2022-02-10 MED ORDER — FLUTICASONE PROPIONATE 50 MCG/ACT NA SUSP: 1.0000 | Freq: Every day | NASAL | 2 refills | Status: DC

## 2022-02-10 MED ORDER — AZITHROMYCIN 250 MG PO TABS: ORAL_TABLET | ORAL | 0 refills | Status: AC

## 2022-02-10 MED ORDER — FEXOFENADINE HCL 180 MG PO TABS: 180.0000 mg | ORAL_TABLET | Freq: Every day | ORAL | 1 refills | Status: DC

## 2022-02-10 NOTE — Discharge Instructions (Signed)
Please see the list below for recommended medications, dosages and frequencies to provide relief of your current symptoms:    Z-Pak (azithromycin): To treat the infection in your right ear that likely reflects a possible bacterial infection in your sinuses as well, please take 2 tablets the first day, then take 1 tablet daily every day thereafter until complete.   Allegra (fexofenadine): This is an excellent second-generation antihistamine that helps to reduce respiratory inflammatory response to environmental allergens.  With reduced respiratory inflammation, your risk of infection decreases.  This medication is not known to cause daytime sleepiness so it can be taken in the daytime.  If you find that it does make you sleepy, please feel free to take it at bedtime.   Flonase (fluticasone): This is a steroid nasal spray that you use once daily, 1 spray in each nare.  This medication does not work well if you decide to use it only used as you feel you need to, it works best used on a daily basis.  After 3 to 5 days of use, you will notice significant reduction of the inflammation and mucus production that is currently being caused by exposure to allergens, whether seasonal or environmental.  The most common side effect of this medication is nosebleeds.  If you experience a nosebleed, please discontinue use for 1 week, then feel free to resume.  I have provided you with a prescription.     Please follow-up within the next 5-7 days either with your primary care provider or urgent care if your symptoms do not resolve.  If you do not have a primary care provider, we will assist you in finding one.   Thank you for visiting urgent care today.  We appreciate the opportunity to participate in your care.

## 2022-02-10 NOTE — ED Provider Notes (Signed)
[facility]    CSN: [id number] Arrival date & time: 02/10/22  0940    HISTORY   Chief Complaint  Patient presents with   Nasal Congestion    Feels like a sinus infection which I have had before. Greenish nasal discharge for a day or so. Starting feeling bad last Wednesday with nasal drainage. - Entered by patient   Headache   Sore Throat   HPI Lance Beard is a 61 y.o. male. Pt here with nasal congestion and sinus pressure with drainage that is green along with a scratchy throat x 6 days. Pt states he has been outside bush hogging and grass trimming over this past week.  Patient has an elevated blood pressure but otherwise normal vital signs on arrival today.  Patient appears to be in no acute distress.  Patient denies history of allergies as a child but admits that he is gotten older, seems to tolerate yard work less and less.  Patient states he has not tried any medications for his symptoms as of yet.  The history is provided by the patient.   Past Medical History:  Diagnosis Date   Chronic kidney disease    kidney stones x2   Diabetes mellitus without complication ([place])    on meds   GERD (gastroesophageal reflux disease)    on meds   Hx of cardiovascular stress test    a. ETT-MV 3/14:  No scar or ischemia, EF 62%   Hyperlipidemia    on meds   Hypertension    on meds   PONV (postoperative nausea and vomiting)    with one surgery   Sleep apnea    uses CPAP   Patient Active Problem List   Diagnosis Date Noted   History of kidney stones 05/01/2017   Fuchs' corneal dystrophy 08/11/2015   Obstructive sleep apnea 03/30/2015   Obesity, unspecified 02/17/2014   History of sleep apnea 02/17/2014   Impaired glucose tolerance 02/17/2014   Essential hypertension, benign 07/23/2013   Hypertriglyceridemia 05/23/2011   Past Surgical History:  Procedure Laterality Date   APPENDECTOMY     COLONOSCOPY  2019   JP-MAC-suprep(exc)-4 polyps-TA and sessile   NASAL  SEPTUM SURGERY     VARICOCELE EXCISION     WISDOM TOOTH EXTRACTION      Home Medications    Prior to Admission medications   Medication Sig Start Date End Date Taking? Authorizing Provider  ALPRAZolam (XANAX) 0.25 MG tablet TAKE 1 TABLET BY MOUTH 2 TIMES DAILY AS NEEDED FOR ANXIETY. 07/30/21   Elby Showers, MD  aspirin 81 MG tablet Take 81 mg by mouth daily.    [provider]  B Complex-C (B-COMPLEX WITH VITAMIN C) tablet Take 1 tablet by mouth daily.    [provider]  Blood Glucose Monitoring Suppl (ONE TOUCH ULTRA 2) w/Device KIT SMARTSIG:1 Via Meter Every Night 08/25/20   [provider]  clobetasol cream (TEMOVATE) 8.45 % APPLY 1 APPLICATION. TOPICALLY 2 (TWO) TIMES DAILY. 01/22/22   Lavonna Monarch, MD  Dulaglutide (TRULICITY) 3 XM/4.6OE SOPN Inject 3 mg into the skin once a week. Start 1 week after finishing sample of 1.5 mg 12/13/21   Elayne Snare, MD  fenofibrate (TRICOR) 145 MG tablet TAKE 1 TABLET BY MOUTH EVERY DAY 02/04/22   Elayne Snare, MD  hydrochlorothiazide (HYDRODIURIL) 12.5 MG tablet Take 1 tablet (12.5 mg total) by mouth daily. 05/09/21   Elby Showers, MD  losartan (COZAAR) 25 MG tablet TAKE 1 TABLET (  25 MG TOTAL) BY MOUTH DAILY. 01/21/22   Elby Showers, MD  metoprolol tartrate (LOPRESSOR) 25 MG tablet TAKE 1 TABLET BY MOUTH EVERY DAY IN THE MORNING 12/05/21   Elby Showers, MD  nitroGLYCERIN (NITROSTAT) 0.4 MG SL tablet Place 1 tablet (0.4 mg total) under the tongue every 5 (five) minutes as needed for chest pain. 01/13/20   Elby Showers, MD  nystatin-triamcinolone (MYCOLOG II) cream Apply to scrotum twice a day. Patient taking differently: Apply to scrotum twice a day PRN 08/23/20   Elby Showers, MD  OneTouch Delica Lancets 47W MISC Check glucose twice a day 11/28/20   Elby Showers, MD  [facility] ULTRA test strip CHECK GLUCOSE TWICE A DAY 01/03/22   Elby Showers, MD  pantoprazole (PROTONIX) 40 MG tablet TAKE 1 TABLET BY MOUTH EVERY DAY  05/08/21   Elby Showers, MD   Family History Family History  Problem Relation Age of Onset   Diabetes Father    Hypertension Father    CAD Father        Approximate age 30    Lung cancer Father    Kidney disease Father    Colon cancer Neg Hx    Esophageal cancer Neg Hx    Stomach cancer Neg Hx    Rectal cancer Neg Hx    Colon polyps Neg Hx    Social History Social History   Tobacco Use   Smoking status: Never   Smokeless tobacco: Never  Vaping Use   Vaping Use: Never used  Substance Use Topics   Alcohol use: No   Drug use: No   Allergies   Patient has no known allergies.  Review of Systems Review of Systems Pertinent findings noted in history of present illness.   Physical Exam Triage Vital Signs ED Triage Vitals  Enc Vitals Group     BP 06/22/21 0827 (!) 147/82     Pulse Rate 06/22/21 0827 72     Resp 06/22/21 0827 18     Temp 06/22/21 0827 98.3 F (36.8 C)     Temp Source 06/22/21 0827 Oral     SpO2 06/22/21 0827 98 %     Weight --      Height --      Head Circumference --      Peak Flow --      Pain Score 06/22/21 0826 5     Pain Loc --      Pain Edu? --      Excl. in [place]? --   No data found.  Updated Vital Signs BP (!) 144/80   Pulse 86   Temp 98.5 F (36.9 C)   Resp 20   SpO2 98%   Physical Exam Vitals and nursing note reviewed.  Constitutional:      General: He is not in acute distress.    Appearance: Normal appearance. He is not ill-appearing.  HENT:     Head: Normocephalic and atraumatic.     Salivary Glands: Right salivary gland is not diffusely enlarged or tender. Left salivary gland is not diffusely enlarged or tender.     Right Ear: Hearing and external ear normal. No drainage. A middle ear effusion is present. There is no impacted cerumen. Tympanic membrane is bulging (Purulent fluid). Tympanic membrane is not injected or erythematous.     Left Ear: Hearing and external ear normal. No drainage. A middle ear effusion is present.  There is no impacted cerumen. Tympanic membrane is  bulging (Mildly suppurative fluid). Tympanic membrane is not injected or erythematous.     Ears:     Comments: Bilateral EACs with mild erythema    Nose: Mucosal edema and rhinorrhea present. No nasal deformity, septal deviation, signs of injury, nasal tenderness or congestion. Rhinorrhea is clear.     Right Nostril: Occlusion present. No foreign body, epistaxis or septal hematoma.     Left Nostril: Occlusion present. No foreign body, epistaxis or septal hematoma.     Right Turbinates: Enlarged and swollen.     Left Turbinates: Enlarged and swollen.     Right Sinus: No maxillary sinus tenderness or frontal sinus tenderness.     Left Sinus: No maxillary sinus tenderness or frontal sinus tenderness.     Mouth/Throat:     Lips: Pink. No lesions.     Mouth: Mucous membranes are moist. No oral lesions.     Pharynx: Oropharynx is clear. Uvula midline. No posterior oropharyngeal erythema or uvula swelling.     Tonsils: No tonsillar exudate. 0 on the right. 0 on the left.     Comments: Postnasal drip Eyes:     General: Lids are normal.        Right eye: No discharge.        Left eye: No discharge.     Extraocular Movements: Extraocular movements intact.     Conjunctiva/sclera: Conjunctivae normal.     Right eye: Right conjunctiva is not injected.     Left eye: Left conjunctiva is not injected.  Neck:     Trachea: Trachea and phonation normal.  Cardiovascular:     Rate and Rhythm: Normal rate and regular rhythm.     Pulses: Normal pulses.     Heart sounds: Normal heart sounds. No murmur heard.    No friction rub. No gallop.  Pulmonary:     Effort: Pulmonary effort is normal. No accessory muscle usage, prolonged expiration or respiratory distress.     Breath sounds: Normal breath sounds. No stridor, decreased air movement or transmitted upper airway sounds. No decreased breath sounds, wheezing, rhonchi or rales.  Chest:     Chest wall: No  tenderness.  Musculoskeletal:        General: Normal range of motion.     Cervical back: Normal range of motion and neck supple. Normal range of motion.  Lymphadenopathy:     Cervical: Cervical adenopathy present.     Right cervical: Superficial cervical adenopathy present.     Left cervical: Superficial cervical adenopathy present.  Skin:    General: Skin is warm and dry.     Findings: No erythema or rash.  Neurological:     General: No focal deficit present.     Mental Status: He is alert and oriented to person, place, and time.  Psychiatric:        Mood and Affect: Mood normal.        Behavior: Behavior normal.     Visual Acuity Right Eye Distance:   Left Eye Distance:   Bilateral Distance:    Right Eye Near:   Left Eye Near:    Bilateral Near:     UC Couse / Diagnostics / Procedures:    EKG  Radiology No results found.  Procedures Procedures (including critical care time)  UC Diagnoses / Final Clinical Impressions(s)   I have reviewed the triage vital signs and the nursing notes.  Pertinent labs & imaging results that were available during my care of the patient were reviewed by  me and considered in my medical decision making (see chart for details).   Final diagnoses:  Allergic rhinitis, unspecified seasonality, unspecified trigger  Non-recurrent acute suppurative otitis media of right ear without spontaneous rupture of tympanic membrane   Patient provided with a Z-Pak to treat early signs of acute otitis media and bacterial sinusitis based on physical exam findings and history provided.  Return precautions advised.  Patient advised to begin allergy medications as prevention for future fractions over the summer while doing yard work.  ED Prescriptions     Medication Sig Dispense Auth. Provider   azithromycin (ZITHROMAX) 250 MG tablet Take 2 tablets (500 mg total) by mouth daily for 1 day, THEN 1 tablet (250 mg total) daily for 4 days. 6 tablet Lynden Oxford  Scales, PA-C   fexofenadine (ALLEGRA) 180 MG tablet Take 1 tablet (180 mg total) by mouth daily. 90 tablet Lynden Oxford Scales, PA-C   fluticasone (FLONASE) 50 MCG/ACT nasal spray Place 1 spray into both nostrils daily. Begin by using 2 sprays in each nare daily for 3 to 5 days, then decrease to 1 spray in each nare daily. 15.8 mL Lynden Oxford Scales, PA-C      PDMP not reviewed this encounter.  Pending results:  Labs Reviewed - No data to display  Medications Ordered in UC: Medications - No data to display  Disposition Upon Discharge:  Condition: stable for discharge home Home: take medications as prescribed; routine discharge instructions as discussed; follow up as advised.  Patient presented with an acute illness with associated systemic symptoms and significant discomfort requiring urgent management. In my opinion, this is a condition that a prudent lay person (someone who possesses an average knowledge of health and medicine) may potentially expect to result in complications if not addressed urgently such as respiratory distress, impairment of bodily function or dysfunction of bodily organs.   Routine symptom specific, illness specific and/or disease specific instructions were discussed with the patient and/or caregiver at length.   As such, the patient has been evaluated and assessed, work-up was performed and treatment was provided in alignment with urgent care protocols and evidence based medicine.  Patient/parent/caregiver has been advised that the patient may require follow up for further testing and treatment if the symptoms continue in spite of treatment, as clinically indicated and appropriate.  If the patient was tested for COVID-19, Influenza and/or RSV, then the patient/parent/guardian was advised to isolate at home pending the results of his/her diagnostic coronavirus test and potentially longer if they're positive. I have also advised pt that if his/her COVID-19 test  returns positive, it's recommended to self-isolate for at least 10 days after symptoms first appeared AND until fever-free for 24 hours without fever reducer AND other symptoms have improved or resolved. Discussed self-isolation recommendations as well as instructions for household member/close contacts as per the [facility] and [place] [facility], and also gave patient the [place] packet with this information.  Patient/parent/caregiver has been advised to return to the [facility] or PCP in 3-5 days if no better; to PCP or the Emergency Department if new signs and symptoms develop, or if the current signs or symptoms continue to change or worsen for further workup, evaluation and treatment as clinically indicated and appropriate  The patient will follow up with their current PCP if and as advised. If the patient does not currently have a PCP we will assist them in obtaining one.   The patient may need specialty follow up if the symptoms continue, in spite  of conservative treatment and management, for further workup, evaluation, consultation and treatment as clinically indicated and appropriate.  Patient/parent/caregiver verbalized understanding and agreement of plan as discussed.  All questions were addressed during visit.  Please see discharge instructions below for further details of plan.  Discharge Instructions:   Discharge Instructions      Please see the list below for recommended medications, dosages and frequencies to provide relief of your current symptoms:    Z-Pak (azithromycin): To treat the infection in your right ear that likely reflects a possible bacterial infection in your sinuses as well, please take 2 tablets the first day, then take 1 tablet daily every day thereafter until complete.   Allegra (fexofenadine): This is an excellent second-generation antihistamine that helps to reduce respiratory inflammatory response to environmental allergens.  With reduced respiratory inflammation, your risk of  infection decreases.  This medication is not known to cause daytime sleepiness so it can be taken in the daytime.  If you find that it does make you sleepy, please feel free to take it at bedtime.   Flonase (fluticasone): This is a steroid nasal spray that you use once daily, 1 spray in each nare.  This medication does not work well if you decide to use it only used as you feel you need to, it works best used on a daily basis.  After 3 to 5 days of use, you will notice significant reduction of the inflammation and mucus production that is currently being caused by exposure to allergens, whether seasonal or environmental.  The most common side effect of this medication is nosebleeds.  If you experience a nosebleed, please discontinue use for 1 week, then feel free to resume.  I have provided you with a prescription.     Please follow-up within the next 5-7 days either with your primary care provider or urgent care if your symptoms do not resolve.  If you do not have a primary care provider, we will assist you in finding one.   Thank you for visiting urgent care today.  We appreciate the opportunity to participate in your care.       This office note has been dictated using Museum/gallery curator.  Unfortunately, and despite my best efforts, this method of dictation can sometimes lead to occasional typographical or grammatical errors.  I apologize in advance if this occurs.     Lynden Oxford Scales, PA-C 02/10/22 1007

## 2022-02-10 NOTE — ED Triage Notes (Signed)
Pt here with nasal congestion and sinus pressure with drainage that is green along with a scratchy throat x 6 days. Pt has been outside bush hogging and grass trimming over this past week.

## 2022-02-19 ENCOUNTER — Other Ambulatory Visit (INDEPENDENT_AMBULATORY_CARE_PROVIDER_SITE_OTHER): Payer: Managed Care, Other (non HMO)

## 2022-02-19 DIAGNOSIS — E1165 Type 2 diabetes mellitus with hyperglycemia: Secondary | ICD-10-CM

## 2022-02-19 LAB — GLUCOSE, RANDOM: Glucose, Bld: 124 mg/dL — ABNORMAL HIGH (ref 70–99)

## 2022-02-19 LAB — HEMOGLOBIN A1C: Hgb A1c MFr Bld: 6.3 % (ref 4.6–6.5)

## 2022-02-21 ENCOUNTER — Encounter: Payer: Self-pay | Admitting: Endocrinology

## 2022-02-21 ENCOUNTER — Ambulatory Visit: Payer: Managed Care, Other (non HMO) | Admitting: Endocrinology

## 2022-02-21 VITALS — BP 148/84 | HR 88 | Ht 76.0 in | Wt 264.6 lb

## 2022-02-21 DIAGNOSIS — E782 Mixed hyperlipidemia: Secondary | ICD-10-CM

## 2022-02-21 DIAGNOSIS — E1165 Type 2 diabetes mellitus with hyperglycemia: Secondary | ICD-10-CM

## 2022-02-21 NOTE — Patient Instructions (Addendum)
More walking  Check Bp

## 2022-02-21 NOTE — Progress Notes (Signed)
Patient ID: Lance Beard, male   DOB: 21-Dec-1960, 61 y.o.   MRN: [id number]           Referring physician: Tedra [place]  Chief complaint: Endocrinology follow-up  History of Present Illness:   HYPERLIPIDEMIA:  He was found to have hypertriglyceridemia around the year 2010 Initially was tried on TriCor but he felt that it was causing excessive sweating and he did not take it for long He was subsequently switched to atorvastatin  On review of his lipids as far back as 2012 indicate that he has not had hypercholesterolemia but mostly high triglyceride levels with low HDL  On his initial consultation his Lipitor was stopped and fenofibrate started  He has also seen the dietitian   Triglycerides are relatively higher at 200 and April compared to 169, HDL also low LDL is still controlled and below 100  Lab Results  Component Value Date   CHOL 146 12/11/2021   CHOL 139 09/11/2021   CHOL 127 03/05/2021   Lab Results  Component Value Date   HDL 26.30 (L) 12/11/2021   HDL 25.40 (L) 09/11/2021   HDL 25.80 (L) 03/05/2021   Lab Results  Component Value Date   LDLCALC 80 12/11/2021   [place] 80 09/11/2021   LDLCALC 70 03/05/2021   Lab Results  Component Value Date   TRIG 200.0 (H) 12/11/2021   TRIG 169.0 (H) 09/11/2021   TRIG 152.0 (H) 03/05/2021   Lab Results  Component Value Date   CHOLHDL 6 12/11/2021   CHOLHDL 5 09/11/2021   CHOLHDL 5 03/05/2021   Lab Results  Component Value Date   LDLDIRECT 80.0 12/07/2020    Problem 2: DIABETES  Date of diagnosis of type 2 diabetes mellitus: 2020       Background history:   He had prediabetes with A1c 6% in 2019 which was unchanged until 07/2020 when it went up to 6.7. He apparently was prescribed glipizide ER 2.5 mg daily in 05/2019 which was continued until probably 2/22 He tried Metformin in 6/20 but may not have tolerated this because of diarrhea Since his A1c had gone up in 12/21 he was given Januvia 100 mg  daily  Recent history:   Most recent A1c is 6.3 and improved   Non-insulin hypoglycemic drugs the patient is taking are: TRULICITY 3 mg weekly  Current management, blood sugar patterns and problems identified: He is now taking Trulicity since April instead of Rybelsus Prior A1c 6.7 Does not complain of chronic nausea with this and he finds that he has better satiety with this If he does not limit his portions he will get bloated Has no difficulty doing the injections Does not appear to have high readings after lunch or dinner Fasting readings are slightly better He is trying to do some walking but likely only about 2 or 3 days a week       Side effects from medications have been: Possible GI intolerance to Metformin     Typical meal intake: Breakfast is usually a bagel               Glucose monitoring:  done <1 times a day         Glucometer: One Touch .       Blood Glucose readings by time of day and averages from meter download:   PRE-MEAL Fasting Lunch Afternoon Bedtime Overall   116-143  89-131  89-143  Mean/median: 131    120   POST-MEAL PC Breakfast PC  Lunch PC Dinner  Glucose range:   96-140  Mean/median:      Previously:  PRE-MEAL Fasting Lunch Dinner Bedtime Overall  Glucose range: 115-163    76-163  Mean/median: 139  110  123   POST-MEAL PC Breakfast PC Lunch PC Dinner  Glucose range:   95-124  Mean/median:   104     Dietician visit, most recent: 11/2020  Wt Readings from Last 3 Encounters:  02/21/22 264 lb 9.6 oz (120 kg)  12/13/21 267 lb 3.2 oz (121.2 kg)  10/17/21 260 lb (117.9 kg)     Lab Results  Component Value Date   HGBA1C 6.3 02/19/2022   HGBA1C 6.7 (H) 12/11/2021   HGBA1C 6.4 09/11/2021   Lab Results  Component Value Date   MICROALBUR 0.7 08/17/2020   [place] 80 12/11/2021   CREATININE 1.29 12/11/2021    Past Medical History:  Diagnosis Date   Chronic kidney disease    kidney stones x2   Diabetes mellitus without  complication ([place])    on meds   GERD (gastroesophageal reflux disease)    on meds   Hx of cardiovascular stress test    a. ETT-MV 3/14:  No scar or ischemia, EF 62%   Hyperlipidemia    on meds   Hypertension    on meds   PONV (postoperative nausea and vomiting)    with one surgery   Sleep apnea    uses CPAP    Past Surgical History:  Procedure Laterality Date   APPENDECTOMY     COLONOSCOPY  2019   JP-MAC-suprep(exc)-4 polyps-TA and sessile   NASAL SEPTUM SURGERY     VARICOCELE EXCISION     WISDOM TOOTH EXTRACTION      Family History  Problem Relation Age of Onset   Diabetes Father    Hypertension Father    CAD Father        Approximate age 20    Lung cancer Father    Kidney disease Father    Colon cancer Neg Hx    Esophageal cancer Neg Hx    Stomach cancer Neg Hx    Rectal cancer Neg Hx    Colon polyps Neg Hx     Social History:  reports that he has never smoked. He has never used smokeless tobacco. He reports that he does not drink alcohol and does not use drugs.  Allergies: No Known Allergies  Allergies as of 02/21/2022   No Known Allergies      Medication List        Accurate as of February 21, 2022  8:59 AM. If you have any questions, ask your nurse or doctor.          ALPRAZolam 0.25 MG tablet Commonly known as: XANAX TAKE 1 TABLET BY MOUTH 2 TIMES DAILY AS NEEDED FOR ANXIETY.   aspirin 81 MG tablet Take 81 mg by mouth daily.   B-complex with vitamin C tablet Take 1 tablet by mouth daily.   clobetasol cream 0.05 % Commonly known as: TEMOVATE APPLY 1 APPLICATION. TOPICALLY 2 (TWO) TIMES DAILY.   fenofibrate 145 MG tablet Commonly known as: TRICOR TAKE 1 TABLET BY MOUTH EVERY DAY   fexofenadine 180 MG tablet Commonly known as: ALLEGRA Take 1 tablet (180 mg total) by mouth daily.   fluticasone 50 MCG/ACT nasal spray Commonly known as: FLONASE Place 1 spray into both nostrils daily. Begin by using 2 sprays in each nare daily for 3 to 5  days, then decrease to  1 spray in each nare daily.   hydrochlorothiazide 12.5 MG tablet Commonly known as: HYDRODIURIL Take 1 tablet (12.5 mg total) by mouth daily.   losartan 25 MG tablet Commonly known as: COZAAR TAKE 1 TABLET (25 MG TOTAL) BY MOUTH DAILY.   metoprolol tartrate 25 MG tablet Commonly known as: LOPRESSOR TAKE 1 TABLET BY MOUTH EVERY DAY IN THE MORNING   nitroGLYCERIN 0.4 MG SL tablet Commonly known as: NITROSTAT Place 1 tablet (0.4 mg total) under the tongue every 5 (five) minutes as needed for chest pain.   nystatin-triamcinolone cream Commonly known as: MYCOLOG II Apply to scrotum twice a day. What changed: additional instructions   ONE TOUCH ULTRA 2 w/Device Kit SMARTSIG:1 Via Meter Every Night   OneTouch Delica Lancets 01V Misc Check glucose twice a day   OneTouch Ultra test strip Generic drug: glucose blood CHECK GLUCOSE TWICE A DAY   pantoprazole 40 MG tablet Commonly known as: PROTONIX TAKE 1 TABLET BY MOUTH EVERY DAY   Trulicity 3 CB/4.4HQ Sopn Generic drug: Dulaglutide Inject 3 mg into the skin once a week. Start 1 week after finishing sample of 1.5 mg        LABS:  Lab on 02/19/2022  Component Date Value Ref Range Status   Hgb A1c MFr Bld 02/19/2022 6.3  4.6 - 6.5 % Final   Glycemic Control Guidelines for People with Diabetes:Non Diabetic:  <6%Goal of Therapy: <7%Additional Action Suggested:  >8%    Glucose, Bld 02/19/2022 124 (H)  70 - 99 mg/dL Final        Review of Systems  Last eye exam was 06/2020 with no retinopathy  Hypertension: This is monitored by his PCP    BP Readings from Last 3 Encounters:  02/21/22 (!) 148/84  02/10/22 (!) 144/80  12/13/21 (!) 148/64   Lab Results  Component Value Date   CREATININE 1.29 12/11/2021   CREATININE 1.34 09/11/2021   CREATININE 1.24 03/05/2021   Cardiac: Coronary calcium score is 69th percentile However his CT angiogram did not show any lesions or plaque   ?  Fatty  liver: His liver functions have fluctuated  Lab Results  Component Value Date   ALT 53 12/11/2021   ALT 62 (H) 09/11/2021   ALT 50 03/05/2021     PHYSICAL EXAM:  BP (!) 148/84   Pulse 88   Ht _0  (1.93 m)   Wt 264 lb 9.6 oz (120 kg)   SpO2 94%   BMI 32.21 kg/m      ASSESSMENT:   DIABETES with obesity:  See history of present illness for detailed discussion of current diabetes management, blood sugar patterns and problems identified  His A1c is improved with switching from Rybelsus to Trulicity 3 mg weekly which he is tolerating well Blood sugars are excellent with median reading 123 and highest reading only 143 which is in the morning and better readings after meals also  His blood pressure is relatively high and he needs to check it at home and discuss with PCP  PLAN:   No change in medications  Continue monitoring blood sugars by rotation at different times .  Does need to try and do more regular walking up to 5 days a week To check triglycerides again in the next visit  Patient Instructions  More walking  Check Bp  Elayne Snare 02/21/2022, 8:59 AM

## 2022-03-01 ENCOUNTER — Encounter: Payer: Self-pay | Admitting: Internal Medicine

## 2022-03-01 ENCOUNTER — Other Ambulatory Visit: Payer: Self-pay

## 2022-03-01 MED ORDER — HYDROCORTISONE ACE-PRAMOXINE 1-1 % EX CREA: 1.0000 | TOPICAL_CREAM | Freq: Two times a day (BID) | CUTANEOUS | 0 refills | Status: DC

## 2022-03-20 ENCOUNTER — Other Ambulatory Visit: Payer: Self-pay | Admitting: Endocrinology

## 2022-03-22 ENCOUNTER — Encounter: Payer: Self-pay | Admitting: Endocrinology

## 2022-03-22 DIAGNOSIS — N200 Calculus of kidney: Secondary | ICD-10-CM | POA: Diagnosis not present

## 2022-03-22 DIAGNOSIS — R1084 Generalized abdominal pain: Secondary | ICD-10-CM | POA: Diagnosis not present

## 2022-03-25 NOTE — Telephone Encounter (Signed)
Patient states he changed insurance and insurance is requesting a PA be done for the Trulicity. Can we please start PA?

## 2022-03-26 ENCOUNTER — Other Ambulatory Visit (HOSPITAL_COMMUNITY): Payer: Self-pay

## 2022-03-26 ENCOUNTER — Telehealth: Payer: Self-pay | Admitting: Pharmacy Technician

## 2022-03-26 NOTE — Telephone Encounter (Signed)
Patient Advocate Encounter   Received notification from RMA/PT that prior authorization for Trulicity '3mg'$  is required/requested.   PA submitted on 03/27/23 to [employer] via CoverMyMeds Key [id number] Status is pending  Pharmacy Patient Advocate Fax:  [phone]

## 2022-03-26 NOTE — Telephone Encounter (Signed)
Patient Advocate Encounter  Prior Authorization for Trulicity '3mg'$  has been approved.    Effective dates: 03/26/22 through 03/25/23  Per Test Claim Patients co-pay is $25.   Spoke with Pharmacy to Process.  Patient Advocate Fax:  [phone]

## 2022-03-27 NOTE — Telephone Encounter (Signed)
Message sent to patient in mychart.

## 2022-03-29 DIAGNOSIS — M549 Dorsalgia, unspecified: Secondary | ICD-10-CM | POA: Diagnosis not present

## 2022-03-29 DIAGNOSIS — K5730 Diverticulosis of large intestine without perforation or abscess without bleeding: Secondary | ICD-10-CM | POA: Diagnosis not present

## 2022-03-29 DIAGNOSIS — K760 Fatty (change of) liver, not elsewhere classified: Secondary | ICD-10-CM | POA: Diagnosis not present

## 2022-03-29 DIAGNOSIS — R1084 Generalized abdominal pain: Secondary | ICD-10-CM | POA: Diagnosis not present

## 2022-03-29 DIAGNOSIS — Z87442 Personal history of urinary calculi: Secondary | ICD-10-CM | POA: Diagnosis not present

## 2022-04-02 ENCOUNTER — Other Ambulatory Visit: Payer: Self-pay | Admitting: Internal Medicine

## 2022-04-16 ENCOUNTER — Other Ambulatory Visit: Payer: Self-pay | Admitting: Endocrinology

## 2022-04-21 ENCOUNTER — Other Ambulatory Visit: Payer: Self-pay | Admitting: Internal Medicine

## 2022-04-21 DIAGNOSIS — E119 Type 2 diabetes mellitus without complications: Secondary | ICD-10-CM

## 2022-04-25 ENCOUNTER — Other Ambulatory Visit: Payer: Self-pay | Admitting: Internal Medicine

## 2022-04-25 DIAGNOSIS — E119 Type 2 diabetes mellitus without complications: Secondary | ICD-10-CM

## 2022-06-01 ENCOUNTER — Other Ambulatory Visit: Payer: Self-pay | Admitting: Internal Medicine

## 2022-06-08 ENCOUNTER — Other Ambulatory Visit: Payer: Self-pay | Admitting: Endocrinology

## 2022-06-21 ENCOUNTER — Other Ambulatory Visit (INDEPENDENT_AMBULATORY_CARE_PROVIDER_SITE_OTHER): Payer: BC Managed Care – PPO

## 2022-06-21 DIAGNOSIS — E1165 Type 2 diabetes mellitus with hyperglycemia: Secondary | ICD-10-CM

## 2022-06-21 DIAGNOSIS — E782 Mixed hyperlipidemia: Secondary | ICD-10-CM | POA: Diagnosis not present

## 2022-06-21 LAB — COMPREHENSIVE METABOLIC PANEL
ALT: 53 U/L (ref 0–53)
AST: 25 U/L (ref 0–37)
Albumin: 4.3 g/dL (ref 3.5–5.2)
Alkaline Phosphatase: 19 U/L — ABNORMAL LOW (ref 39–117)
BUN: 21 mg/dL (ref 6–23)
CO2: 30 mEq/L (ref 19–32)
Calcium: 9.3 mg/dL (ref 8.4–10.5)
Chloride: 100 mEq/L (ref 96–112)
Creatinine, Ser: 1.2 mg/dL (ref 0.40–1.50)
GFR: 65.7 mL/min (ref >60.00)
Glucose, Bld: 131 mg/dL — ABNORMAL HIGH (ref 70–99)
Potassium: 4 mEq/L (ref 3.5–5.1)
Sodium: 137 mEq/L (ref 135–145)
Total Bilirubin: 0.7 mg/dL (ref 0.2–1.2)
Total Protein: 6.8 g/dL (ref 6.0–8.3)

## 2022-06-21 LAB — LIPID PANEL
Cholesterol: 140 mg/dL (ref 0–200)
HDL: 23.5 mg/dL — ABNORMAL LOW (ref >39.00)
NonHDL: 116.91
Total CHOL/HDL Ratio: 6
Triglycerides: 246 mg/dL — ABNORMAL HIGH (ref 0.0–149.0)
VLDL: 49.2 mg/dL — ABNORMAL HIGH (ref 0.0–40.0)

## 2022-06-21 LAB — MICROALBUMIN / CREATININE URINE RATIO
Creatinine,U: 115.8 mg/dL
Microalb Creat Ratio: 0.6 mg/g (ref 0.0–30.0)
Microalb, Ur: <0.7 mg/dL (ref 0.0–1.9)

## 2022-06-21 LAB — LDL CHOLESTEROL, DIRECT: Direct LDL: 90 mg/dL

## 2022-06-21 LAB — HEMOGLOBIN A1C: Hgb A1c MFr Bld: 6.5 % (ref 4.6–6.5)

## 2022-06-25 ENCOUNTER — Encounter: Payer: Self-pay | Admitting: Endocrinology

## 2022-06-25 ENCOUNTER — Ambulatory Visit: Payer: BC Managed Care – PPO | Admitting: Endocrinology

## 2022-06-25 VITALS — BP 132/72 | HR 87 | Ht 74.0 in | Wt 267.4 lb

## 2022-06-25 DIAGNOSIS — E1165 Type 2 diabetes mellitus with hyperglycemia: Secondary | ICD-10-CM

## 2022-06-25 DIAGNOSIS — E782 Mixed hyperlipidemia: Secondary | ICD-10-CM

## 2022-06-25 MED ORDER — OMEGA-3-ACID ETHYL ESTERS 1 G PO CAPS: 2.0000 g | ORAL_CAPSULE | Freq: Two times a day (BID) | ORAL | 3 refills | Status: DC

## 2022-06-25 MED ORDER — TIRZEPATIDE 5 MG/0.5ML ~~LOC~~ SOAJ: 5.0000 mg | SUBCUTANEOUS | 0 refills | Status: DC

## 2022-06-25 NOTE — Progress Notes (Signed)
Patient ID: Lance Beard, male   DOB: 11-15-1960, 61 y.o.   MRN: [id number]           Referring physician: Tedra Senegal  Chief complaint: Endocrinology follow-up  History of Present Illness:   HYPERLIPIDEMIA:  He was found to have hypertriglyceridemia around the year 2010 Initially was tried on TriCor but he felt that it was causing excessive sweating and he did not take it for long He was subsequently switched to atorvastatin  On review of his lipids as far back as 2012 indicate that he has not had hypercholesterolemia but mostly high triglyceride levels with low HDL  On his initial consultation his Lipitor was stopped and fenofibrate started  He has also seen the dietitian  He is usually watching diet but has not been able to lose weight, does not drink alcohol  Triglycerides are relatively higher over 200 compared to 169 in 1/23, HDL also low LDL is still controlled and below 100  Lab Results  Component Value Date   CHOL 140 06/21/2022   CHOL 146 12/11/2021   CHOL 139 09/11/2021   Lab Results  Component Value Date   HDL 23.50 (L) 06/21/2022   HDL 26.30 (L) 12/11/2021   HDL 25.40 (L) 09/11/2021   Lab Results  Component Value Date   LDLCALC 80 12/11/2021   [place] 80 09/11/2021   LDLCALC 70 03/05/2021   Lab Results  Component Value Date   TRIG 246.0 (H) 06/21/2022   TRIG 200.0 (H) 12/11/2021   TRIG 169.0 (H) 09/11/2021   Lab Results  Component Value Date   CHOLHDL 6 06/21/2022   CHOLHDL 6 12/11/2021   CHOLHDL 5 09/11/2021   Lab Results  Component Value Date   LDLDIRECT 90.0 06/21/2022   LDLDIRECT 80.0 12/07/2020    Problem 2: DIABETES  Date of diagnosis of type 2 diabetes mellitus: 2020       Background history:   He had prediabetes with A1c 6% in 2019 which was unchanged until 07/2020 when it went up to 6.7. He apparently was prescribed glipizide ER 2.5 mg daily in 05/2019 which was continued until probably 2/22 He tried Metformin in 6/20 but may  not have tolerated this because of diarrhea Since his A1c had gone up in 12/21 he was given Januvia 100 mg daily  Recent history:   Most recent A1c is 6.5 compared to 6.3   Non-insulin hypoglycemic drugs the patient is taking are: TRULICITY 3 mg weekly  Current management, blood sugar patterns and problems identified: He is still taking Trulicity since April instead of Rybelsus  Although he thinks his portions are generally controlled he is not able to lose weight Does have sporadically higher readings in the mornings, some of this could be related to eating as late as 9 PM sometimes in the evening However he has not been able to find much time for exercise and only going for vigorous walk once or twice a week Fasting readings are trending higher        Side effects from medications have been: Possible GI intolerance to Metformin     Typical meal intake: Breakfast is usually a bagel               Glucose monitoring:  done <1 times a day         Glucometer: One Touch .       Blood Glucose readings by time of day and averages from meter download:   PRE-MEAL Fasting Lunch Evenings  Bedtime Overall  Glucose range: 120-175  90-140  90-175  Mean/median: 139    129   Previous:  PRE-MEAL Fasting Lunch Afternoon Bedtime Overall   116-143  89-131  89-143  Mean/median: 131    120   POST-MEAL PC Breakfast PC Lunch PC Dinner  Glucose range:   96-140  Mean/median:       Dietician visit, most recent: 11/2020  Wt Readings from Last 3 Encounters:  06/25/22 267 lb 6.4 oz (121.3 kg)  02/21/22 264 lb 9.6 oz (120 kg)  12/13/21 267 lb 3.2 oz (121.2 kg)     Lab Results  Component Value Date   HGBA1C 6.5 06/21/2022   HGBA1C 6.3 02/19/2022   HGBA1C 6.7 (H) 12/11/2021   Lab Results  Component Value Date   MICROALBUR <0.7 06/21/2022   [place] 80 12/11/2021   CREATININE 1.20 06/21/2022    Past Medical History:  Diagnosis Date   Chronic kidney disease    kidney stones x2    Diabetes mellitus without complication ([place])    on meds   GERD (gastroesophageal reflux disease)    on meds   Hx of cardiovascular stress test    a. ETT-MV 3/14:  No scar or ischemia, EF 62%   Hyperlipidemia    on meds   Hypertension    on meds   PONV (postoperative nausea and vomiting)    with one surgery   Sleep apnea    uses CPAP    Past Surgical History:  Procedure Laterality Date   APPENDECTOMY     COLONOSCOPY  2019   JP-MAC-suprep(exc)-4 polyps-TA and sessile   NASAL SEPTUM SURGERY     VARICOCELE EXCISION     WISDOM TOOTH EXTRACTION      Family History  Problem Relation Age of Onset   Diabetes Father    Hypertension Father    CAD Father        Approximate age 56    Lung cancer Father    Kidney disease Father    Colon cancer Neg Hx    Esophageal cancer Neg Hx    Stomach cancer Neg Hx    Rectal cancer Neg Hx    Colon polyps Neg Hx     Social History:  reports that he has never smoked. He has never used smokeless tobacco. He reports that he does not drink alcohol and does not use drugs.  Allergies: No Known Allergies  Allergies as of 06/25/2022   No Known Allergies      Medication List        Accurate as of June 25, 2022  8:23 AM. If you have any questions, ask your nurse or doctor.          ALPRAZolam 0.25 MG tablet Commonly known as: XANAX TAKE 1 TABLET BY MOUTH TWICE A DAY AS NEEDED FOR ANXIETY   aspirin 81 MG tablet Take 81 mg by mouth daily.   B-complex with vitamin C tablet Take 1 tablet by mouth daily.   clobetasol cream 0.05 % Commonly known as: TEMOVATE APPLY 1 APPLICATION. TOPICALLY 2 (TWO) TIMES DAILY.   fenofibrate 145 MG tablet Commonly known as: TRICOR TAKE 1 TABLET BY MOUTH EVERY DAY   fexofenadine 180 MG tablet Commonly known as: ALLEGRA Take 1 tablet (180 mg total) by mouth daily.   fluticasone 50 MCG/ACT nasal spray Commonly known as: FLONASE Place 1 spray into both nostrils daily. Begin by using 2 sprays in  each nare daily for 3 to 5 days,  then decrease to 1 spray in each nare daily.   hydrochlorothiazide 12.5 MG tablet Commonly known as: HYDRODIURIL TAKE 1 TABLET BY MOUTH EVERY DAY   losartan 25 MG tablet Commonly known as: COZAAR TAKE 1 TABLET (25 MG TOTAL) BY MOUTH DAILY.   metoprolol tartrate 25 MG tablet Commonly known as: LOPRESSOR TAKE 1 TABLET BY MOUTH EVERY DAY IN THE MORNING   nitroGLYCERIN 0.4 MG SL tablet Commonly known as: NITROSTAT Place 1 tablet (0.4 mg total) under the tongue every 5 (five) minutes as needed for chest pain.   nystatin-triamcinolone cream Commonly known as: MYCOLOG II Apply to scrotum twice a day. What changed: additional instructions   ONE TOUCH ULTRA 2 w/Device Kit SMARTSIG:1 Via Meter Every Night   OneTouch Delica Plus [id number] Misc CHECK GLUCOSE TWICE A DAY   OneTouch Ultra test strip Generic drug: glucose blood CHECK GLUCOSE TWICE A DAY   pantoprazole 40 MG tablet Commonly known as: PROTONIX TAKE 1 TABLET BY MOUTH EVERY DAY   pramoxine-hydrocortisone 1-1 % rectal cream Commonly known as: PROCTOCREAM-HC Place 1 Application rectally 2 (two) times daily.   Trulicity 3 ID/7.8EU Sopn Generic drug: Dulaglutide INJECT 3 MG INTO THE SKIN ONCE A WEEK. START 1 WEEK AFTER FINISHING SAMPLE OF 1.5 MG        LABS:  Lab on 06/21/2022  Component Date Value Ref Range Status   Microalb, Ur 06/21/2022 <0.7  0.0 - 1.9 mg/dL Final   Creatinine,U 06/21/2022 115.8  mg/dL Final   Microalb Creat Ratio 06/21/2022 0.6  0.0 - 30.0 mg/g Final   Cholesterol 06/21/2022 140  0 - 200 mg/dL Final   ATP III Classification       Desirable:  < 200 mg/dL               Borderline High:  200 - 239 mg/dL          High:  > = 240 mg/dL   Triglycerides 06/21/2022 246.0 (H)  0.0 - 149.0 mg/dL Final   Normal:  <150 mg/dLBorderline High:  150 - 199 mg/dL   HDL 06/21/2022 23.50 (L)  >39.00 mg/dL Final   VLDL 06/21/2022 49.2 (H)  0.0 - 40.0 mg/dL Final   Total CHOL/HDL  Ratio 06/21/2022 6   Final                  Men          Women1/2 Average Risk     3.4          3.3Average Risk          5.0          4.42X Average Risk          9.6          7.13X Average Risk          15.0          11.0                       NonHDL 06/21/2022 116.91   Final   NOTE:  Non-HDL goal should be 30 mg/dL higher than patient's LDL goal (i.e. LDL goal of < 70 mg/dL, would have non-HDL goal of < 100 mg/dL)   Sodium 06/21/2022 137  135 - 145 mEq/L Final   Potassium 06/21/2022 4.0  3.5 - 5.1 mEq/L Final   Chloride 06/21/2022 100  96 - 112 mEq/L Final   CO2 06/21/2022 30  19 - 32 mEq/L  Final   Glucose, Bld 06/21/2022 131 (H)  70 - 99 mg/dL Final   BUN 06/21/2022 21  6 - 23 mg/dL Final   Creatinine, Ser 06/21/2022 1.20  0.40 - 1.50 mg/dL Final   Total Bilirubin 06/21/2022 0.7  0.2 - 1.2 mg/dL Final   Alkaline Phosphatase 06/21/2022 19 (L)  39 - 117 U/L Final   AST 06/21/2022 25  0 - 37 U/L Final   ALT 06/21/2022 53  0 - 53 U/L Final   Total Protein 06/21/2022 6.8  6.0 - 8.3 g/dL Final   Albumin 06/21/2022 4.3  3.5 - 5.2 g/dL Final   GFR 06/21/2022 65.70  >60.00 mL/min Final   Calculated using the CKD-EPI Creatinine Equation (2021)   Calcium 06/21/2022 9.3  8.4 - 10.5 mg/dL Final   Hgb A1c MFr Bld 06/21/2022 6.5  4.6 - 6.5 % Final   Glycemic Control Guidelines for People with Diabetes:Non Diabetic:  <6%Goal of Therapy: <7%Additional Action Suggested:  >8%    Direct LDL 06/21/2022 90.0  mg/dL Final   Optimal:  <100 mg/dLNear or Above Optimal:  100-129 mg/dLBorderline High:  130-159 mg/dLHigh:  160-189 mg/dLVery High:  >190 mg/dL        Review of Systems  Last eye exam was 06/2020 with no retinopathy  Hypertension: This is monitored by his PCP  BP Readings from Last 3 Encounters:  06/25/22 132/72  02/21/22 (!) 148/84  02/10/22 (!) 144/80   Lab Results  Component Value Date   CREATININE 1.20 06/21/2022   CREATININE 1.29 12/11/2021   CREATININE 1.34 09/11/2021    Cardiac: Coronary calcium score is 69th percentile However his CT angiogram did not show any lesions or plaque    His liver functions are now more consistently normal  Lab Results  Component Value Date   ALT 53 06/21/2022   ALT 53 12/11/2021   ALT 62 (H) 09/11/2021   Unable to calculate Fibrosis 4 Score. Requires ALT, AST, and platelet count within the last 6 months.    PHYSICAL EXAM:  BP 132/72   Pulse 87   Ht _0  (1.88 m)   Wt 267 lb 6.4 oz (121.3 kg)   SpO2 98%   BMI 34.33 kg/m    ASSESSMENT:   DIABETES with obesity:  See history of present illness for detailed discussion of current diabetes management, blood sugar patterns and problems identified  His A1c is reasonably good but he has not lost weight Likely can do better with exercise and possibly diet Tends to have higher fasting readings  Hypertriglyceridemia: This is not well controlled and also has not lost weight   PLAN:   Trial of Mounjaro instead of Trulicity He was given written prescription To start with 5 mg and he will call for a refill after the first month, potentially 10 mg subsequently  Continue monitoring blood sugars by rotation at different times .  Does need to try and do more regular exercise up to 5 days a week Start taking Vascepa if covered for triglycerides clinically and how this works Can continue fenofibrate To check triglycerides again in the next visit  There are no Patient Instructions on file for this visit.  Elayne Snare 06/25/2022, 8:23 AM

## 2022-06-25 NOTE — Patient Instructions (Signed)
Exercise more

## 2022-07-05 DIAGNOSIS — E113293 Type 2 diabetes mellitus with mild nonproliferative diabetic retinopathy without macular edema, bilateral: Secondary | ICD-10-CM | POA: Diagnosis not present

## 2022-07-05 LAB — HM DIABETES EYE EXAM

## 2022-07-16 ENCOUNTER — Encounter: Payer: Self-pay | Admitting: Internal Medicine

## 2022-07-16 ENCOUNTER — Encounter: Payer: Self-pay | Admitting: Endocrinology

## 2022-07-21 ENCOUNTER — Encounter: Payer: Self-pay | Admitting: Endocrinology

## 2022-07-23 ENCOUNTER — Other Ambulatory Visit (HOSPITAL_COMMUNITY): Payer: Self-pay

## 2022-07-23 ENCOUNTER — Telehealth: Payer: Self-pay

## 2022-07-23 NOTE — Telephone Encounter (Signed)
Patient Advocate Encounter  Received notification from [facility] [place] that prior authorization is required for [facility] '5MG'$ /0.5ML pen-injectors. PA submitted and APPROVED on 07/23/2022.  Key [id number] Effective: 07/23/22 - 07/22/2023

## 2022-07-23 NOTE — Telephone Encounter (Signed)
Patient notified

## 2022-07-28 ENCOUNTER — Other Ambulatory Visit: Payer: Self-pay | Admitting: Internal Medicine

## 2022-08-16 ENCOUNTER — Other Ambulatory Visit: Payer: Self-pay | Admitting: Internal Medicine

## 2022-08-16 ENCOUNTER — Other Ambulatory Visit: Payer: Self-pay | Admitting: Endocrinology

## 2022-09-09 ENCOUNTER — Other Ambulatory Visit: Payer: BC Managed Care – PPO

## 2022-09-09 DIAGNOSIS — Z13220 Encounter for screening for lipoid disorders: Secondary | ICD-10-CM

## 2022-09-09 DIAGNOSIS — I10 Essential (primary) hypertension: Secondary | ICD-10-CM | POA: Diagnosis not present

## 2022-09-09 DIAGNOSIS — Z125 Encounter for screening for malignant neoplasm of prostate: Secondary | ICD-10-CM | POA: Diagnosis not present

## 2022-09-09 DIAGNOSIS — E119 Type 2 diabetes mellitus without complications: Secondary | ICD-10-CM | POA: Diagnosis not present

## 2022-09-10 LAB — CBC WITH DIFFERENTIAL/PLATELET
Absolute Monocytes: 402 cells/uL (ref 200–950)
Basophils Absolute: 39 cells/uL (ref 0–200)
Basophils Relative: 0.7 %
Eosinophils Absolute: 50 cells/uL (ref 15–500)
Eosinophils Relative: 0.9 %
HCT: 47.8 % (ref 38.5–50.0)
Hemoglobin: 16.7 g/dL (ref 13.2–17.1)
Lymphs Abs: 1689 cells/uL (ref 850–3900)
MCH: 32.7 pg (ref 27.0–33.0)
MCHC: 34.9 g/dL (ref 32.0–36.0)
MCV: 93.5 fL (ref 80.0–100.0)
MPV: 9.8 fL (ref 7.5–12.5)
Monocytes Relative: 7.3 %
Neutro Abs: 3322 cells/uL (ref 1500–7800)
Neutrophils Relative %: 60.4 %
Platelets: 235 10*3/uL (ref 140–400)
RBC: 5.11 10*6/uL (ref 4.20–5.80)
RDW: 12.9 % (ref 11.0–15.0)
Total Lymphocyte: 30.7 %
WBC: 5.5 10*3/uL (ref 3.8–10.8)

## 2022-09-10 LAB — COMPLETE METABOLIC PANEL WITHOUT GFR
AG Ratio: 1.8 (calc) (ref 1.0–2.5)
ALT: 49 U/L — ABNORMAL HIGH (ref 9–46)
AST: 24 U/L (ref 10–35)
Albumin: 4.3 g/dL (ref 3.6–5.1)
Alkaline phosphatase (APISO): 18 U/L — ABNORMAL LOW (ref 35–144)
BUN/Creatinine Ratio: 13 (calc) (ref 6–22)
BUN: 17 mg/dL (ref 7–25)
CO2: 31 mmol/L (ref 20–32)
Calcium: 9.6 mg/dL (ref 8.6–10.3)
Chloride: 102 mmol/L (ref 98–110)
Creat: 1.36 mg/dL — ABNORMAL HIGH (ref 0.70–1.35)
Globulin: 2.4 g/dL (ref 1.9–3.7)
Glucose, Bld: 112 mg/dL — ABNORMAL HIGH (ref 65–99)
Potassium: 4.5 mmol/L (ref 3.5–5.3)
Sodium: 139 mmol/L (ref 135–146)
Total Bilirubin: 0.8 mg/dL (ref 0.2–1.2)
Total Protein: 6.7 g/dL (ref 6.1–8.1)
eGFR: 60 mL/min/1.73m2

## 2022-09-10 LAB — LIPID PANEL
Cholesterol: 139 mg/dL (ref <200)
HDL: 29 mg/dL — ABNORMAL LOW (ref >=40)
LDL Cholesterol (Calc): 83 mg/dL (calc)
Non-HDL Cholesterol (Calc): 110 mg/dL (calc) (ref <130)
Total CHOL/HDL Ratio: 4.8 (calc) (ref <5.0)
Triglycerides: 169 mg/dL — ABNORMAL HIGH (ref <150)

## 2022-09-10 LAB — HEMOGLOBIN A1C
Hgb A1c MFr Bld: 6.7 %{Hb} — ABNORMAL HIGH
Mean Plasma Glucose: 146 mg/dL
eAG (mmol/L): 8.1 mmol/L

## 2022-09-10 LAB — PSA: PSA: 1.28 ng/mL

## 2022-09-13 ENCOUNTER — Other Ambulatory Visit: Payer: Managed Care, Other (non HMO)

## 2022-09-15 ENCOUNTER — Other Ambulatory Visit: Payer: Self-pay | Admitting: Internal Medicine

## 2022-09-17 ENCOUNTER — Encounter: Payer: Self-pay | Admitting: Internal Medicine

## 2022-09-17 ENCOUNTER — Ambulatory Visit (INDEPENDENT_AMBULATORY_CARE_PROVIDER_SITE_OTHER): Payer: BC Managed Care – PPO | Admitting: Internal Medicine

## 2022-09-17 VITALS — BP 134/74 | HR 62 | Temp 97.6°F | Ht 74.0 in | Wt 263.1 lb

## 2022-09-17 DIAGNOSIS — K219 Gastro-esophageal reflux disease without esophagitis: Secondary | ICD-10-CM | POA: Diagnosis not present

## 2022-09-17 DIAGNOSIS — Z0000 Encounter for general adult medical examination without abnormal findings: Secondary | ICD-10-CM

## 2022-09-17 DIAGNOSIS — E781 Pure hyperglyceridemia: Secondary | ICD-10-CM | POA: Diagnosis not present

## 2022-09-17 DIAGNOSIS — E119 Type 2 diabetes mellitus without complications: Secondary | ICD-10-CM | POA: Diagnosis not present

## 2022-09-17 DIAGNOSIS — I10 Essential (primary) hypertension: Secondary | ICD-10-CM | POA: Diagnosis not present

## 2022-09-17 LAB — POCT URINALYSIS DIPSTICK
Bilirubin, UA: NEGATIVE
Blood, UA: NEGATIVE
Glucose, UA: NEGATIVE
Ketones, UA: NEGATIVE
Leukocytes, UA: NEGATIVE
Nitrite, UA: NEGATIVE
Protein, UA: NEGATIVE
Spec Grav, UA: 1.015 (ref 1.010–1.025)
Urobilinogen, UA: 0.2 E.U./dL
pH, UA: 5 (ref 5.0–8.0)

## 2022-09-17 NOTE — Progress Notes (Signed)
Subjective:    Patient ID: Lance Beard , male    DOB: 15-Oct-1960, 62 y.o.    MRN: [id number]   62 y.o. male presents today for CPE.    He has cut back on work and is not working half time.  He has been compliant with Mounjaro weekly injections. He was on Trulicity until switched to [facility] a few weeks ago. He has tolerated the Mounjaro well and has seen more of a benefit with his appetite suppression with the new regimen. This is managed by endocrinology Dr. Elayne Snare. His last A1c was 6.7 on 09/09/22. He is scheduled to see Dr. Dwyane Dee for follow up next month.   In regards to his chronic kidney disease, his most recent creatinine was 1.36 and BUN was 17 on 09/09/22. He was fasting at the time of these labs.  He reports compliance with Lopressor '25mg'$  daily, Losartan '25mg'$  daily, and HCTZ 12.'5mg'$  daily. He is followed by cardiology and last saw Dr. Irish Lack on 09/05/21.   Patient reports compliance with Fenofibrate '145mg'$  daily. His last lipid panel on 09/09/22 revealed a total cholesterol of 139, HDL of 29, triglycerides of 169, and LDL of 83.  His last PSA was 1.28 on 09/09/22.  He reports some mild ringing in his left ear occasionally. He does not notice this if he is distracted with activities. This is chronic for him.    Lab Results  Component Value Date   PSA 1.28 09/09/2022   PSA 1.13 08/17/2020   PSA 1.1 08/13/2019      Family History  Problem Relation Age of Onset   Diabetes Father    Hypertension Father    CAD Father        Approximate age 59    Lung cancer Father    Kidney disease Father    Colon cancer Neg Hx    Esophageal cancer Neg Hx    Stomach cancer Neg Hx    Rectal cancer Neg Hx    Colon polyps Neg Hx     Past Medical History:  Diagnosis Date   Chronic kidney disease    kidney stones x2   Diabetes mellitus without complication ([place])    on meds   GERD (gastroesophageal reflux disease)    on meds   Hx of cardiovascular stress test    a. ETT-MV 3/14:   No scar or ischemia, EF 62%   Hyperlipidemia    on meds   Hypertension    on meds   PONV (postoperative nausea and vomiting)    with one surgery   Sleep apnea    uses CPAP     He is a [employer]. 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 Nonsmoker. Married. Does not consume alcohol. One daughter who lives in the [place] area.  Family Hx: Father  with history of diabetes, hypertension, coronary disease and renal failure.  Father died with complications of multiple lymphoma.  Mother deceased 10-31-2017 with history of respiratory problems.  Sister in good health.  Patient Care Team: Elby Showers, MD as PCP - General (Internal Medicine) Jettie Booze, MD as PCP - Cardiology (Cardiology) Lavonna Monarch, MD (Inactive) as Consulting Physician (Dermatology)   Review of Systems  Constitutional:  Negative for fever and malaise/fatigue.  HENT:  Positive for tinnitus (left ear). Negative for congestion.   Eyes:  Negative for blurred vision.  Respiratory:  Negative for cough and shortness of breath.  Cardiovascular:  Negative for chest pain, palpitations and leg swelling.  Gastrointestinal:  Negative for vomiting.  Musculoskeletal:  Negative for back pain.  Skin:  Negative for rash.  Neurological:  Negative for loss of consciousness and headaches.        Objective:   Vitals: BP 134/74   Pulse 62   Temp 97.6 F (36.4 C) (Tympanic)   Ht '5\' 2"'$  (1.575 m)   Wt 263 lb 1.9 oz (119.4 kg)   SpO2 97%   BMI 48.13 kg/m    Physical Exam Vitals and nursing note reviewed.  Constitutional:      General: He is awake. He is not in acute distress.    Appearance: Normal appearance. He is not ill-appearing or toxic-appearing.  HENT:     Head: Atraumatic.     Right Ear: Tympanic membrane normal.     Left Ear: Tympanic membrane normal.  Neck:     Thyroid: No thyroid mass, thyromegaly or thyroid tenderness.     Vascular: No  carotid bruit.  Cardiovascular:     Rate and Rhythm: Normal rate and regular rhythm. No extrasystoles are present.    Pulses:          Dorsalis pedis pulses are 2+ on the right side and 2+ on the left side.       Posterior tibial pulses are 1+ on the right side and 1+ on the left side.     Heart sounds: Normal heart sounds. No murmur heard. Pulmonary:     Effort: Pulmonary effort is normal.     Breath sounds: Normal breath sounds. No wheezing, rhonchi or rales.  Abdominal:     General: There is no distension.     Palpations: Abdomen is soft. There is no fluid wave, hepatomegaly, splenomegaly or mass.     Tenderness: There is no abdominal tenderness.  Musculoskeletal:     Cervical back: Normal range of motion.     Right lower leg: No edema.     Left lower leg: No edema.  Lymphadenopathy:     Cervical: No cervical adenopathy.  Skin:    General: Skin is warm.  Neurological:     General: No focal deficit present.     Mental Status: He is alert and oriented to person, place, and time. Mental status is at baseline.     Sensory: Sensation is intact.  Psychiatric:        Mood and Affect: Mood normal.        Behavior: Behavior normal. Behavior is cooperative.        Thought Content: Thought content normal.        Judgment: Judgment normal.          Assessment & Plan:   Hyperlipidemia, Hypertriglyceridemia: Continue Fenofibrate '145mg'$  daily.   Hypertension: Moderately controlled, Continue HCTZ 12.'5mg'$  daily, Lopressor '25mg'$  daily, and Losartan '25mg'$  daily.  Diabetes mellitus: Continue with Mounjaro as recommended by endocrinology Dr. Dwyane Dee. Patient has follow up with Dr. Dwyane Dee on 10/08/22.  Health maintenance: PSA WNL. His last colonoscopy was on 10/17/21.  Plan: Follow up in 1 year.   I,Alexis Herring,acting as a Education administrator for Elby Showers, MD.,have documented all relevant documentation on the behalf of Elby Showers, MD,as directed by  Elby Showers, MD while in the presence of Elby Showers, MD.   I, Elby Showers, MD, have reviewed all documentation for this visit. The documentation on 09/24/22 for the exam, diagnosis, procedures, and orders are all accurate  and complete.

## 2022-09-24 NOTE — Patient Instructions (Addendum)
He will continue follow-up with Dr. Dwyane Dee, endocrinologist.  Colonoscopy is up-to-date.  Labs reviewed and are stable.  Return in 1 year or as needed.  Immunizations discussed.

## 2022-09-27 ENCOUNTER — Other Ambulatory Visit: Payer: Self-pay | Admitting: Endocrinology

## 2022-09-27 ENCOUNTER — Other Ambulatory Visit: Payer: Self-pay | Admitting: Internal Medicine

## 2022-09-30 ENCOUNTER — Encounter: Payer: Self-pay | Admitting: Internal Medicine

## 2022-09-30 ENCOUNTER — Ambulatory Visit: Payer: BC Managed Care – PPO | Admitting: Internal Medicine

## 2022-09-30 ENCOUNTER — Telehealth: Payer: Self-pay | Admitting: Internal Medicine

## 2022-09-30 VITALS — BP 126/70 | HR 94 | Temp 98.7°F | Ht 74.0 in | Wt 267.4 lb

## 2022-09-30 DIAGNOSIS — M5431 Sciatica, right side: Secondary | ICD-10-CM | POA: Diagnosis not present

## 2022-09-30 DIAGNOSIS — E781 Pure hyperglyceridemia: Secondary | ICD-10-CM

## 2022-09-30 DIAGNOSIS — I10 Essential (primary) hypertension: Secondary | ICD-10-CM | POA: Diagnosis not present

## 2022-09-30 DIAGNOSIS — E119 Type 2 diabetes mellitus without complications: Secondary | ICD-10-CM

## 2022-09-30 MED ORDER — CYCLOBENZAPRINE HCL 10 MG PO TABS: 10.0000 mg | ORAL_TABLET | Freq: Three times a day (TID) | ORAL | 0 refills | Status: DC | PRN

## 2022-09-30 MED ORDER — HYDROCODONE-ACETAMINOPHEN 5-325 MG PO TABS: ORAL_TABLET | ORAL | 0 refills | Status: DC

## 2022-09-30 MED ORDER — METHYLPREDNISOLONE 4 MG PO TABS: ORAL_TABLET | ORAL | 0 refills | Status: DC

## 2022-09-30 NOTE — Telephone Encounter (Signed)
Lance Beard [phone]  Lance Beard called to say he has pulled his back again, he was hawling bag of feed yesterday and pulled it or strained it, he stated it is worse than before. It hurts to walk so he is just laying in bed. He said he done something like this several years ago, and was hoping you would just call something in for him, I let him know he would need some type of office visit. Looks like he may have had some type of back pain in 03/2020

## 2022-09-30 NOTE — Telephone Encounter (Signed)
scheduled

## 2022-09-30 NOTE — Progress Notes (Addendum)
Patient Care Team: Elby Showers, MD as PCP - General (Internal Medicine) Jettie Booze, MD as PCP - Cardiology (Cardiology) Lavonna Monarch, MD (Inactive) as Consulting Physician (Dermatology)  Visit Date: 09/30/22  Subjective:    Patient ID: Lance Beard , Male   DOB: [birth year], 62 y.o.    MRN: [id number]   61 y.o. Male presents today for acute back pain. Patient has a past medical history of chronic kidney disease, diabetes mellitus, GERD, hyperlipidemia, hypertension, sleep apnea.  Injured lower back while pulling a 50 lb bag of animal feed out of a car trunk. The pain is located in the center of his back and feels like muscle spasms. The pain goes through the front of his left leg. Reports pain upon sitting, standing, bending forward and backwards. Denies pain upon twisting. Prior back pain treated successfully with Prednisone, muscle relaxant and pain medications.   Past Medical History:  Diagnosis Date   Chronic kidney disease    kidney stones x2   Diabetes mellitus without complication ([place])    on meds   GERD (gastroesophageal reflux disease)    on meds   Hx of cardiovascular stress test    a. ETT-MV 3/14:  No scar or ischemia, EF 62%   Hyperlipidemia    on meds   Hypertension    on meds   PONV (postoperative nausea and vomiting)    with one surgery   Sleep apnea    uses CPAP     Family History  Problem Relation Age of Onset   Diabetes Father    Hypertension Father    CAD Father        Approximate age 74    Lung cancer Father    Kidney disease Father    Colon cancer Neg Hx    Esophageal cancer Neg Hx    Stomach cancer Neg Hx    Rectal cancer Neg Hx    Colon polyps Neg Hx     Social History   Social History Narrative   Not on file      Review of Systems  Constitutional:  Negative for fever and malaise/fatigue.  HENT:  Negative for congestion.   Eyes:  Negative for blurred vision.  Respiratory:  Negative for cough and shortness of  breath.   Cardiovascular:  Negative for chest pain, palpitations and leg swelling.  Gastrointestinal:  Negative for vomiting.  Musculoskeletal:  Positive for back pain (Center of back, down the left leg).  Skin:  Negative for rash.  Neurological:  Negative for loss of consciousness and headaches.        Objective:   Vitals: BP 126/70   Pulse 94   Temp 98.7 F (37.1 C) (Tympanic)   Ht '6\' 2"'$  (1.88 m)   Wt 267 lb 6.4 oz (121.3 kg)   SpO2 98%   BMI 34.33 kg/m    Physical Exam Constitutional:      General: He is not in acute distress.    Appearance: Normal appearance. He is not ill-appearing.  HENT:     Head: Normocephalic and atraumatic.  Pulmonary:     Effort: Pulmonary effort is normal.  Musculoskeletal:     Comments: Straight leg raising pain test positive at 90 degrees bilaterally. Muscle strength good.  Skin:    General: Skin is warm and dry.  Neurological:     Mental Status: He is alert and oriented to person, place, and time.     Deep Tendon Reflexes:  Reflex Scores:      Patellar reflexes are 2+ on the right side and 2+ on the left side.      Achilles reflexes are 0 on the right side and 0 on the left side. Psychiatric:        Mood and Affect: Mood normal.        Behavior: Behavior normal.        Thought Content: Thought content normal.        Judgment: Judgment normal.       Results:   Studies obtained and personally reviewed by me:    Labs:       Component Value Date/Time   NA 139 09/09/2022 0925   K 4.5 09/09/2022 0925   CL 102 09/09/2022 0925   CO2 31 09/09/2022 0925   GLUCOSE 112 (H) 09/09/2022 0925   BUN 17 09/09/2022 0925   CREATININE 1.36 (H) 09/09/2022 0925   CALCIUM 9.6 09/09/2022 0925   PROT 6.7 09/09/2022 0925   ALBUMIN 4.3 06/21/2022 0742   AST 24 09/09/2022 0925   ALT 49 (H) 09/09/2022 0925   ALKPHOS 19 (L) 06/21/2022 0742   BILITOT 0.8 09/09/2022 0925   GFRNONAA 63 02/19/2021 1242   GFRAA 73 02/19/2021 1242     Lab  Results  Component Value Date   WBC 5.5 09/09/2022   HGB 16.7 09/09/2022   HCT 47.8 09/09/2022   MCV 93.5 09/09/2022   PLT 235 09/09/2022    Lab Results  Component Value Date   CHOL 139 09/09/2022   HDL 29 (L) 09/09/2022   LDLCALC 83 09/09/2022   LDLDIRECT 90.0 06/21/2022   TRIG 169 (H) 09/09/2022   CHOLHDL 4.8 09/09/2022    Lab Results  Component Value Date   HGBA1C 6.7 (H) 09/09/2022     Lab Results  Component Value Date   TSH 2.35 12/11/2021     Lab Results  Component Value Date   PSA 1.28 09/09/2022   PSA 1.13 08/17/2020   PSA 1.1 08/13/2019      Assessment & Plan:   Back Pain: Prescribed Medrol tapering course as directed 4 mg (21 tabs) to take 6 tabs day 1 and decrease by one tab daily 6-5-4-3-2-1. Flexeril 10 mg tabs to take 3 times daily as needed, Norco 325 mg tabs (#15) one every 8 hours with food as needed for severe back pain. Refer to PT if pain does not improve.    I,Alexander Ruley,acting as a Education administrator for Elby Showers, MD.,have documented all relevant documentation on the behalf of Elby Showers, MD,as directed by  Elby Showers, MD while in the presence of Elby Showers, MD.   I, Elby Showers, MD, have reviewed all documentation for this visit. The documentation on 09/30/22 for the exam, diagnosis, procedures, and orders are all accurate and complete.

## 2022-10-03 ENCOUNTER — Other Ambulatory Visit (INDEPENDENT_AMBULATORY_CARE_PROVIDER_SITE_OTHER): Payer: BC Managed Care – PPO

## 2022-10-03 DIAGNOSIS — E1165 Type 2 diabetes mellitus with hyperglycemia: Secondary | ICD-10-CM

## 2022-10-03 DIAGNOSIS — E782 Mixed hyperlipidemia: Secondary | ICD-10-CM

## 2022-10-03 LAB — CBC
HCT: 50.1 % (ref 39.0–52.0)
Hemoglobin: 17.4 g/dL — ABNORMAL HIGH (ref 13.0–17.0)
MCHC: 34.7 g/dL (ref 30.0–36.0)
MCV: 93.3 fl (ref 78.0–100.0)
Platelets: 275 10*3/uL (ref 150.0–400.0)
RBC: 5.37 Mil/uL (ref 4.22–5.81)
RDW: 13.6 % (ref 11.5–15.5)
WBC: 8.8 10*3/uL (ref 4.0–10.5)

## 2022-10-03 LAB — COMPREHENSIVE METABOLIC PANEL
ALT: 44 U/L (ref 0–53)
AST: 25 U/L (ref 0–37)
Albumin: 4.4 g/dL (ref 3.5–5.2)
Alkaline Phosphatase: 16 U/L — ABNORMAL LOW (ref 39–117)
BUN: 25 mg/dL — ABNORMAL HIGH (ref 6–23)
CO2: 32 mEq/L (ref 19–32)
Calcium: 9.3 mg/dL (ref 8.4–10.5)
Chloride: 98 mEq/L (ref 96–112)
Creatinine, Ser: 1.36 mg/dL (ref 0.40–1.50)
GFR: 56.43 mL/min — ABNORMAL LOW (ref >60.00)
Glucose, Bld: 123 mg/dL — ABNORMAL HIGH (ref 70–99)
Potassium: 3.8 mEq/L (ref 3.5–5.1)
Sodium: 140 mEq/L (ref 135–145)
Total Bilirubin: 0.8 mg/dL (ref 0.2–1.2)
Total Protein: 7 g/dL (ref 6.0–8.3)

## 2022-10-03 LAB — LIPID PANEL
Cholesterol: 176 mg/dL (ref 0–200)
HDL: 27.6 mg/dL — ABNORMAL LOW (ref >39.00)
LDL Cholesterol: 111 mg/dL — ABNORMAL HIGH (ref 0–99)
NonHDL: 148.21
Total CHOL/HDL Ratio: 6
Triglycerides: 186 mg/dL — ABNORMAL HIGH (ref 0.0–149.0)
VLDL: 37.2 mg/dL (ref 0.0–40.0)

## 2022-10-03 LAB — HEMOGLOBIN A1C: Hgb A1c MFr Bld: 6.5 % (ref 4.6–6.5)

## 2022-10-08 ENCOUNTER — Ambulatory Visit: Payer: BC Managed Care – PPO | Admitting: Endocrinology

## 2022-10-08 ENCOUNTER — Encounter: Payer: Self-pay | Admitting: Endocrinology

## 2022-10-08 VITALS — BP 118/72 | HR 80 | Ht 74.0 in | Wt 263.0 lb

## 2022-10-08 DIAGNOSIS — E782 Mixed hyperlipidemia: Secondary | ICD-10-CM

## 2022-10-08 DIAGNOSIS — E1169 Type 2 diabetes mellitus with other specified complication: Secondary | ICD-10-CM

## 2022-10-08 DIAGNOSIS — E669 Obesity, unspecified: Secondary | ICD-10-CM | POA: Diagnosis not present

## 2022-10-08 MED ORDER — TIRZEPATIDE 7.5 MG/0.5ML ~~LOC~~ SOAJ: 7.5000 mg | SUBCUTANEOUS | 1 refills | Status: DC

## 2022-10-08 MED ORDER — ROSUVASTATIN CALCIUM 5 MG PO TABS: 5.0000 mg | ORAL_TABLET | Freq: Every day | ORAL | 3 refills | Status: DC

## 2022-10-08 NOTE — Patient Instructions (Signed)
Check blood sugars on waking up 2-3 days a week  Also check blood sugars about 2 hours after meals and do this after different meals by rotation  Recommended blood sugar levels on waking up are 90-130 and about 2 hours after meal is 130-160  Please bring your blood sugar monitor to each visit, thank you   

## 2022-10-08 NOTE — Progress Notes (Signed)
Patient ID: Lance Beard, male   DOB: Jan 05, 1961, 62 y.o.   MRN: [id number]           Referring physician: Tedra [place]  Chief complaint: Endocrinology follow-up  History of Present Illness:   HYPERLIPIDEMIA:  He was found to have hypertriglyceridemia around the year 2010 Initially was tried on TriCor but he felt that it was causing excessive sweating and he did not take it for long He was subsequently switched to atorvastatin  On review of his lipids as far back as 2012 indicate that he has not had hypercholesterolemia but mostly high triglyceride levels with low HDL  On his initial consultation his Lipitor was stopped and fenofibrate started  He has also seen the dietitian previously He is usually watching diet except over the holidays Does not drink alcohol  Triglycerides are relatively higher but under 200; HDL also low He was not able to get Vascepa covered and is taking OTC fish oil  LDL is previously controlled and now over 100  Lab Results  Component Value Date   CHOL 176 10/03/2022   CHOL 139 09/09/2022   CHOL 140 06/21/2022   Lab Results  Component Value Date   HDL 27.60 (L) 10/03/2022   HDL 29 (L) 09/09/2022   HDL 23.50 (L) 06/21/2022   Lab Results  Component Value Date   LDLCALC 111 (H) 10/03/2022   LDLCALC 83 09/09/2022   LDLCALC 80 12/11/2021   Lab Results  Component Value Date   TRIG 186.0 (H) 10/03/2022   TRIG 169 (H) 09/09/2022   TRIG 246.0 (H) 06/21/2022   Lab Results  Component Value Date   CHOLHDL 6 10/03/2022   CHOLHDL 4.8 09/09/2022   CHOLHDL 6 06/21/2022   Lab Results  Component Value Date   LDLDIRECT 90.0 06/21/2022   LDLDIRECT 80.0 12/07/2020    Problem 2: DIABETES  Date of diagnosis of type 2 diabetes mellitus: 2020       Background history:   He had prediabetes with A1c 6% in 2019 which was unchanged until 07/2020 when it went up to 6.7. He apparently was prescribed glipizide ER 2.5 mg daily in 05/2019 which was  continued until probably 2/22 He tried Metformin in 6/20 but may not have tolerated this because of diarrhea Since his A1c had gone up in 12/21 he was given Januvia 100 mg daily  Recent history:   Most recent A1c is 6.5   Non-insulin hypoglycemic drugs the patient is taking are: Mounjaro 5 mg weekly  Current management, blood sugar patterns and problems identified: He is taking Mounjaro since 11/23, previously was on Trulicity Compared to taking Trulicity he thinks his satiety is a little better with Mounjaro but he has not lost any weight  However his diet has been inconsistent over the last 2 to 3 months Also has not done any exercise lately Recent blood sugars looking fairly good with highest reading after dinner 172 and highest in the morning 136 No side effects with Mounjaro        Side effects from medications have been: Possible GI intolerance to Metformin     Typical meal intake: Breakfast is usually a bagel               Glucose monitoring:  done <1 times a day         Glucometer: One Touch .       Blood Glucose readings by time of day and averages from meter download:   PRE-MEAL Fasting  Lunch Dinner Bedtime Overall  Glucose range: 109-136      Mean/median: 124    131   POST-MEAL PC Breakfast PC Lunch PC Dinner  Glucose range:   105-172  Mean/median:   144   Previously:  PRE-MEAL Fasting Lunch Evenings  Bedtime Overall  Glucose range: 120-175  90-140  90-175  Mean/median: 139    129   Dietician visit, most recent: 11/2020  Wt Readings from Last 3 Encounters:  10/08/22 263 lb (119.3 kg)  09/30/22 267 lb 6.4 oz (121.3 kg)  09/17/22 263 lb 1.9 oz (119.4 kg)     Lab Results  Component Value Date   HGBA1C 6.5 10/03/2022   HGBA1C 6.7 (H) 09/09/2022   HGBA1C 6.5 06/21/2022   Lab Results  Component Value Date   MICROALBUR <0.7 06/21/2022   LDLCALC 111 (H) 10/03/2022   CREATININE 1.36 10/03/2022    Past Medical History:  Diagnosis Date   Chronic kidney  disease    kidney stones x2   Diabetes mellitus without complication ([facility])    on meds   GERD (gastroesophageal reflux disease)    on meds   Hx of cardiovascular stress test    a. ETT-MV 3/14:  No scar or ischemia, EF 62%   Hyperlipidemia    on meds   Hypertension    on meds   PONV (postoperative nausea and vomiting)    with one surgery   Sleep apnea    uses CPAP    Past Surgical History:  Procedure Laterality Date   APPENDECTOMY     COLONOSCOPY  2019   JP-MAC-suprep(exc)-4 polyps-TA and sessile   NASAL SEPTUM SURGERY     VARICOCELE EXCISION     WISDOM TOOTH EXTRACTION      Family History  Problem Relation Age of Onset   Diabetes Father    Hypertension Father    CAD Father        Approximate age 85    Lung cancer Father    Kidney disease Father    Colon cancer Neg Hx    Esophageal cancer Neg Hx    Stomach cancer Neg Hx    Rectal cancer Neg Hx    Colon polyps Neg Hx     Social History:  reports that he has never smoked. He has never used smokeless tobacco. He reports that he does not drink alcohol and does not use drugs.  Allergies: No Known Allergies  Allergies as of 10/08/2022   No Known Allergies      Medication List        Accurate as of October 08, 2022 10:28 AM. If you have any questions, ask your nurse or doctor.          STOP taking these medications    HYDROcodone-acetaminophen 5-325 MG tablet Commonly known as: Norco Stopped by: Elayne Snare, MD   methylPREDNISolone 4 MG tablet Commonly known as: Medrol Stopped by: Elayne Snare, MD       TAKE these medications    ALPRAZolam 0.25 MG tablet Commonly known as: XANAX TAKE 1 TABLET BY MOUTH TWICE A DAY AS NEEDED FOR ANXIETY   aspirin 81 MG tablet Take 81 mg by mouth daily.   B-complex with vitamin C tablet Take 1 tablet by mouth daily.   clobetasol cream 0.05 % Commonly known as: TEMOVATE APPLY 1 APPLICATION. TOPICALLY 2 (TWO) TIMES DAILY. What changed:  when to take  this reasons to take this   cyclobenzaprine 10 MG tablet Commonly known as:  FLEXERIL Take 1 tablet (10 mg total) by mouth 3 (three) times daily as needed for muscle spasms.   fenofibrate 145 MG tablet Commonly known as: TRICOR TAKE 1 TABLET BY MOUTH EVERY DAY   fexofenadine 180 MG tablet Commonly known as: ALLEGRA Take 1 tablet (180 mg total) by mouth daily. What changed:  when to take this reasons to take this   Fish Oil 1000 MG Caps Take 1 capsule by mouth daily at 6 (six) AM.   fluticasone 50 MCG/ACT nasal spray Commonly known as: FLONASE Place 1 spray into both nostrils daily. Begin by using 2 sprays in each nare daily for 3 to 5 days, then decrease to 1 spray in each nare daily.   hydrochlorothiazide 12.5 MG tablet Commonly known as: HYDRODIURIL TAKE 1 TABLET BY MOUTH EVERY DAY   losartan 25 MG tablet Commonly known as: COZAAR TAKE 1 TABLET (25 MG TOTAL) BY MOUTH DAILY.   metoprolol tartrate 25 MG tablet Commonly known as: LOPRESSOR TAKE 1 TABLET BY MOUTH EVERY DAY IN THE MORNING   Mounjaro 5 MG/0.5ML Pen Generic drug: tirzepatide INJECT 5MG INTO THE SKIN ONCE A WEEK   ONE TOUCH ULTRA 2 w/Device Kit SMARTSIG:1 Via Meter Every Night   OneTouch Delica Plus 0000000 Misc CHECK GLUCOSE TWICE A DAY   OneTouch Ultra test strip Generic drug: glucose blood CHECK GLUCOSE TWICE A DAY   pantoprazole 40 MG tablet Commonly known as: PROTONIX TAKE 1 TABLET BY MOUTH EVERY DAY   pramoxine-hydrocortisone 1-1 % rectal cream Commonly known as: PROCTOCREAM-HC Place 1 Application rectally 2 (two) times daily. What changed:  when to take this reasons to take this        LABS:  Lab on 10/03/2022  Component Date Value Ref Range Status   WBC 10/03/2022 8.8  4.0 - 10.5 K/uL Final   RBC 10/03/2022 5.37  4.22 - 5.81 Mil/uL Final   Platelets 10/03/2022 275.0  150.0 - 400.0 K/uL Final   Hemoglobin 10/03/2022 17.4 (H)  13.0 - 17.0 g/dL Final   HCT 10/03/2022 50.1   39.0 - 52.0 % Final   MCV 10/03/2022 93.3  78.0 - 100.0 fl Final   MCHC 10/03/2022 34.7  30.0 - 36.0 g/dL Final   RDW 10/03/2022 13.6  11.5 - 15.5 % Final   Cholesterol 10/03/2022 176  0 - 200 mg/dL Final   ATP III Classification       Desirable:  < 200 mg/dL               Borderline High:  200 - 239 mg/dL          High:  > = 240 mg/dL   Triglycerides 10/03/2022 186.0 (H)  0.0 - 149.0 mg/dL Final   Normal:  <150 mg/dLBorderline High:  150 - 199 mg/dL   HDL 10/03/2022 27.60 (L)  >39.00 mg/dL Final   VLDL 10/03/2022 37.2  0.0 - 40.0 mg/dL Final   LDL Cholesterol 10/03/2022 111 (H)  0 - 99 mg/dL Final   Total CHOL/HDL Ratio 10/03/2022 6   Final                  Men          Women1/2 Average Risk     3.4          3.3Average Risk          5.0          4.42X Average Risk  9.6          7.13X Average Risk          15.0          11.0                       NonHDL 10/03/2022 148.21   Final   NOTE:  Non-HDL goal should be 30 mg/dL higher than patient's LDL goal (i.e. LDL goal of < 70 mg/dL, would have non-HDL goal of < 100 mg/dL)   Sodium 10/03/2022 140  135 - 145 mEq/L Final   Potassium 10/03/2022 3.8  3.5 - 5.1 mEq/L Final   Chloride 10/03/2022 98  96 - 112 mEq/L Final   CO2 10/03/2022 32  19 - 32 mEq/L Final   Glucose, Bld 10/03/2022 123 (H)  70 - 99 mg/dL Final   BUN 10/03/2022 25 (H)  6 - 23 mg/dL Final   Creatinine, Ser 10/03/2022 1.36  0.40 - 1.50 mg/dL Final   Total Bilirubin 10/03/2022 0.8  0.2 - 1.2 mg/dL Final   Alkaline Phosphatase 10/03/2022 16 (L)  39 - 117 U/L Final   AST 10/03/2022 25  0 - 37 U/L Final   ALT 10/03/2022 44  0 - 53 U/L Final   Total Protein 10/03/2022 7.0  6.0 - 8.3 g/dL Final   Albumin 10/03/2022 4.4  3.5 - 5.2 g/dL Final   GFR 10/03/2022 56.43 (L)  >60.00 mL/min Final   Calculated using the CKD-EPI Creatinine Equation (2021)   Calcium 10/03/2022 9.3  8.4 - 10.5 mg/dL Final   Hgb A1c MFr Bld 10/03/2022 6.5  4.6 - 6.5 % Final   Glycemic Control Guidelines  for People with Diabetes:Non Diabetic:  <6%Goal of Therapy: <7%Additional Action Suggested:  >8%         Review of Systems  Last eye exam was 06/2020 with no retinopathy  Hypertension: This is monitored by his PCP  BP Readings from Last 3 Encounters:  10/08/22 118/72  09/30/22 126/70  09/17/22 134/74   Lab Results  Component Value Date   CREATININE 1.36 10/03/2022   CREATININE 1.36 (H) 09/09/2022   CREATININE 1.20 06/21/2022   Cardiac: Coronary calcium score is 69th percentile However his CT angiogram did not show any lesions or plaque    His liver functions are mostly normal  Lab Results  Component Value Date   ALT 44 10/03/2022   ALT 49 (H) 09/09/2022   ALT 53 06/21/2022   Unable to calculate Fibrosis 4 Score. Requires ALT, AST, and platelet count within the last 6 months.    PHYSICAL EXAM:  BP 118/72 (BP Location: Left Arm, Patient Position: Sitting, Cuff Size: Large)   Pulse 80   Ht 6' 2"$  (1.88 m)   Wt 263 lb (119.3 kg)   SpO2 96%   BMI 33.77 kg/m    ASSESSMENT:   DIABETES with obesity:  See history of present illness for detailed discussion of current diabetes management, blood sugar patterns and problems identified  His A1c is about the same as on his last visit at 6.5  Blood sugars are reasonably controlled although he is only on 5 mg Mounjaro now compared to 3 mg Trulicity  Lipids: See above, has high triglycerides and LDL now   PLAN:   Trial of Mounjaro 7.5 mg instead of 5 He thinks he can start improving his diet Also especially with spring coming up he should be able to start getting more active and needs  regular exercise Continue monitoring blood sugars at various times and discussed blood sugar targets  For lipids: She will continue fenofibrate and fish oil Crestor 5 mg daily to help with LDL control and hopefully triglycerides also, this will be relatively better for HDL compared to Lipitor To check lipids again in the next  visit  There are no Patient Instructions on file for this visit.  Elayne Snare 10/08/2022, 10:28 AM   For the last 2 weeks

## 2022-10-19 ENCOUNTER — Ambulatory Visit
Admission: EM | Admit: 2022-10-19 | Discharge: 2022-10-19 | Disposition: A | Discharge status: Home / Self Care | Payer: BC Managed Care – PPO | Attending: Internal Medicine | Admitting: Internal Medicine

## 2022-10-19 ENCOUNTER — Encounter: Payer: Self-pay | Admitting: Emergency Medicine

## 2022-10-19 DIAGNOSIS — J069 Acute upper respiratory infection, unspecified: Secondary | ICD-10-CM | POA: Diagnosis not present

## 2022-10-19 MED ORDER — AMOXICILLIN-POT CLAVULANATE 875-125 MG PO TABS: 1.0000 | ORAL_TABLET | Freq: Two times a day (BID) | ORAL | 0 refills | Status: DC

## 2022-10-19 NOTE — ED Provider Notes (Signed)
[facility]    CSN: [id number] Arrival date & time: 10/19/22  1128      History   Chief Complaint Chief Complaint  Patient presents with   Nasal Congestion    Had symptoms since Monday. Feels like a sinus infection based on my previous experience . - Entered by patient    HPI Lance Beard is a 62 y.o. male.   Patient presents with nasal congestion and sinus pressure that started about 5 days ago.  Patient reports very mild nonproductive cough that only occurs occasionally.  Reports cough is mainly due to nasal drainage in the back of the throat.  Patient reports that symptoms have seemed to worsen and nasal mucus is becoming thicker.  He has taken Zicam for symptoms with minimal improvement.  Reports history of recurrent sinus infections.  Denies any known sick contacts or fever.  Denies chest pain or shortness of breath.     Past Medical History:  Diagnosis Date   Chronic kidney disease    kidney stones x2   Diabetes mellitus without complication ([place])    on meds   GERD (gastroesophageal reflux disease)    on meds   Hx of cardiovascular stress test    a. ETT-MV 3/14:  No scar or ischemia, EF 62%   Hyperlipidemia    on meds   Hypertension    on meds   PONV (postoperative nausea and vomiting)    with one surgery   Sleep apnea    uses CPAP    Patient Active Problem List   Diagnosis Date Noted   History of kidney stones 05/01/2017   Fuchs' corneal dystrophy 08/11/2015   Obstructive sleep apnea 03/30/2015   Obesity, unspecified 02/17/2014   History of sleep apnea 02/17/2014   Impaired glucose tolerance 02/17/2014   Essential hypertension, benign 07/23/2013   Hypertriglyceridemia 05/23/2011    Past Surgical History:  Procedure Laterality Date   APPENDECTOMY     COLONOSCOPY  2019   JP-MAC-suprep(exc)-4 polyps-TA and sessile   NASAL SEPTUM SURGERY     VARICOCELE EXCISION     WISDOM TOOTH EXTRACTION         Home Medications    Prior to  Admission medications   Medication Sig Start Date End Date Taking? Authorizing Provider  ALPRAZolam (XANAX) 0.25 MG tablet TAKE 1 TABLET BY MOUTH TWICE A DAY AS NEEDED FOR ANXIETY 09/27/22  Yes Baxley, Cresenciano Lick, MD  amoxicillin-clavulanate (AUGMENTIN) 875-125 MG tablet Take 1 tablet by mouth every 12 (twelve) hours. 10/19/22  Yes Oswaldo Conroy E, FNP  aspirin 81 MG tablet Take 81 mg by mouth daily.   Yes [provider]  B Complex-C (B-COMPLEX WITH VITAMIN C) tablet Take 1 tablet by mouth daily.   Yes [provider]  Blood Glucose Monitoring Suppl (ONE TOUCH ULTRA 2) w/Device KIT SMARTSIG:1 Via Meter Every Night 08/25/20  Yes [provider]  clobetasol cream (TEMOVATE) [id number] % APPLY 1 APPLICATION. TOPICALLY 2 (TWO) TIMES DAILY. Patient taking differently: Apply 1 application  topically as needed. 01/22/22  Yes Lavonna Monarch, MD  cyclobenzaprine (FLEXERIL) 10 MG tablet Take 1 tablet (10 mg total) by mouth 3 (three) times daily as needed for muscle spasms. 09/30/22  Yes Elby Showers, MD  fenofibrate (TRICOR) 145 MG tablet TAKE 1 TABLET BY MOUTH EVERY DAY 09/27/22  Yes Elayne Snare, MD  fluticasone [facility]) 50 MCG/ACT nasal spray Place 1 spray into both nostrils daily. Begin by using 2 sprays in each nare  daily for 3 to 5 days, then decrease to 1 spray in each nare daily. 02/10/22  Yes Lynden Oxford Scales, PA-C  hydrochlorothiazide (HYDRODIURIL) 12.5 MG tablet TAKE 1 TABLET BY MOUTH EVERY DAY 04/02/22  Yes Baxley, Cresenciano Lick, MD  Lancets [facility] DELICA PLUS [id number]) MISC CHECK GLUCOSE TWICE A DAY 04/21/22  Yes Elby Showers, MD  losartan (COZAAR) 25 MG tablet TAKE 1 TABLET (25 MG TOTAL) BY MOUTH DAILY. 09/15/22  Yes Baxley, Cresenciano Lick, MD  metoprolol tartrate (LOPRESSOR) 25 MG tablet TAKE 1 TABLET BY MOUTH EVERY DAY IN THE MORNING 06/03/22  Yes Baxley, Cresenciano Lick, MD  Omega-3 Fatty Acids (FISH OIL) 1000 MG CAPS Take 1 capsule by mouth daily at 6 (six) AM.   Yes [provider]   ONETOUCH ULTRA test strip CHECK GLUCOSE TWICE A DAY 04/25/22  Yes Elby Showers, MD  pantoprazole (PROTONIX) 40 MG tablet TAKE 1 TABLET BY MOUTH EVERY DAY 04/02/22  Yes Baxley, Cresenciano Lick, MD  pramoxine-hydrocortisone (PROCTOCREAM-HC) 1-1 % rectal cream Place 1 Application rectally 2 (two) times daily. Patient taking differently: Place 1 Application rectally as needed. 03/01/22  Yes Baxley, Cresenciano Lick, MD  rosuvastatin (CRESTOR) 5 MG tablet Take 1 tablet (5 mg total) by mouth daily. 10/08/22  Yes Elayne Snare, MD  tirzepatide [facility]) 7.5 MG/0.5ML Pen Inject 7.5 mg into the skin once a week. 10/08/22  Yes Elayne Snare, MD  fexofenadine (ALLEGRA) 180 MG tablet Take 1 tablet (180 mg total) by mouth daily. Patient taking differently: Take 180 mg by mouth as needed. 02/10/22 08/09/22  Lynden Oxford Scales, PA-C    Family History Family History  Problem Relation Age of Onset   Diabetes Father    Hypertension Father    CAD Father        Approximate age 98    Lung cancer Father    Kidney disease Father    Colon cancer Neg Hx    Esophageal cancer Neg Hx    Stomach cancer Neg Hx    Rectal cancer Neg Hx    Colon polyps Neg Hx     Social History Social History   Tobacco Use   Smoking status: Never   Smokeless tobacco: Never  Vaping Use   Vaping Use: Never used  Substance Use Topics   Alcohol use: No   Drug use: No     Allergies   Patient has no known allergies.   Review of Systems Review of Systems Per HPI  Physical Exam Triage Vital Signs ED Triage Vitals  Enc Vitals Group     BP 10/19/22 1251 128/82     Pulse Rate 10/19/22 1251 77     Resp 10/19/22 1251 18     Temp 10/19/22 1251 98.4 F (36.9 C)     Temp Source 10/19/22 1251 Oral     SpO2 10/19/22 1251 96 %     Weight 10/19/22 1252 260 lb (117.9 kg)     Height 10/19/22 1252 '6\' 2"'$  (1.88 m)     Head Circumference --      Peak Flow --      Pain Score 10/19/22 1252 0     Pain Loc --      Pain Edu? --      Excl. in [place]? --     No data found.  Updated Vital Signs BP 128/82 (BP Location: Right Arm)   Pulse 77   Temp 98.4 F (36.9 C) (Oral)   Resp 18   Ht  $'6\' 2"'u$  (1.88 m)   Wt 260 lb (117.9 kg)   SpO2 96%   BMI 33.38 kg/m   Visual Acuity Right Eye Distance:   Left Eye Distance:   Bilateral Distance:    Right Eye Near:   Left Eye Near:    Bilateral Near:     Physical Exam Constitutional:      General: He is not in acute distress.    Appearance: Normal appearance. He is not toxic-appearing or diaphoretic.  HENT:     Head: Normocephalic and atraumatic.     Right Ear: Tympanic membrane and ear canal normal.     Left Ear: Tympanic membrane and ear canal normal.     Nose: Congestion present.     Mouth/Throat:     Mouth: Mucous membranes are moist.     Pharynx: No posterior oropharyngeal erythema.  Eyes:     Extraocular Movements: Extraocular movements intact.     Conjunctiva/sclera: Conjunctivae normal.     Pupils: Pupils are equal, round, and reactive to light.  Cardiovascular:     Rate and Rhythm: Normal rate and regular rhythm.     Pulses: Normal pulses.     Heart sounds: Normal heart sounds.  Pulmonary:     Effort: Pulmonary effort is normal. No respiratory distress.     Breath sounds: Normal breath sounds. No stridor. No wheezing, rhonchi or rales.  Abdominal:     General: Abdomen is flat. Bowel sounds are normal.     Palpations: Abdomen is soft.  Musculoskeletal:        General: Normal range of motion.     Cervical back: Normal range of motion.  Skin:    General: Skin is warm and dry.  Neurological:     General: No focal deficit present.     Mental Status: He is alert and oriented to person, place, and time. Mental status is at baseline.  Psychiatric:        Mood and Affect: Mood normal.        Behavior: Behavior normal.      UC Treatments / Results  Labs (all labs ordered are listed, but only abnormal results are displayed) Labs Reviewed - No data to  display  EKG   Radiology No results found.  Procedures Procedures (including critical care time)  Medications Ordered in UC Medications - No data to display  Initial Impression / Assessment and Plan / UC Course  I have reviewed the triage vital signs and the nursing notes.  Pertinent labs & imaging results that were available during my care of the patient were reviewed by me and considered in my medical decision making (see chart for details).     Patient's symptoms are most likely due to viral illness but patient is reporting worsening symptoms and thickening of nasal mucus which is concerning for secondary bacterial infection/sinus infection.  Therefore, will opt to treat with antibiotic today.  Patient had negative COVID test at home so will defer viral testing.  Advised supportive care and symptom management.  Advised strict return precautions.  Patient verbalized understanding and was agreeable with plan. Final Clinical Impressions(s) / UC Diagnoses   Final diagnoses:  Acute upper respiratory infection     Discharge Instructions      I am treating you with an antibiotic for upper respiratory infection. Please follow up if any symptoms persist or worsen.     ED Prescriptions     Medication Sig Dispense Auth. Provider   amoxicillin-clavulanate (AUGMENTIN) 875-125  MG tablet Take 1 tablet by mouth every 12 (twelve) hours. 14 tablet [place], Michele Rockers, [place]      PDMP not reviewed this encounter.   Teodora Medici, [place] 10/19/22 1321

## 2022-10-19 NOTE — Discharge Instructions (Signed)
I am treating you with an antibiotic for upper respiratory infection. Please follow up if any symptoms persist or worsen.

## 2022-10-19 NOTE — ED Triage Notes (Signed)
Patient c/o possible sinus infection x 5 days.  Patient is having slight cough, nasal drainage and congestion.  Patient has taken Zicam.

## 2022-11-19 ENCOUNTER — Ambulatory Visit: Payer: Managed Care, Other (non HMO) | Admitting: Dermatology

## 2022-11-24 ENCOUNTER — Other Ambulatory Visit: Payer: Self-pay | Admitting: Internal Medicine

## 2022-12-01 NOTE — Progress Notes (Unsigned)
Cardiology Office Note   Date:  12/02/2022   ID:  Lance Beard, DOB 03/05/1961, MRN [id number]  PCP:  Lance Beard, Lance Beard, Lance Beard    No chief complaint on file.  Coronary artery calcification  Wt Readings from Last 3 Encounters:  12/02/22 259 lb 3.2 oz (117.6 kg)  10/19/22 260 lb (117.9 kg)  10/08/22 263 lb (119.3 kg)       History of Present Illness: Lance Beard is a 62 y.o. male   with risk factors for CAD.   [place] [employer] [place] which is time consuming and a source of stress.   Records from May 2021 showed: "Chest pain and shortness of breath with exertion.  EKG is within normal limits today.   Family history of coronary artery disease in his father   Hyperlipidemia-on statin medication-lipid panel was normal in December   Hypertension-treated and stable   BMI 35-once again encourage diet exercise and weight loss   Impaired glucose tolerance-hemoglobin A1c in December 2020 was normal at 6% on glipizide.  Does not tolerate Metformin."   2021 CTA coronaries showed: "Coronary calcium score of 78.2. This was 69th percentile for age and sex matched control.   2. Normal coronary origin with right dominance.   3. Mild calcified plaque in the proximal LAD causing minimal stenosis (0-24%)   4. CAD-RADS 1. Minimal non-obstructive CAD (0-24%). Consider non-atherosclerotic causes of chest pain. Consider preventive therapy and risk factor modification."   In January 2023, "he has noticed that his HR is increased in general.  He ahs had several episodes when he wakes up early in the morning, that his HR is in the low 100s.  He can feel his heart beat but not any irregularity.   Walking has decreased.  He is trying to cut back on work. "   Still has not increased walking.  Still trying to cut back on work.    Denies : Chest pain. Dizziness. Leg edema. Nitroglycerin use. Orthopnea. Paroxysmal nocturnal dyspnea. Shortness of breath. Syncope.    Rare  palpitations.   Past Medical History:  Diagnosis Date   Chronic kidney disease    kidney stones x2   Diabetes mellitus without complication    on meds   GERD (gastroesophageal reflux disease)    on meds   Hx of cardiovascular stress test    a. ETT-MV 3/14:  No scar or ischemia, EF 62%   Hyperlipidemia    on meds   Hypertension    on meds   PONV (postoperative nausea and vomiting)    with one surgery   Sleep apnea    uses CPAP    Past Surgical History:  Procedure Laterality Date   APPENDECTOMY     COLONOSCOPY  2019   JP-MAC-suprep(exc)-4 polyps-TA and sessile   NASAL SEPTUM SURGERY     VARICOCELE EXCISION     WISDOM TOOTH EXTRACTION       Current Outpatient Medications  Medication Sig Dispense Refill   ALPRAZolam (XANAX) 0.25 MG tablet TAKE 1 TABLET BY MOUTH TWICE A DAY AS NEEDED FOR ANXIETY 60 tablet 0   aspirin 81 MG tablet Take 81 mg by mouth daily.     B Complex-C (B-COMPLEX WITH VITAMIN C) tablet Take 1 tablet by mouth daily.     Blood Glucose Monitoring Suppl (ONE TOUCH ULTRA 2) w/Device KIT SMARTSIG:1 Via Meter Every Night     clobetasol cream (TEMOVATE) 0.05 % APPLY 1 APPLICATION. TOPICALLY 2 (TWO) TIMES  DAILY. (Patient taking differently: Apply 1 application  topically as needed.) 60 g 6   fenofibrate (TRICOR) 145 MG tablet TAKE 1 TABLET BY MOUTH EVERY DAY 30 tablet 1   fluticasone (FLONASE) 50 MCG/ACT nasal spray Place 1 spray into both nostrils daily. Begin by using 2 sprays in each nare daily for 3 to 5 days, then decrease to 1 spray in each nare daily. 15.8 mL 2   hydrochlorothiazide (HYDRODIURIL) 12.5 MG tablet TAKE 1 TABLET BY MOUTH EVERY DAY 30 tablet 11   Lancets (ONETOUCH DELICA PLUS LANCET33G) MISC CHECK GLUCOSE TWICE A DAY 100 each PRN   losartan (COZAAR) 25 MG tablet TAKE 1 TABLET (25 MG TOTAL) BY MOUTH DAILY. 90 tablet 0   metoprolol tartrate (LOPRESSOR) 25 MG tablet TAKE 1 TABLET BY MOUTH EVERY DAY IN THE MORNING 90 tablet 0   Omega-3 Fatty Acids  (FISH OIL) 1000 MG CAPS Take 1 capsule by mouth daily at 6 (six) AM.     ONETOUCH ULTRA test strip CHECK GLUCOSE TWICE A DAY 100 strip prn   pantoprazole (PROTONIX) 40 MG tablet TAKE 1 TABLET BY MOUTH EVERY DAY 30 tablet 11   pramoxine-hydrocortisone (PROCTOCREAM-HC) 1-1 % rectal cream Place 1 Application rectally 2 (two) times daily. (Patient taking differently: Place 1 Application rectally as needed.) 30 g 0   rosuvastatin (CRESTOR) 5 MG tablet Take 1 tablet (5 mg total) by mouth daily. 90 tablet 3   tirzepatide (MOUNJARO) 7.5 MG/0.5ML Pen Inject 7.5 mg into the skin once a week. 6 mL 1   amoxicillin-clavulanate (AUGMENTIN) 875-125 MG tablet Take 1 tablet by mouth every 12 (twelve) hours. 14 tablet 0   cyclobenzaprine (FLEXERIL) 10 MG tablet Take 1 tablet (10 mg total) by mouth 3 (three) times daily as needed for muscle spasms. 30 tablet 0   fexofenadine (ALLEGRA) 180 MG tablet Take 1 tablet (180 mg total) by mouth daily. (Patient taking differently: Take 180 mg by mouth as needed.) 90 tablet 1   No current facility-administered medications for this visit.    Allergies:   Patient has no known allergies.    Social History:  The patient  reports that he has never smoked. He has never used smokeless tobacco. He reports that he does not drink alcohol and does not use drugs.   Family History:  The patient's family history includes CAD in his father; Diabetes in his father; Hypertension in his father; Kidney disease in his father; Lung cancer in his father.    ROS:  Please see the history of present illness.   Otherwise, review of systems are positive for stress from work.   All other systems are reviewed and negative.    PHYSICAL EXAM: VS:  BP 130/70   Pulse 77   Ht 6\' 2"  (1.88 m)   Wt 259 lb 3.2 oz (117.6 kg)   SpO2 95%   BMI 33.28 kg/m  , BMI Body mass index is 33.28 kg/m. GEN: Well nourished, well developed, in no acute distress HEENT: normal Neck: no JVD, carotid bruits, or  masses Cardiac: RRR; no murmurs, rubs, or gallops,no edema  Respiratory:  clear to auscultation bilaterally, normal work of breathing GI: soft, nontender, nondistended, + BS MS: no deformity or atrophy Skin: warm and dry, no rash Neuro:  Strength and sensation are intact Psych: euthymic mood, full affect   EKG:   The ekg ordered today demonstrates NSR, no ST changes   Recent Labs: 12/11/2021: TSH 2.35 10/03/2022: ALT 44; BUN 25; Creatinine,  Ser 1.36; Hemoglobin 17.4; Platelets 275.0; Potassium 3.8; Sodium 140   Lipid Panel    Component Value Date/Time   CHOL 176 10/03/2022 0811   TRIG 186.0 (H) 10/03/2022 0811   HDL 27.60 (L) 10/03/2022 0811   CHOLHDL 6 10/03/2022 0811   VLDL 37.2 10/03/2022 0811   LDLCALC 111 (H) 10/03/2022 0811   LDLCALC 83 09/09/2022 0925   LDLDIRECT 90.0 06/21/2022 0742     Other studies Reviewed: Additional studies/ records that were reviewed today with results demonstrating: February 2024 total cholesterol 176 HDL 27 LDL 111 triglycerides 186 C6C 6.5 creatinine 1.36.   ASSESSMENT AND PLAN:  CAD: No angina. COntinue aggressive secondary prevention.  RFs improving.  Palpitations: rare.  Self limited.  Hyperlipidemia: Continue fenofibrate.  Rosuvastatin added 2 months ago LDL target at least less than 100 and ideally closer to 70. Type 2 diabetes: Exercise target as noted below.  Whole food, plant-based diet.  High-fiber diet.  Mounjaro added. Hypertension: Low-salt diet.  Avoid processed foods.  The current medical regimen is effective;  continue present plan and medications. Family history of heart disease: No change in the family history.   Current medicines are reviewed at length with the patient today.  The patient concerns regarding his medicines were addressed.  The following changes have been made:  No change  Labs/ tests ordered today include:  No orders of the defined types were placed in this encounter.   Recommend 150 minutes/week of  aerobic exercise Low fat, low carb, high fiber diet recommended  Disposition:   FU in 1 year   Signed, Lance Muss, Lance Beard  12/02/2022 9:37 AM    [facility] [address] [place], [place], [place]  [address] Phone: [phone]; Fax: [phone]

## 2022-12-02 ENCOUNTER — Encounter: Payer: Self-pay | Admitting: Interventional Cardiology

## 2022-12-02 ENCOUNTER — Ambulatory Visit: Payer: BC Managed Care – PPO | Attending: Interventional Cardiology | Admitting: Interventional Cardiology

## 2022-12-02 VITALS — BP 130/70 | HR 77 | Ht 74.0 in | Wt 259.2 lb

## 2022-12-02 DIAGNOSIS — I2510 Atherosclerotic heart disease of native coronary artery without angina pectoris: Secondary | ICD-10-CM

## 2022-12-02 DIAGNOSIS — R002 Palpitations: Secondary | ICD-10-CM

## 2022-12-02 DIAGNOSIS — I2584 Coronary atherosclerosis due to calcified coronary lesion: Secondary | ICD-10-CM

## 2022-12-02 DIAGNOSIS — E782 Mixed hyperlipidemia: Secondary | ICD-10-CM

## 2022-12-02 DIAGNOSIS — E1159 Type 2 diabetes mellitus with other circulatory complications: Secondary | ICD-10-CM

## 2022-12-02 DIAGNOSIS — I10 Essential (primary) hypertension: Secondary | ICD-10-CM | POA: Diagnosis not present

## 2022-12-02 NOTE — Patient Instructions (Signed)
Medication Instructions:  Your physician recommends that you continue on your current medications as directed. Please refer to the Current Medication list given to you today.  *If you need a refill on your cardiac medications before your next appointment, please call your pharmacy*   Lab Work: none If you have labs (blood work) drawn today and your tests are completely normal, you will receive your results only by: MyChart Message (if you have MyChart) OR A paper copy in the mail If you have any lab test that is abnormal or we need to change your treatment, we will call you to review the results.   Testing/Procedures: none   Follow-Up: At [facility], you and your health needs are our priority.  As part of our continuing mission to provide you with exceptional heart care, we have created designated Provider Care Teams.  These Care Teams include your primary Cardiologist (physician) and Advanced Practice Providers (APPs -  Physician Assistants and Nurse Practitioners) who all work together to provide you with the care you need, when you need it.  We recommend signing up for the patient portal called "MyChart".  Sign up information is provided on this After Visit Summary.  MyChart is used to connect with patients for Virtual Visits (Telemedicine).  Patients are able to view lab/test results, encounter notes, upcoming appointments, etc.  Non-urgent messages can be sent to your provider as well.   To learn more about what you can do with MyChart, go to [email or web address].    Your next appointment:   12 month(s)  Provider:   Jayadeep Varanasi, MD     Other Instructions    

## 2022-12-03 ENCOUNTER — Other Ambulatory Visit: Payer: Self-pay | Admitting: Internal Medicine

## 2022-12-23 ENCOUNTER — Other Ambulatory Visit: Payer: Self-pay | Admitting: Internal Medicine

## 2023-01-23 ENCOUNTER — Other Ambulatory Visit: Payer: Self-pay | Admitting: Endocrinology

## 2023-01-28 ENCOUNTER — Other Ambulatory Visit: Payer: Self-pay | Admitting: Internal Medicine

## 2023-02-03 ENCOUNTER — Other Ambulatory Visit (INDEPENDENT_AMBULATORY_CARE_PROVIDER_SITE_OTHER): Payer: BC Managed Care – PPO

## 2023-02-03 DIAGNOSIS — E1169 Type 2 diabetes mellitus with other specified complication: Secondary | ICD-10-CM

## 2023-02-03 DIAGNOSIS — E782 Mixed hyperlipidemia: Secondary | ICD-10-CM | POA: Diagnosis not present

## 2023-02-03 DIAGNOSIS — E669 Obesity, unspecified: Secondary | ICD-10-CM

## 2023-02-03 LAB — LIPID PANEL
Cholesterol: 92 mg/dL (ref 0–200)
HDL: 24.8 mg/dL — ABNORMAL LOW (ref >39.00)
LDL Cholesterol: 37 mg/dL (ref 0–99)
NonHDL: 67.69
Total CHOL/HDL Ratio: 4
Triglycerides: 153 mg/dL — ABNORMAL HIGH (ref 0.0–149.0)
VLDL: 30.6 mg/dL (ref 0.0–40.0)

## 2023-02-03 LAB — COMPREHENSIVE METABOLIC PANEL
ALT: 32 U/L (ref 0–53)
AST: 20 U/L (ref 0–37)
Albumin: 4.4 g/dL (ref 3.5–5.2)
Alkaline Phosphatase: 15 U/L — ABNORMAL LOW (ref 39–117)
BUN: 24 mg/dL — ABNORMAL HIGH (ref 6–23)
CO2: 29 mEq/L (ref 19–32)
Calcium: 9.8 mg/dL (ref 8.4–10.5)
Chloride: 101 mEq/L (ref 96–112)
Creatinine, Ser: 1.21 mg/dL (ref 0.40–1.50)
GFR: 64.77 mL/min (ref >60.00)
Glucose, Bld: 119 mg/dL — ABNORMAL HIGH (ref 70–99)
Potassium: 4 mEq/L (ref 3.5–5.1)
Sodium: 139 mEq/L (ref 135–145)
Total Bilirubin: 0.8 mg/dL (ref 0.2–1.2)
Total Protein: 6.7 g/dL (ref 6.0–8.3)

## 2023-02-03 LAB — HEMOGLOBIN A1C: Hgb A1c MFr Bld: 5.9 % (ref 4.6–6.5)

## 2023-02-06 ENCOUNTER — Ambulatory Visit: Payer: BC Managed Care – PPO | Admitting: Endocrinology

## 2023-02-06 VITALS — BP 118/70 | HR 61 | Ht 74.0 in | Wt 255.2 lb

## 2023-02-06 DIAGNOSIS — E1165 Type 2 diabetes mellitus with hyperglycemia: Secondary | ICD-10-CM

## 2023-02-06 DIAGNOSIS — Z7985 Long-term (current) use of injectable non-insulin antidiabetic drugs: Secondary | ICD-10-CM | POA: Diagnosis not present

## 2023-02-06 DIAGNOSIS — E782 Mixed hyperlipidemia: Secondary | ICD-10-CM | POA: Diagnosis not present

## 2023-02-06 NOTE — Patient Instructions (Signed)
Check blood sugars on waking up 2-3 days a week  Also check blood sugars about 2 hours after meals and do this after different meals by rotation  Recommended blood sugar levels on waking up are 90-130 and about 2 hours after meal is 130-160  Please bring your blood sugar monitor to each visit, thank you   

## 2023-02-06 NOTE — Progress Notes (Signed)
Patient ID: Lance Beard, male   DOB: 05-03-1961, 62 y.o.   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 Initially was tried on TriCor but he felt that it was causing excessive sweating and he did not take it for long He was subsequently switched to atorvastatin  On review of his lipids as far back as 2012 indicate that he has not had hypercholesterolemia but mostly high triglyceride levels with low HDL  On his initial consultation his Lipitor was stopped and fenofibrate started  He has also seen the dietitian previously  Does not drink alcohol  Since LDL was 100 he is now taking Crestor 5 mg daily Triglycerides are relatively better also with this and again under 200; HDL also low He was not able to get Vascepa covered and is taking OTC fish oil  LDL is relatively low now  Lab Results  Component Value Date   CHOL 92 02/03/2023   CHOL 176 10/03/2022   CHOL 139 09/09/2022   Lab Results  Component Value Date   HDL 24.80 (L) 02/03/2023   HDL 27.60 (L) 10/03/2022   HDL 29 (L) 09/09/2022   Lab Results  Component Value Date   LDLCALC 37 02/03/2023   LDLCALC 111 (H) 10/03/2022   LDLCALC 83 09/09/2022   Lab Results  Component Value Date   TRIG 153.0 (H) 02/03/2023   TRIG 186.0 (H) 10/03/2022   TRIG 169 (H) 09/09/2022   Lab Results  Component Value Date   CHOLHDL 4 02/03/2023   CHOLHDL 6 10/03/2022   CHOLHDL 4.8 09/09/2022   Lab Results  Component Value Date   LDLDIRECT 90.0 06/21/2022   LDLDIRECT 80.0 12/07/2020    Problem 2: DIABETES  Date of diagnosis of type 2 diabetes mellitus: 2020       Background history:   He had prediabetes with A1c 6% in 2019 which was unchanged until 07/2020 when it went up to 6.7. He apparently was prescribed glipizide ER 2.5 mg daily in 05/2019 which was  continued until probably 2/22 He tried Metformin in 6/20 but may not have tolerated this because of diarrhea Since his A1c had gone up in 12/21 he was given Januvia 100 mg daily  Recent history:   Most recent A1c is 5.9 compared to 6.5   Non-insulin hypoglycemic drugs the patient is taking are: Mounjaro 7.5 mg weekly  Current management, blood sugar patterns and problems identified: He is taking Mounjaro since 11/23, previously was on Trulicity Now taking 7.5 mg weekly and with this his appetite is better suppressed Previously had not been losing weight but now this is done about 5 pounds  Also with his regarding he has been able to walk around 40 minutes on most days Overall blood sugars are looking excellent at home although not enough readings after dinner No side effects with Mounjaro        Side effects from medications have been: Possible GI intolerance to Metformin           Glucose monitoring:  done <1 times a day         Glucometer: One Touch .       Blood Glucose readings by time of day and averages from meter download:   PRE-MEAL Fasting Lunch Dinner Bedtime Overall  Glucose range: 106-141  87,  95  87-141  Mean/median:     113   POST-MEAL PC Breakfast PC Lunch PC Dinner  Glucose range:   109, 124  Mean/median:      Previously: PRE-MEAL Fasting Lunch Dinner Bedtime Overall  Glucose range: 109-136      Mean/median: 124    131   POST-MEAL PC Breakfast PC Lunch PC Dinner  Glucose range:   105-172  Mean/median:   144    Dietician visit, most recent: 11/2020  Wt Readings from Last 3 Encounters:  02/06/23 255 lb 3.2 oz (115.8 kg)  12/02/22 259 lb 3.2 oz (117.6 kg)  10/19/22 260 lb (117.9 kg)     Lab Results  Component Value Date   HGBA1C 5.9 02/03/2023   HGBA1C 6.5 10/03/2022   HGBA1C 6.7 (H) 09/09/2022   Lab Results  Component Value Date   MICROALBUR <0.7 06/21/2022   LDLCALC 37 02/03/2023   CREATININE 1.21 02/03/2023    Past Medical History:   Diagnosis Date   Chronic kidney disease    kidney stones x2   Diabetes mellitus without complication (HCC)    on meds   GERD (gastroesophageal reflux disease)    on meds   Hx of cardiovascular stress test    a. ETT-MV 3/14:  No scar or ischemia, EF 62%   Hyperlipidemia    on meds   Hypertension    on meds   PONV (postoperative nausea and vomiting)    with one surgery   Sleep apnea    uses CPAP    Past Surgical History:  Procedure Laterality Date   APPENDECTOMY     COLONOSCOPY  2019   JP-MAC-suprep(exc)-4 polyps-TA and sessile   NASAL SEPTUM SURGERY     VARICOCELE EXCISION     WISDOM TOOTH EXTRACTION      Family History  Problem Relation Age of Onset   Diabetes Father    Hypertension Father    CAD Father        Approximate age 22    Lung cancer Father    Kidney disease Father    Colon cancer Neg Hx    Esophageal cancer Neg Hx    Stomach cancer Neg Hx    Rectal cancer Neg Hx    Colon polyps Neg Hx     Social History:  reports that he has never smoked. He has never used smokeless tobacco. He reports that he does not drink alcohol and does not use drugs.  Allergies: No Known Allergies  Allergies as of 02/06/2023   No Known Allergies      Medication List        Accurate as of February 06, 2023  3:42 PM. If you have any questions, ask your nurse or doctor.          STOP taking these medications    amoxicillin-clavulanate 875-125 MG tablet Commonly known as: AUGMENTIN   cyclobenzaprine 10 MG tablet Commonly known as: FLEXERIL       TAKE these medications    ALPRAZolam 0.25 MG tablet Commonly known as: XANAX TAKE 1 TABLET BY MOUTH TWICE A DAY AS NEEDED FOR ANXIETY   aspirin 81 MG tablet Take 81 mg by mouth daily.   B-complex with vitamin C tablet Take 1 tablet by mouth daily.   clobetasol cream 0.05 % Commonly known as: TEMOVATE APPLY 1 APPLICATION. TOPICALLY 2 (TWO) TIMES DAILY. What changed:  when to take this reasons to take this    fenofibrate 145 MG tablet Commonly known  as: TRICOR TAKE 1 TABLET BY MOUTH EVERY DAY   fexofenadine 180 MG tablet Commonly known as: ALLEGRA Take 1 tablet (180 mg total) by mouth daily. What changed:  when to take this reasons to take this   Fish Oil 1000 MG Caps Take 1 capsule by mouth daily at 6 (six) AM.   fluticasone 50 MCG/ACT nasal spray Commonly known as: FLONASE Place 1 spray into both nostrils daily. Begin by using 2 sprays in each nare daily for 3 to 5 days, then decrease to 1 spray in each nare daily.   hydrochlorothiazide 25 MG tablet Commonly known as: HYDRODIURIL TAKE 1 TABLET (25 MG TOTAL) BY MOUTH DAILY.   losartan 25 MG tablet Commonly known as: COZAAR TAKE 1 TABLET (25 MG TOTAL) BY MOUTH DAILY.   metoprolol tartrate 25 MG tablet Commonly known as: LOPRESSOR TAKE 1 TABLET BY MOUTH EVERY DAY IN THE MORNING   ONE TOUCH ULTRA 2 w/Device Kit SMARTSIG:1 Via Meter Every Night   OneTouch Delica Plus Lancet33G Misc CHECK GLUCOSE TWICE A DAY   OneTouch Ultra test strip Generic drug: glucose blood CHECK GLUCOSE TWICE A DAY   pantoprazole 40 MG tablet Commonly known as: PROTONIX TAKE 1 TABLET BY MOUTH EVERY DAY   pramoxine-hydrocortisone 1-1 % rectal cream Commonly known as: PROCTOCREAM-HC Place 1 Application rectally 2 (two) times daily. What changed:  when to take this reasons to take this   rosuvastatin 5 MG tablet Commonly known as: Crestor Take 1 tablet (5 mg total) by mouth daily.   tirzepatide 7.5 MG/0.5ML Pen Commonly known as: MOUNJARO Inject 7.5 mg into the skin once a week.        LABS:  Lab on 02/03/2023  Component Date Value Ref Range Status   Cholesterol 02/03/2023 92  0 - 200 mg/dL Final   ATP III Classification       Desirable:  < 200 mg/dL               Borderline High:  200 - 239 mg/dL          High:  > = 161 mg/dL   Triglycerides 09/60/4540 153.0 (H)  0.0 - 149.0 mg/dL Final   Normal:  <981 mg/dLBorderline High:  150 -  199 mg/dL   HDL 19/14/7829 56.21 (L)  >30.86 mg/dL Final   VLDL 57/84/6962 30.6  0.0 - 40.0 mg/dL Final   LDL Cholesterol 02/03/2023 37  0 - 99 mg/dL Final   Total CHOL/HDL Ratio 02/03/2023 4   Final                  Men          Women1/2 Average Risk     3.4          3.3Average Risk          5.0          4.42X Average Risk          9.6          7.13X Average Risk          15.0          11.0                       NonHDL 02/03/2023 67.69   Final   NOTE:  Non-HDL goal should be 30 mg/dL higher than patient's LDL goal (i.e. LDL goal of < 70 mg/dL, would have non-HDL goal of < 100 mg/dL)  Sodium 02/03/2023 139  135 - 145 mEq/L Final   Potassium 02/03/2023 4.0  3.5 - 5.1 mEq/L Final   Chloride 02/03/2023 101  96 - 112 mEq/L Final   CO2 02/03/2023 29  19 - 32 mEq/L Final   Glucose, Bld 02/03/2023 119 (H)  70 - 99 mg/dL Final   BUN 16/05/9603 24 (H)  6 - 23 mg/dL Final   Creatinine, Ser 02/03/2023 1.21  0.40 - 1.50 mg/dL Final   Total Bilirubin 02/03/2023 0.8  0.2 - 1.2 mg/dL Final   Alkaline Phosphatase 02/03/2023 15 (L)  39 - 117 U/L Final   AST 02/03/2023 20  0 - 37 U/L Final   ALT 02/03/2023 32  0 - 53 U/L Final   Total Protein 02/03/2023 6.7  6.0 - 8.3 g/dL Final   Albumin 54/04/8118 4.4  3.5 - 5.2 g/dL Final   GFR 14/78/2956 64.77  >60.00 mL/min Final   Calculated using the CKD-EPI Creatinine Equation (2021)   Calcium 02/03/2023 9.8  8.4 - 10.5 mg/dL Final   Hgb O1H MFr Bld 02/03/2023 5.9  4.6 - 6.5 % Final   Glycemic Control Guidelines for People with Diabetes:Non Diabetic:  <6%Goal of Therapy: <7%Additional Action Suggested:  >8%         Review of Systems  Last eye exam was 11/23 with no retinopathy  Hypertension: This is monitored by his PCP  BP Readings from Last 3 Encounters:  02/06/23 118/70  12/02/22 130/70  10/19/22 128/82   Renal function history:  Lab Results  Component Value Date   CREATININE 1.21 02/03/2023   CREATININE 1.36 10/03/2022   CREATININE 1.36 (H)  09/09/2022   Cardiac: Coronary calcium score is 69th percentile However his CT angiogram did not show any lesions or plaque    His liver functions are recently normal  Lab Results  Component Value Date   ALT 32 02/03/2023   ALT 44 10/03/2022   ALT 49 (H) 09/09/2022   Unable to calculate Fibrosis 4 Score. Requires ALT, AST, and platelet count within the last 6 months.    PHYSICAL EXAM:  BP 118/70 (BP Location: Left Arm)   Pulse 61   Ht 6\' 2"  (1.88 m)   Wt 255 lb 3.2 oz (115.8 kg)   SpO2 98%   BMI 32.77 kg/m   Diabetic Foot Exam - Simple   Simple Foot Form Diabetic Foot exam was performed with the following findings: Yes   Visual Inspection No deformities, no ulcerations, no other skin breakdown bilaterally: Yes Sensation Testing Intact to touch and monofilament testing bilaterally: Yes Pulse Check Posterior Tibialis and Dorsalis pulse intact bilaterally: Yes Comments     ASSESSMENT:   DIABETES with obesity:  See history of present illness for detailed discussion of current diabetes management, blood sugar patterns and problems identified  His A1c is about the same as on his last visit at 6.5  Blood sugars are reasonably controlled although he is only on 5 mg Mounjaro now compared to 3 mg Trulicity  Lipids: Tolerating rosuvastatin in addition to fenofibrate and fish oil Lipids are excellent although HDL lower as expected   PLAN:   Continue Mounjaro 7.5 mg  He will try to be consistent with his diet and walking for exercise More blood sugar checks after dinner Follow-up in 4 months  For lipids: He will continue 3 drug treatment including rosuvastatin 5 mg fenofibrate and fish oil Triglycerides may improve further with continued weight loss  Patient Instructions  Check blood sugars  on waking up 2-3 days a week  Also check blood sugars about 2 hours after meals and do this after different meals by rotation  Recommended blood sugar levels on waking up  are 90-130 and about 2 hours after meal is 130-160  Please bring your blood sugar monitor to each visit, thank you   Reather Littler 02/06/2023, 3:42 PM   For the last 2 weeks

## 2023-03-03 ENCOUNTER — Other Ambulatory Visit: Payer: Self-pay | Admitting: Endocrinology

## 2023-03-03 ENCOUNTER — Other Ambulatory Visit: Payer: Self-pay | Admitting: Internal Medicine

## 2023-03-24 ENCOUNTER — Other Ambulatory Visit: Payer: Self-pay | Admitting: Endocrinology

## 2023-04-08 ENCOUNTER — Other Ambulatory Visit: Payer: Self-pay | Admitting: Internal Medicine

## 2023-04-24 ENCOUNTER — Other Ambulatory Visit: Payer: Self-pay | Admitting: Oncology

## 2023-04-24 DIAGNOSIS — Z006 Encounter for examination for normal comparison and control in clinical research program: Secondary | ICD-10-CM

## 2023-04-27 ENCOUNTER — Other Ambulatory Visit: Payer: Self-pay | Admitting: Internal Medicine

## 2023-04-27 DIAGNOSIS — E119 Type 2 diabetes mellitus without complications: Secondary | ICD-10-CM

## 2023-04-28 ENCOUNTER — Encounter: Payer: Self-pay | Admitting: Internal Medicine

## 2023-05-06 DIAGNOSIS — I10 Essential (primary) hypertension: Secondary | ICD-10-CM | POA: Diagnosis not present

## 2023-05-06 DIAGNOSIS — G4739 Other sleep apnea: Secondary | ICD-10-CM | POA: Diagnosis not present

## 2023-05-06 DIAGNOSIS — E119 Type 2 diabetes mellitus without complications: Secondary | ICD-10-CM | POA: Diagnosis not present

## 2023-05-28 ENCOUNTER — Other Ambulatory Visit: Payer: Self-pay | Admitting: Internal Medicine

## 2023-06-03 ENCOUNTER — Ambulatory Visit: Payer: BC Managed Care – PPO | Admitting: Endocrinology

## 2023-06-03 ENCOUNTER — Encounter: Payer: Self-pay | Admitting: Endocrinology

## 2023-06-03 VITALS — BP 115/63 | HR 72 | Ht 74.0 in | Wt 248.0 lb

## 2023-06-03 DIAGNOSIS — E119 Type 2 diabetes mellitus without complications: Secondary | ICD-10-CM

## 2023-06-03 DIAGNOSIS — E782 Mixed hyperlipidemia: Secondary | ICD-10-CM

## 2023-06-03 DIAGNOSIS — Z7985 Long-term (current) use of injectable non-insulin antidiabetic drugs: Secondary | ICD-10-CM | POA: Diagnosis not present

## 2023-06-03 LAB — POCT GLYCOSYLATED HEMOGLOBIN (HGB A1C): Hemoglobin A1C: 5.6 % (ref 4.0–5.6)

## 2023-06-03 NOTE — Progress Notes (Signed)
Outpatient Endocrinology Note [place] Windell Musson, MD  06/03/23  Patient's Name: Lance Beard    DOB: 01-10-1961    MRN: [id number]                                                    REASON OF VISIT: Follow-up of type 2 diabetes mellitus /hyperlipidemia  PCP: Margaree Mackintosh, MD  HISTORY OF PRESENT ILLNESS:   Lance Beard is a 62 y.o. old male with past medical history listed below, is here for follow up of type 2 diabetes mellitus.   Pertinent Diabetes History: Patient was diagnosed with type 2 diabetes mellitus in 2020.  He initially had prediabetes in 2019.  Chronic Diabetes Complications : Retinopathy: no. Last ophthalmology exam was done on annually. Nephropathy: no, on losartan. Peripheral neuropathy: no Coronary artery disease: no Stroke: no  Relevant comorbidities and cardiovascular risk factors: Obesity: yes Body mass index is 31.84 kg/m.  Hypertension: yes Hyperlipidemia. Yes, on a statin.  He was found to have hypertriglyceridemia around 2010.  He was initially tried on Tricor, was subsequently changed to atorvastatin, then to fenofibrate.  He has been taking rosuvastatin, fenofibrate and fish oil.  Current / Home Diabetic regimen includes: Mounjaro 7.5 mg weekly.  Prior diabetic medications: Metformin stopped due to diarrhea.  Was on Januvia in the past.  Was on glipizide extended release in the past.  Trulicity.  Glycemic data:   Not able to download glucometer in the clinic today.  Blood sugar reviewed from the meter directly as follows.  Almost all blood sugar at acceptable. 110 -125.   117, 86, 128, 116, 146, 112, 123, 84, 119, 115, 138.  Hypoglycemia: Patient has no hypoglycemic episodes. Patient has hypoglycemia awareness.  Factors modifying glucose control: 1.  Diabetic diet assessment: 2-3 meals a day.  2.  Staying active or exercising: walk 45 min a day, 5-6 days.   3.  Medication compliance: compliant all of the time.  Interval history 06/03/23 He  feels less hungry on initial days after Mounjaro injection, denies nausea and vomiting.  He has lost about 20 pounds of weight after being on Mounjaro.  He denies numbness and tingling of the feet.  No vision problem.  No other complaints today.  REVIEW OF SYSTEMS As per history of present illness.   PAST MEDICAL HISTORY: Past Medical History:  Diagnosis Date   Chronic kidney disease    kidney stones x2   Diabetes mellitus without complication (HCC)    on meds   GERD (gastroesophageal reflux disease)    on meds   Hx of cardiovascular stress test    a. ETT-MV 3/14:  No scar or ischemia, EF 62%   Hyperlipidemia    on meds   Hypertension    on meds   PONV (postoperative nausea and vomiting)    with one surgery   Sleep apnea    uses CPAP    PAST SURGICAL HISTORY: Past Surgical History:  Procedure Laterality Date   APPENDECTOMY     COLONOSCOPY  2019   JP-MAC-suprep(exc)-4 polyps-TA and sessile   NASAL SEPTUM SURGERY     VARICOCELE EXCISION     WISDOM TOOTH EXTRACTION      ALLERGIES: No Known Allergies  FAMILY HISTORY:  Family History  Problem Relation Age of Onset   Diabetes Father  Hypertension Father    CAD Father        Approximate age 5    Lung cancer Father    Kidney disease Father    Colon cancer Neg Hx    Esophageal cancer Neg Hx    Stomach cancer Neg Hx    Rectal cancer Neg Hx    Colon polyps Neg Hx     SOCIAL HISTORY: Social History   Socioeconomic History   Marital status: Married    Spouse name: Not on file   Number of children: 1   Years of education: Not on file   Highest education level: Not on file  Occupational History    Employer: [employer]    Comment: Accountant  Tobacco Use   Smoking status: Never   Smokeless tobacco: Never  Vaping Use   Vaping status: Never Used  Substance and Sexual Activity   Alcohol use: No   Drug use: No   Sexual activity: Not Currently  Other Topics Concern   Not on file  Social History  Narrative   Not on file   Social Determinants of Health   Financial Resource Strain: Not on file  Food Insecurity: Not on file  Transportation Needs: Not on file  Physical Activity: Not on file  Stress: Not on file  Social Connections: Not on file    MEDICATIONS:  Current Outpatient Medications  Medication Sig Dispense Refill   ALPRAZolam (XANAX) 0.25 MG tablet TAKE 1 TABLET BY MOUTH TWICE A DAY AS NEEDED FOR ANXIETY 60 tablet 0   aspirin 81 MG tablet Take 81 mg by mouth daily.     B Complex-C (B-COMPLEX WITH VITAMIN C) tablet Take 1 tablet by mouth daily.     Blood Glucose Monitoring Suppl (ONE TOUCH ULTRA 2) w/Device KIT SMARTSIG:1 Via Meter Every Night     clobetasol cream (TEMOVATE) 0.05 % APPLY 1 APPLICATION. TOPICALLY 2 (TWO) TIMES DAILY. (Patient taking differently: Apply 1 application  topically as needed.) 60 g 6   fenofibrate (TRICOR) 145 MG tablet TAKE 1 TABLET BY MOUTH EVERY DAY 30 tablet 3   hydrochlorothiazide (HYDRODIURIL) 25 MG tablet TAKE 1 TABLET (25 MG TOTAL) BY MOUTH DAILY. 30 tablet 1   Lancets (ONETOUCH DELICA PLUS LANCET33G) MISC CHECK GLUCOSE TWICE A DAY 100 each PRN   metoprolol tartrate (LOPRESSOR) 25 MG tablet TAKE 1 TABLET BY MOUTH EVERY DAY IN THE MORNING 90 tablet 0   Omega-3 Fatty Acids (FISH OIL) 1000 MG CAPS Take 1 capsule by mouth daily at 6 (six) AM.     ONETOUCH ULTRA test strip CHECK GLUCOSE TWICE A DAY 100 strip PRN   pantoprazole (PROTONIX) 40 MG tablet TAKE 1 TABLET BY MOUTH EVERY DAY 30 tablet 11   pramoxine-hydrocortisone (PROCTOCREAM-HC) 1-1 % rectal cream Place 1 Application rectally 2 (two) times daily. (Patient taking differently: Place 1 Application rectally as needed.) 30 g 0   rosuvastatin (CRESTOR) 5 MG tablet Take 1 tablet (5 mg total) by mouth daily. 90 tablet 3   tirzepatide (MOUNJARO) 7.5 MG/0.5ML Pen INJECT 7.5 MG SUBCUTANEOUSLY WEEKLY 6 mL 1   fexofenadine (ALLEGRA) 180 MG tablet Take 1 tablet (180 mg total) by mouth daily.  (Patient taking differently: Take 180 mg by mouth as needed.) 90 tablet 1   fluticasone (FLONASE) 50 MCG/ACT nasal spray Place 1 spray into both nostrils daily. Begin by using 2 sprays in each nare daily for 3 to 5 days, then decrease to 1 spray in each nare daily. 15.8  mL 2   losartan (COZAAR) 25 MG tablet TAKE 1 TABLET (25 MG TOTAL) BY MOUTH DAILY. 90 tablet 0   No current facility-administered medications for this visit.    PHYSICAL EXAM: Vitals:   06/03/23 1300  BP: 115/63  Pulse: 72  SpO2: 98%  Weight: 248 lb (112.5 kg)  Height: 6\' 2"  (1.88 m)   Body mass index is 31.84 kg/m.  Wt Readings from Last 3 Encounters:  06/03/23 248 lb (112.5 kg)  02/06/23 255 lb 3.2 oz (115.8 kg)  12/02/22 259 lb 3.2 oz (117.6 kg)    General: Well developed, well nourished male in no apparent distress.  HEENT: AT/[place], no external lesions.  Eyes: Conjunctiva clear and no icterus. Neck: Neck supple  Lungs: Respirations not labored Neurologic: Alert, oriented, normal speech Extremities / Skin: Dry.  Psychiatric: Does not appear depressed or anxious  Diabetic Foot Exam - Simple   No data filed    LABS Reviewed Lab Results  Component Value Date   HGBA1C 5.6 06/03/2023   HGBA1C 5.9 02/03/2023   HGBA1C 6.5 10/03/2022   No results found for: "FRUCTOSAMINE" Lab Results  Component Value Date   CHOL 92 02/03/2023   HDL 24.80 (L) 02/03/2023   LDLCALC 37 02/03/2023   LDLDIRECT 90.0 06/21/2022   TRIG 153.0 (H) 02/03/2023   CHOLHDL 4 02/03/2023   Lab Results  Component Value Date   MICRALBCREAT 0.6 06/21/2022   MICRALBCREAT 4 08/17/2020   Lab Results  Component Value Date   CREATININE 1.21 02/03/2023   Lab Results  Component Value Date   GFR 64.77 02/03/2023    ASSESSMENT / PLAN  1. Type 2 diabetes mellitus without complication, without long-term current use of insulin (HCC)   2. Mixed hyperlipidemia     Diabetes Mellitus type 2, complicated by no known complications. -  Diabetic status / severity: Controlled.  Lab Results  Component Value Date   HGBA1C 5.6 06/03/2023    - Hemoglobin A1c goal : <7%  - Medications: No change.  Discussed about potentially increasing Mounjaro for weight loss benefit.  Patient is having significant appetite suppression in the beginning of the injection and would like to continue on the same.  He has been walking almost daily to help with weight loss.  I) Mounjaro 7.5 mg subcutaneous weekly.  - Home glucose testing: Few times a week in the morning fasting. - Discussed/ Gave Hypoglycemia treatment plan.  # Consult : not required at this time.   # Annual urine for microalbuminuria/ creatinine ratio, no microalbuminuria currently, continue ACE/ARB /losartan.  Will check microalbumin creatinine ratio urine  Today. Last  Lab Results  Component Value Date   MICRALBCREAT 0.6 06/21/2022    # Foot check nightly.  # Annual dilated diabetic eye exams.   - Diet: Make healthy diabetic food choices - Life style / activity / exercise: Discussed.  2. Blood pressure  -  BP Readings from Last 1 Encounters:  06/03/23 115/63    - Control is in target.  - No change in current plans.  3. Lipid status / Hyperlipidemia - Last  Lab Results  Component Value Date   LDLCALC 37 02/03/2023   - Continue rosuvastatin 5 milligram daily.  Fenofibrate 145 mg daily.  This will daily. -Will check lipid panel and follow-up visit in 6 months.  Diagnoses and all orders for this visit:  Type 2 diabetes mellitus without complication, without long-term current use of insulin (HCC) -     POCT glycosylated  hemoglobin (Hb A1C) -     Microalbumin / creatinine urine ratio; Future -     Hemoglobin A1c; Future -     Basic metabolic panel; Future -     Microalbumin / creatinine urine ratio  Mixed hyperlipidemia -     Lipid panel; Future    DISPOSITION Follow up in clinic in 6 months suggested.   All questions answered and patient  verbalized understanding of the plan.  [place] Odester Nilson, MD [facility] [address] [place], [address] [place], [place] [address] Phone # [phone]  At least part of this note was generated using voice recognition software. Inadvertent word errors may have occurred, which were not recognized during the proofreading process.

## 2023-06-04 LAB — MICROALBUMIN / CREATININE URINE RATIO
Creatinine,U: 99.5 mg/dL
Microalb Creat Ratio: 0.7 mg/g (ref 0.0–30.0)
Microalb, Ur: 0.7 mg/dL (ref 0.0–1.9)

## 2023-07-02 ENCOUNTER — Encounter (HOSPITAL_BASED_OUTPATIENT_CLINIC_OR_DEPARTMENT_OTHER): Payer: Self-pay

## 2023-07-02 ENCOUNTER — Ambulatory Visit (HOSPITAL_BASED_OUTPATIENT_CLINIC_OR_DEPARTMENT_OTHER)
Admission: RE | Admit: 2023-07-02 | Discharge: 2023-07-02 | Disposition: A | Discharge status: Home / Self Care | Payer: BC Managed Care – PPO | Source: Ambulatory Visit | Attending: Internal Medicine | Admitting: Internal Medicine

## 2023-07-02 VITALS — BP 136/83 | HR 102 | Temp 98.3°F | Resp 18

## 2023-07-02 DIAGNOSIS — B349 Viral infection, unspecified: Secondary | ICD-10-CM

## 2023-07-02 NOTE — ED Provider Notes (Signed)
[facility]    CSN: [id number] Arrival date & time: 07/02/23  0819      History   Chief Complaint Chief Complaint  Patient presents with   Cough    Nasal congestion and runny nose that has evolved into a cough. Spitting up green phlegm. Been feeling gradually worse for about a week now. Feeling a little feverish but not recorded any fever. COVID test Monday was negative. - Entered by patient    HPI SHAHMEER BUNN is a 62 y.o. male.   The history is provided by the patient.  Cough Associated symptoms: rhinorrhea and sore throat   Associated symptoms: no chest pain, no ear pain, no headaches, no shortness of breath and no wheezing   Not feeling well for 1 week started with a scratchy throat, rhinorrhea and nasal congestion.  The drainage has been clear.  Developed a cough 2 days ago associated with occasional yellow sputum.  Felt feverish first day of illness resolved.  Nuys documented fever, chills, sweats, earache, headache, chest pain, shortness of breath, nausea, vomiting, diarrhea.  Denies known contacts for illness, no recent travel, non-smoker.  Medical history includes hypertension, diabetes, GERD, hyperlipidemia.  Taking Advil for symptoms.  No known drug allergies  Past Medical History:  Diagnosis Date   Chronic kidney disease    kidney stones x2   Diabetes mellitus without complication (HCC)    on meds   GERD (gastroesophageal reflux disease)    on meds   Hx of cardiovascular stress test    a. ETT-MV 3/14:  No scar or ischemia, EF 62%   Hyperlipidemia    on meds   Hypertension    on meds   PONV (postoperative nausea and vomiting)    with one surgery   Sleep apnea    uses CPAP    Patient Active Problem List   Diagnosis Date Noted   History of kidney stones 05/01/2017   Fuchs' corneal dystrophy 08/11/2015   Obstructive sleep apnea 03/30/2015   Obesity, unspecified 02/17/2014   History of sleep apnea 02/17/2014   Impaired glucose tolerance  02/17/2014   Essential hypertension, benign 07/23/2013   Hypertriglyceridemia 05/23/2011    Past Surgical History:  Procedure Laterality Date   APPENDECTOMY     COLONOSCOPY  2019   JP-MAC-suprep(exc)-4 polyps-TA and sessile   NASAL SEPTUM SURGERY     VARICOCELE EXCISION     WISDOM TOOTH EXTRACTION         Home Medications    Prior to Admission medications   Medication Sig Start Date End Date Taking? Authorizing Provider  ALPRAZolam Prudy Feeler) 0.25 MG tablet TAKE 1 TABLET BY MOUTH TWICE A DAY AS NEEDED FOR ANXIETY 03/03/23   Margaree Mackintosh, MD  aspirin 81 MG tablet Take 81 mg by mouth daily.    [provider]  B Complex-C (B-COMPLEX WITH VITAMIN C) tablet Take 1 tablet by mouth daily.    [provider]  Blood Glucose Monitoring Suppl (ONE TOUCH ULTRA 2) w/Device KIT SMARTSIG:1 Via Meter Every Night 08/25/20   [provider]  clobetasol cream (TEMOVATE) 0.05 % APPLY 1 APPLICATION. TOPICALLY 2 (TWO) TIMES DAILY. Patient taking differently: Apply 1 application  topically as needed. 01/22/22   Janalyn Harder, MD  fenofibrate (TRICOR) 145 MG tablet TAKE 1 TABLET BY MOUTH EVERY DAY 03/24/23   Reather Littler, MD  fexofenadine (ALLEGRA) 180 MG tablet Take 1 tablet (180 mg total) by mouth daily. Patient taking differently: Take 180 mg by  mouth as needed. 02/10/22 08/09/22  Theadora Rama Scales, PA-C  fluticasone (FLONASE) 50 MCG/ACT nasal spray Place 1 spray into both nostrils daily. Begin by using 2 sprays in each nare daily for 3 to 5 days, then decrease to 1 spray in each nare daily. 02/10/22   Theadora Rama Scales, PA-C  hydrochlorothiazide (HYDRODIURIL) 25 MG tablet TAKE 1 TABLET (25 MG TOTAL) BY MOUTH DAILY. 01/28/23   Margaree Mackintosh, MD  Lancets [facility] DELICA PLUS [place]) MISC CHECK GLUCOSE TWICE A DAY 04/28/23   Worthy Rancher B, FNP  losartan (COZAAR) 25 MG tablet TAKE 1 TABLET (25 MG TOTAL) BY MOUTH DAILY. 03/03/23   Margaree Mackintosh, MD  metoprolol tartrate  (LOPRESSOR) 25 MG tablet TAKE 1 TABLET BY MOUTH EVERY DAY IN THE MORNING 05/28/23   Margaree Mackintosh, MD  Omega-3 Fatty Acids (FISH OIL) 1000 MG CAPS Take 1 capsule by mouth daily at 6 (six) AM.    [provider]  [facility] ULTRA test strip CHECK GLUCOSE TWICE A DAY 04/28/23   Worthy Rancher B, FNP  pantoprazole (PROTONIX) 40 MG tablet TAKE 1 TABLET BY MOUTH EVERY DAY 04/08/23   Margaree Mackintosh, MD  pramoxine-hydrocortisone (PROCTOCREAM-HC) 1-1 % rectal cream Place 1 Application rectally 2 (two) times daily. Patient taking differently: Place 1 Application rectally as needed. 03/01/22   Margaree Mackintosh, MD  rosuvastatin (CRESTOR) 5 MG tablet Take 1 tablet (5 mg total) by mouth daily. 10/08/22   Reather Littler, MD  tirzepatide [facility]) 7.5 MG/0.5ML Pen INJECT 7.5 MG SUBCUTANEOUSLY WEEKLY 03/03/23   Reather Littler, MD    Family History Family History  Problem Relation Age of Onset   Diabetes Father    Hypertension Father    CAD Father        Approximate age 86    Lung cancer Father    Kidney disease Father    Colon cancer Neg Hx    Esophageal cancer Neg Hx    Stomach cancer Neg Hx    Rectal cancer Neg Hx    Colon polyps Neg Hx     Social History Social History   Tobacco Use   Smoking status: Never   Smokeless tobacco: Never  Vaping Use   Vaping status: Never Used  Substance Use Topics   Alcohol use: No   Drug use: No     Allergies   Patient has no known allergies.   Review of Systems Review of Systems  Constitutional:  Positive for fatigue.  HENT:  Positive for postnasal drip, rhinorrhea, sinus pressure and sore throat. Negative for ear pain, trouble swallowing and voice change.   Respiratory:  Positive for cough. Negative for shortness of breath and wheezing.   Cardiovascular:  Negative for chest pain.  Gastrointestinal:  Negative for diarrhea, nausea and vomiting.  Neurological:  Negative for dizziness and headaches.     Physical Exam Triage Vital Signs ED Triage Vitals   Encounter Vitals Group     BP 07/02/23 0830 136/83     Systolic BP Percentile --      Diastolic BP Percentile --      Pulse Rate 07/02/23 0830 (!) 102     Resp 07/02/23 0830 18     Temp 07/02/23 0830 98.3 F (36.8 C)     Temp Source 07/02/23 0830 Oral     SpO2 07/02/23 0830 96 %     Weight --      Height --      Head Circumference --  Peak Flow --      Pain Score 07/02/23 0831 0     Pain Loc --      Pain Education --      Exclude from Growth Chart --    No data found.  Updated Vital Signs BP 136/83 (BP Location: Right Arm)   Pulse (!) 102   Temp 98.3 F (36.8 C) (Oral)   Resp 18   SpO2 96%   Visual Acuity Right Eye Distance:   Left Eye Distance:   Bilateral Distance:    Right Eye Near:   Left Eye Near:    Bilateral Near:     Physical Exam Vitals and nursing note reviewed.  Constitutional:      Appearance: He is not ill-appearing.  HENT:     Head: Normocephalic and atraumatic.     Right Ear: Tympanic membrane and ear canal normal.     Left Ear: Tympanic membrane and ear canal normal.     Nose: Congestion present. No rhinorrhea.     Right Sinus: No maxillary sinus tenderness or frontal sinus tenderness.     Left Sinus: No maxillary sinus tenderness or frontal sinus tenderness.     Mouth/Throat:     Mouth: Mucous membranes are moist.  Eyes:     Conjunctiva/sclera: Conjunctivae normal.  Cardiovascular:     Rate and Rhythm: Normal rate and regular rhythm.     Heart sounds: Normal heart sounds.  Pulmonary:     Effort: Pulmonary effort is normal. No respiratory distress.     Breath sounds: Normal breath sounds. No wheezing, rhonchi or rales.  Musculoskeletal:     Cervical back: Neck supple.  Lymphadenopathy:     Cervical: No cervical adenopathy.  Skin:    General: Skin is warm and dry.  Neurological:     Mental Status: He is alert and oriented to person, place, and time.      UC Treatments / Results  Labs (all labs ordered are listed, but only  abnormal results are displayed) Labs Reviewed - No data to display  EKG   Radiology No results found.  Procedures Procedures (including critical care time)  Medications Ordered in UC Medications - No data to display  Initial Impression / Assessment and Plan / UC Course  I have reviewed the triage vital signs and the nursing notes.  Pertinent labs & imaging results that were available during my care of the patient were reviewed by me and considered in my medical decision making (see chart for details).     62 year old with URI symptoms x 1 week, reports productive cough denies chest pain, shortness of breath.  Has nasal congestion on exam otherwise exam is normal.  No evidence of bacterial infection.  No over-the-counter remedies tried.  Recommend Delsym or Coricidin HBP products for symptoms, increase fluids saline rinses.  Follow-up for new, worsening symptoms or concerns  Final Clinical Impressions(s) / UC Diagnoses   Final diagnoses:  None   Discharge Instructions   None    ED Prescriptions   None    PDMP not reviewed this encounter.   Meliton Rattan, [place] 07/02/23 [phone]

## 2023-07-02 NOTE — ED Triage Notes (Signed)
Pt c/o productive cough with yellow sputum, nasal congestion, and tickle in throat since Thursday. States taking OTC meds with no relief. States had a negative home COVID test.

## 2023-07-02 NOTE — Discharge Instructions (Signed)
Over the counter Delsym or Coricidin HBP for cough

## 2023-07-14 ENCOUNTER — Other Ambulatory Visit: Payer: Self-pay | Admitting: Internal Medicine

## 2023-07-17 ENCOUNTER — Encounter: Payer: Self-pay | Admitting: Nurse Practitioner

## 2023-07-17 ENCOUNTER — Ambulatory Visit: Payer: BC Managed Care – PPO | Admitting: Nurse Practitioner

## 2023-07-17 ENCOUNTER — Telehealth: Payer: Self-pay

## 2023-07-17 VITALS — BP 126/70 | HR 82 | Ht 74.0 in | Wt 257.6 lb

## 2023-07-17 DIAGNOSIS — H2513 Age-related nuclear cataract, bilateral: Secondary | ICD-10-CM | POA: Diagnosis not present

## 2023-07-17 DIAGNOSIS — G4733 Obstructive sleep apnea (adult) (pediatric): Secondary | ICD-10-CM

## 2023-07-17 DIAGNOSIS — H18513 Endothelial corneal dystrophy, bilateral: Secondary | ICD-10-CM | POA: Diagnosis not present

## 2023-07-17 DIAGNOSIS — D3132 Benign neoplasm of left choroid: Secondary | ICD-10-CM | POA: Diagnosis not present

## 2023-07-17 DIAGNOSIS — E113293 Type 2 diabetes mellitus with mild nonproliferative diabetic retinopathy without macular edema, bilateral: Secondary | ICD-10-CM | POA: Diagnosis not present

## 2023-07-17 LAB — HM DIABETES EYE EXAM

## 2023-07-17 NOTE — Patient Instructions (Signed)
Continue to use CPAP every night, minimum of 4-6 hours a night.  Change equipment as directed. Wash your tubing with warm soap and water daily, hang to dry. Wash humidifier portion weekly. Use bottled, distilled water and change daily Be aware of reduced alertness and do not drive or operate heavy machinery if experiencing this or drowsiness.  Exercise encouraged, as tolerated. Healthy weight management discussed.  Avoid or decrease alcohol consumption and medications that make you more sleepy, if possible. Notify if persistent daytime sleepiness occurs even with consistent use of PAP therapy.  Orders sent to medical supply company to re-establish and get new supplies  Follow up in one year with Katie Ammanda Dobbins,NP, or sooner, if needed

## 2023-07-17 NOTE — Progress Notes (Signed)
@Patient  ID: Lance Beard, male    DOB: 1960/12/16, 62 y.o.   MRN: [id number]  Chief Complaint  Patient presents with   Consult    Sleep Consult     Referring provider: Margaree Mackintosh, MD  HPI: 62 year old male, never smoker referred for sleep consult. Past medical history significant for HTN, OSA on CPAP, HLD.   TEST/EVENTS:  01/11/2020 PSG: AHI 18.5/h  07/17/2023: Today - sleep consult Patient presents today for sleep consult.  He was previously seen in our office years ago.  Last visit was in 2019.  He has a history of moderate sleep apnea and is on CPAP therapy.  Without CPAP, he has loud snoring, choking at night and feeling poorly rested.  Since he has been on CPAP, he gets good sleep at night and feels well rested when he wakes up in the morning.  Energy levels are good the next day.  Not having any issues with his current CPAP machine.  He is wanting to reestablish with a medical supply company.  Denies any issues with drowsy driving or morning headaches.  No significant leaks.  Mask fits well.  Wearing nasal pillow. Goes to bed around 10:30 PM.  Falls asleep within 10 minutes.  Wakes 2-3 times a night.  Usually gets up around 6:30 AM.  Does not operate any heavy machinery in his job field.  Weight is down 20 pounds over the last 2 years.  Still feels like pressure settings on CPAP were okay.  11 cm of water.  No supplemental oxygen. He has a history of high blood pressure and diabetes well-controlled on current medication regimen.  No history of stroke. He is a never smoker.  He does not drink alcohol.  No excessive caffeine intake.  Lives with his wife.  Retired but still works some.  No significant family history reported. Currently recovering from URI.  It is taking him about 2 weeks to feel better.  He finally feels like he is getting back to his normal.  Nasal congestion is resolving.  No issues with his breathing.  [place] 1  04/18/2023-07/16/2023: CPAP 11 cmH2O 90/90  day;s 100% >4 hr; average use 6 hr 52 min Leaks 95th 7.1 AHI 0.2  No Known Allergies  Immunization History  Administered Date(s) Administered   Influenza Inj Mdck Quad Pf 06/13/2016, 07/20/2019, 06/24/2022   Influenza Split 07/02/2013   Influenza,inj,Quad PF,6+ Mos 06/05/2018, 06/02/2019   Influenza-Unspecified 06/21/2014, 06/17/2015, 05/22/2017, 05/26/2020   Moderna Sars-Covid-2 Vaccination 09/30/2019, 10/26/2019, 06/30/2020   Pneumococcal Conjugate-13 08/23/2019   Tdap 05/26/2002, 11/18/2011   Unspecified SARS-COV-2 Vaccination 06/24/2022    Past Medical History:  Diagnosis Date   Chronic kidney disease    kidney stones x2   Diabetes mellitus without complication (HCC)    on meds   GERD (gastroesophageal reflux disease)    on meds   Hx of cardiovascular stress test    a. ETT-MV 3/14:  No scar or ischemia, EF 62%   Hyperlipidemia    on meds   Hypertension    on meds   PONV (postoperative nausea and vomiting)    with one surgery   Sleep apnea    uses CPAP    Tobacco History: Social History   Tobacco Use  Smoking Status Never  Smokeless Tobacco Never   Counseling given: Not Answered   Outpatient Medications Prior to Visit  Medication Sig Dispense Refill   ALPRAZolam (XANAX) 0.25 MG tablet TAKE 1 TABLET BY MOUTH  TWICE A DAY AS NEEDED FOR ANXIETY 60 tablet 0   aspirin 81 MG tablet Take 81 mg by mouth daily.     B Complex-C (B-COMPLEX WITH VITAMIN C) tablet Take 1 tablet by mouth daily.     Blood Glucose Monitoring Suppl (ONE TOUCH ULTRA 2) w/Device KIT SMARTSIG:1 Via Meter Every Night     clobetasol cream (TEMOVATE) 0.05 % APPLY 1 APPLICATION. TOPICALLY 2 (TWO) TIMES DAILY. (Patient taking differently: Apply 1 application  topically as needed.) 60 g 6   fenofibrate (TRICOR) 145 MG tablet TAKE 1 TABLET BY MOUTH EVERY DAY 30 tablet 3   Lancets (ONETOUCH DELICA PLUS LANCET33G) MISC CHECK GLUCOSE TWICE A DAY 100 each PRN   losartan (COZAAR) 25 MG tablet TAKE 1 TABLET  (25 MG TOTAL) BY MOUTH DAILY. 90 tablet 0   metoprolol tartrate (LOPRESSOR) 25 MG tablet TAKE 1 TABLET BY MOUTH EVERY DAY IN THE MORNING 90 tablet 0   Omega-3 Fatty Acids (FISH OIL) 1000 MG CAPS Take 1 capsule by mouth daily at 6 (six) AM.     ONETOUCH ULTRA test strip CHECK GLUCOSE TWICE A DAY 100 strip PRN   pantoprazole (PROTONIX) 40 MG tablet TAKE 1 TABLET BY MOUTH EVERY DAY 30 tablet 11   pramoxine-hydrocortisone (PROCTOCREAM-HC) 1-1 % rectal cream Place 1 Application rectally 2 (two) times daily. (Patient taking differently: Place 1 Application rectally as needed.) 30 g 0   rosuvastatin (CRESTOR) 5 MG tablet Take 1 tablet (5 mg total) by mouth daily. 90 tablet 3   tirzepatide (MOUNJARO) 7.5 MG/0.5ML Pen INJECT 7.5 MG SUBCUTANEOUSLY WEEKLY 6 mL 1   fexofenadine (ALLEGRA) 180 MG tablet Take 1 tablet (180 mg total) by mouth daily. (Patient taking differently: Take 180 mg by mouth as needed.) 90 tablet 1   fluticasone (FLONASE) 50 MCG/ACT nasal spray Place 1 spray into both nostrils daily. Begin by using 2 sprays in each nare daily for 3 to 5 days, then decrease to 1 spray in each nare daily. 15.8 mL 2   hydrochlorothiazide (HYDRODIURIL) 25 MG tablet TAKE 1 TABLET (25 MG TOTAL) BY MOUTH DAILY. 30 tablet 1   No facility-administered medications prior to visit.     Review of Systems:   Constitutional: No night sweats, fevers, chills, fatigue, or lassitude. +intentional weight loss HEENT: No headaches, difficulty swallowing, tooth/dental problems, or sore throat. No sneezing, itching, ear ache. +nasal congestion (improving) CV:  No chest pain, orthopnea, PND, swelling in lower extremities, anasarca, dizziness, palpitations, syncope Resp: No shortness of breath with exertion or at rest. No excess mucus or change in color of mucus. No productive or non-productive. No hemoptysis. No wheezing.  No chest wall deformity GI:  No heartburn, indigestion GU: No nocturia Skin: No rash, lesions,  ulcerations MSK:  No joint pain or swelling.   Neuro: No dizziness or lightheadedness.  Psych: No depression or anxiety. Mood stable.     Physical Exam:  BP 126/70 (BP Location: Right Arm, Cuff Size: Large)   Pulse 82   Ht 6\' 2"  (1.88 m)   Wt 257 lb 9.6 oz (116.8 kg)   SpO2 95%   BMI 33.07 kg/m   GEN: Pleasant, interactive, well-appearing; obese; in no acute distress HEENT:  Normocephalic and atraumatic. PERRLA. Sclera white. Nasal turbinates pink, moist and patent bilaterally. No rhinorrhea present. Oropharynx pink and moist, without exudate or edema. No lesions, ulcerations, or postnasal drip. Mallampati III NECK:  Supple w/ fair ROM. No JVD present. Normal carotid impulses w/o bruits.  Thyroid symmetrical with no goiter or nodules palpated. No lymphadenopathy.   CV: RRR, no m/r/g, no peripheral edema. Pulses intact, +2 bilaterally. No cyanosis, pallor or clubbing. PULMONARY:  Unlabored, regular breathing. Clear bilaterally A&P w/o wheezes/rales/rhonchi. No accessory muscle use.  GI: BS present and normoactive. Soft, non-tender to palpation. No organomegaly or masses detected.  MSK: No erythema, warmth or tenderness. Cap refil <2 sec all extrem. No deformities or joint swelling noted.  Neuro: A/Ox3. No focal deficits noted.   Skin: Warm, no lesions or rashe Psych: Normal affect and behavior. Judgement and thought content appropriate.     Lab Results:  CBC    Component Value Date/Time   WBC 8.8 10/03/2022 0811   RBC 5.37 10/03/2022 0811   HGB 17.4 (H) 10/03/2022 0811   HCT 50.1 10/03/2022 0811   PLT 275.0 10/03/2022 0811   MCV 93.3 10/03/2022 0811   MCH 32.7 09/09/2022 0925   MCHC 34.7 10/03/2022 0811   RDW 13.6 10/03/2022 0811   LYMPHSABS 1,689 09/09/2022 0925   MONOABS 513 04/16/2016 1121   EOSABS 50 09/09/2022 0925   BASOSABS 39 09/09/2022 0925    BMET    Component Value Date/Time   NA 139 02/03/2023 0818   K 4.0 02/03/2023 0818   CL 101 02/03/2023 0818    CO2 29 02/03/2023 0818   GLUCOSE 119 (H) 02/03/2023 0818   BUN 24 (H) 02/03/2023 0818   CREATININE 1.21 02/03/2023 0818   CREATININE 1.36 (H) 09/09/2022 0925   CALCIUM 9.8 02/03/2023 0818   GFRNONAA 63 02/19/2021 1242   GFRAA 73 02/19/2021 1242    BNP No results found for: "BNP"   Imaging:  No results found.  Administration History     None           No data to display          No results found for: "NITRICOXIDE"      Assessment & Plan:   Obstructive sleep apnea Moderate OSA, on CPAP.  Excellent compliance and control.  Receives benefit from use.  Understands proper care/use of device.  Will set him up with a DME company and renew order for supplies.  Machine is still in good working condition.  He does have a backup machine available to him at home if something happens to his current 1.  Aware of safe driving practices.  Healthy weight loss encouraged.  Patient Instructions  Continue to use CPAP every night, minimum of 4-6 hours a night.  Change equipment as directed. Wash your tubing with warm soap and water daily, hang to dry. Wash humidifier portion weekly. Use bottled, distilled water and change daily Be aware of reduced alertness and do not drive or operate heavy machinery if experiencing this or drowsiness.  Exercise encouraged, as tolerated. Healthy weight management discussed.  Avoid or decrease alcohol consumption and medications that make you more sleepy, if possible. Notify if persistent daytime sleepiness occurs even with consistent use of PAP therapy.  Orders sent to medical supply company to re-establish and get new supplies  Follow up in one year with Katie Jamill Wetmore,NP, or sooner, if needed    Advised if symptoms do not improve or worsen, to please contact office for sooner follow up or seek emergency care.   I spent 35 minutes of dedicated to the care of this patient on the date of this encounter to include pre-visit review of records,  face-to-face time with the patient discussing conditions above, post visit ordering of testing, clinical  documentation with the electronic health record, making appropriate referrals as documented, and communicating necessary findings to members of the patients care team.  Noemi Chapel, NP 07/17/2023  Pt aware and understands NP's role.

## 2023-07-17 NOTE — Assessment & Plan Note (Signed)
Moderate OSA, on CPAP.  Excellent compliance and control.  Receives benefit from use.  Understands proper care/use of device.  Will set him up with a DME company and renew order for supplies.  Machine is still in good working condition.  He does have a backup machine available to him at home if something happens to his current 1.  Aware of safe driving practices.  Healthy weight loss encouraged.  Patient Instructions  Continue to use CPAP every night, minimum of 4-6 hours a night.  Change equipment as directed. Wash your tubing with warm soap and water daily, hang to dry. Wash humidifier portion weekly. Use bottled, distilled water and change daily Be aware of reduced alertness and do not drive or operate heavy machinery if experiencing this or drowsiness.  Exercise encouraged, as tolerated. Healthy weight management discussed.  Avoid or decrease alcohol consumption and medications that make you more sleepy, if possible. Notify if persistent daytime sleepiness occurs even with consistent use of PAP therapy.  Orders sent to medical supply company to re-establish and get new supplies  Follow up in one year with Lance Kamryn Messineo,NP, or sooner, if needed

## 2023-07-17 NOTE — Telephone Encounter (Signed)
Pharmacy Patient Advocate Encounter   Received notification from CoverMyMeds that prior authorization for [facility] is required/requested.   Insurance verification completed.   The patient is insured through [facility] .   Per test claim: PA required; PA started via CoverMyMeds. KEY [id number] . Waiting for clinical questions to populate.

## 2023-07-18 ENCOUNTER — Other Ambulatory Visit (HOSPITAL_COMMUNITY): Payer: Self-pay

## 2023-08-08 ENCOUNTER — Other Ambulatory Visit: Payer: Self-pay | Admitting: Internal Medicine

## 2023-08-28 ENCOUNTER — Encounter: Payer: Self-pay | Admitting: Endocrinology

## 2023-08-29 ENCOUNTER — Other Ambulatory Visit: Payer: Self-pay

## 2023-08-29 DIAGNOSIS — E782 Mixed hyperlipidemia: Secondary | ICD-10-CM

## 2023-08-29 MED ORDER — FENOFIBRATE 145 MG PO TABS: 145.0000 mg | ORAL_TABLET | Freq: Every day | ORAL | 3 refills | Status: DC

## 2023-08-30 DIAGNOSIS — G4733 Obstructive sleep apnea (adult) (pediatric): Secondary | ICD-10-CM | POA: Diagnosis not present

## 2023-09-07 ENCOUNTER — Other Ambulatory Visit: Payer: Self-pay | Admitting: Internal Medicine

## 2023-09-10 ENCOUNTER — Encounter: Payer: Self-pay | Admitting: Endocrinology

## 2023-09-11 ENCOUNTER — Other Ambulatory Visit: Payer: Self-pay

## 2023-09-11 DIAGNOSIS — E119 Type 2 diabetes mellitus without complications: Secondary | ICD-10-CM

## 2023-09-11 MED ORDER — MOUNJARO 7.5 MG/0.5ML ~~LOC~~ SOAJ: SUBCUTANEOUS | 1 refills | Status: DC

## 2023-09-15 ENCOUNTER — Other Ambulatory Visit: Payer: BC Managed Care – PPO

## 2023-09-15 DIAGNOSIS — E119 Type 2 diabetes mellitus without complications: Secondary | ICD-10-CM | POA: Diagnosis not present

## 2023-09-15 DIAGNOSIS — Z0000 Encounter for general adult medical examination without abnormal findings: Secondary | ICD-10-CM | POA: Diagnosis not present

## 2023-09-15 DIAGNOSIS — I10 Essential (primary) hypertension: Secondary | ICD-10-CM | POA: Diagnosis not present

## 2023-09-15 DIAGNOSIS — E781 Pure hyperglyceridemia: Secondary | ICD-10-CM

## 2023-09-16 LAB — COMPLETE METABOLIC PANEL WITH GFR
AG Ratio: 2 (calc) (ref 1.0–2.5)
ALT: 34 U/L (ref 9–46)
AST: 18 U/L (ref 10–35)
Albumin: 4.5 g/dL (ref 3.6–5.1)
Alkaline phosphatase (APISO): 15 U/L — ABNORMAL LOW (ref 35–144)
BUN: 21 mg/dL (ref 7–25)
CO2: 29 mmol/L (ref 20–32)
Calcium: 9.9 mg/dL (ref 8.6–10.3)
Chloride: 102 mmol/L (ref 98–110)
Creat: 1.24 mg/dL (ref 0.70–1.35)
Globulin: 2.2 g/dL (ref 1.9–3.7)
Glucose, Bld: 107 mg/dL — ABNORMAL HIGH (ref 65–99)
Potassium: 4.7 mmol/L (ref 3.5–5.3)
Sodium: 139 mmol/L (ref 135–146)
Total Bilirubin: 0.8 mg/dL (ref 0.2–1.2)
Total Protein: 6.7 g/dL (ref 6.1–8.1)
eGFR: 66 mL/min/{1.73_m2} (ref >=60)

## 2023-09-16 LAB — LIPID PANEL
Cholesterol: 106 mg/dL (ref <200)
HDL: 26 mg/dL — ABNORMAL LOW (ref >=40)
LDL Cholesterol (Calc): 55 mg/dL
Non-HDL Cholesterol (Calc): 80 mg/dL (ref <130)
Total CHOL/HDL Ratio: 4.1 (calc) (ref <5.0)
Triglycerides: 178 mg/dL — ABNORMAL HIGH (ref <150)

## 2023-09-16 LAB — MICROALBUMIN / CREATININE URINE RATIO
Creatinine, Urine: 79 mg/dL (ref 20–320)
Microalb Creat Ratio: 3 mg/g{creat} (ref <30)
Microalb, Ur: 0.2 mg/dL

## 2023-09-16 LAB — CBC WITH DIFFERENTIAL/PLATELET
Absolute Lymphocytes: 1663 {cells}/uL (ref 850–3900)
Absolute Monocytes: 518 {cells}/uL (ref 200–950)
Basophils Absolute: 28 {cells}/uL (ref 0–200)
Basophils Relative: 0.4 %
Eosinophils Absolute: 62 {cells}/uL (ref 15–500)
Eosinophils Relative: 0.9 %
HCT: 50.3 % — ABNORMAL HIGH (ref 38.5–50.0)
Hemoglobin: 17 g/dL (ref 13.2–17.1)
MCH: 31.9 pg (ref 27.0–33.0)
MCHC: 33.8 g/dL (ref 32.0–36.0)
MCV: 94.4 fL (ref 80.0–100.0)
MPV: 10.2 fL (ref 7.5–12.5)
Monocytes Relative: 7.5 %
Neutro Abs: 4630 {cells}/uL (ref 1500–7800)
Neutrophils Relative %: 67.1 %
Platelets: 234 10*3/uL (ref 140–400)
RBC: 5.33 10*6/uL (ref 4.20–5.80)
RDW: 12.8 % (ref 11.0–15.0)
Total Lymphocyte: 24.1 %
WBC: 6.9 10*3/uL (ref 3.8–10.8)

## 2023-09-16 LAB — PSA: PSA: 1.91 ng/mL (ref <=4.00)

## 2023-09-16 LAB — HEMOGLOBIN A1C
Hgb A1c MFr Bld: 6.2 %{Hb} — ABNORMAL HIGH (ref <5.7)
Mean Plasma Glucose: 131 mg/dL
eAG (mmol/L): 7.3 mmol/L

## 2023-09-19 ENCOUNTER — Encounter: Payer: Self-pay | Admitting: Internal Medicine

## 2023-09-19 ENCOUNTER — Ambulatory Visit (INDEPENDENT_AMBULATORY_CARE_PROVIDER_SITE_OTHER): Payer: BC Managed Care – PPO | Admitting: Internal Medicine

## 2023-09-19 VITALS — BP 120/62 | HR 81 | Ht 74.0 in | Wt 254.0 lb

## 2023-09-19 DIAGNOSIS — E1159 Type 2 diabetes mellitus with other circulatory complications: Secondary | ICD-10-CM | POA: Insufficient documentation

## 2023-09-19 DIAGNOSIS — E119 Type 2 diabetes mellitus without complications: Secondary | ICD-10-CM | POA: Diagnosis not present

## 2023-09-19 DIAGNOSIS — K219 Gastro-esophageal reflux disease without esophagitis: Secondary | ICD-10-CM

## 2023-09-19 DIAGNOSIS — Z0000 Encounter for general adult medical examination without abnormal findings: Secondary | ICD-10-CM | POA: Diagnosis not present

## 2023-09-19 DIAGNOSIS — Z23 Encounter for immunization: Secondary | ICD-10-CM | POA: Diagnosis not present

## 2023-09-19 DIAGNOSIS — I10 Essential (primary) hypertension: Secondary | ICD-10-CM

## 2023-09-19 DIAGNOSIS — E781 Pure hyperglyceridemia: Secondary | ICD-10-CM | POA: Diagnosis not present

## 2023-09-19 DIAGNOSIS — R208 Other disturbances of skin sensation: Secondary | ICD-10-CM | POA: Diagnosis not present

## 2023-09-19 LAB — POCT URINALYSIS DIP (CLINITEK)
Bilirubin, UA: NEGATIVE
Blood, UA: NEGATIVE
Glucose, UA: NEGATIVE mg/dL
Ketones, POC UA: NEGATIVE mg/dL
Leukocytes, UA: NEGATIVE
Nitrite, UA: NEGATIVE
POC PROTEIN,UA: NEGATIVE
Spec Grav, UA: 1.01 (ref 1.010–1.025)
Urobilinogen, UA: 0.2 U/dL
pH, UA: 6.5 (ref 5.0–8.0)

## 2023-09-19 NOTE — Progress Notes (Addendum)
Annual Wellness Visit   Patient Care Team: Dorri Ozturk, Luanna Cole, MD as PCP - General (Internal Medicine) Corky Crafts, MD as PCP - Cardiology (Cardiology) Janalyn Harder, MD (Inactive) as Consulting Physician (Dermatology)  Visit Date: 09/19/23   Chief Complaint  Patient presents with   Annual Exam   Subjective:  Patient: Lance Beard, Male DOB: December 07, 1960, 63 y.o. MRN: [id number]  Lance Beard is a 63 y.o. Male who presents today for his Annual Wellness Visit. Patient has history of Essential Hypertension, Type 2 Diabetes Mellitus, Hypertriglyceridemia, and Gastroesophageal Reflux Disease.    Brings attention to periodic burning pain in his foot lasting about 10-15 minutes and is relieved by walking.   History of Hypertension treated with 25 mg Losartan daily and 25 mg Metoprolol tartrate daily. Blood Pressure: normotensive at 120/62.    History of Type 2 Diabetes Mellitus treated with 7.5 mg Mounjaro injected weekly. 09/15/23 HgbA1c, compared to 06/03/23: 6.2, elevated from 5.6; Blood Glucose 107, still elevated but decreased from 112. Was being followed by Reather Littler, MD with Endocrinology, but is now being seen by [place] Thapa, MD. UTD on Diabetes eye exam since 07/17/23 from [facility] with result of Retinopathy  History of Hypertriglyceridemia treated with 145 mg Fenofibrate daily and 5 mg Rosuvastatin daily. 09/15/23 Lipid Panel, compared to 02/03/23: HDL 26, low but increased from 24.80; TRIG 178, elevated from 153; otherwise WNL.   History of Gastroesophageal Reflux Disease treated with 40 mg Protonix daily.   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   Labs 09/15/23 CBC, compared to 10/03/22: HCT 50.3 slightly elevated from 50.1; otherwise WNL  CMP, compared to 09/09/22: Blood Glucose 107, still elevated but decreased from 112; APISO 15, low and decreased further from  18   Colonoscopy 10/17/21 with adenomatous 3 polyps removed - one 10 mm from sigmoid colon and two 3-4 mm from ascending colon; Diverticulosis in left and right colon; Otherwise normal on direct and retroflexion views with repeat recommendation of 04-Oct-2024  PSA 1.91 on 09/15/23  Vaccine Counseling: Due for PNA, Tdap, Shingles, Covid-19; UTD on Flu Past Medical History:  Diagnosis Date   Chronic kidney disease    kidney stones x2   Diabetes mellitus without complication (HCC)    on meds   GERD (gastroesophageal reflux disease)    on meds   Hx of cardiovascular stress test    a. ETT-MV 3/14:  No scar or ischemia, EF 62%   Hyperlipidemia    on meds   Hypertension    on meds   PONV (postoperative nausea and vomiting)    with one surgery   Sleep apnea    uses CPAP   Family History  Problem Relation Age of Onset   Diabetes Father    Hypertension Father    CAD Father        Approximate age 14    Lung cancer Father    Kidney disease Father    Colon cancer Neg Hx    Esophageal cancer Neg Hx    Stomach cancer Neg Hx    Rectal cancer Neg Hx    Colon polyps Neg Hx   Family History Narrative: Father with history of Diabetes, Hypertension, Coronary Disease, and Renal Failure; died with complications of Multiple Lymphoma.   Mother deceased 02-09-19with history of Respiratory Problems.   Sister in good health.  Social Hx: Married with  one adult daughter. 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 He serves as a [employer]. Nonsmoker. Does not consume alcohol. Has degree from [employer]. 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  Review of Systems  Constitutional:  Negative for chills, fever, malaise/fatigue and weight loss.  HENT:  Negative for hearing loss, sinus pain and sore throat.   Respiratory:  Negative for cough, hemoptysis and shortness of breath.   Cardiovascular:  Negative for chest pain, palpitations, leg swelling and PND.  Gastrointestinal:   Negative for abdominal pain, constipation, diarrhea, heartburn, nausea and vomiting.  Genitourinary:  Negative for dysuria, frequency and urgency.  Musculoskeletal:  Negative for back pain, myalgias and neck pain.       (+) Foot pain - burning, lasts 10-15 mins, relieved by walking   Skin:  Negative for itching and rash.  Neurological:  Negative for dizziness, tingling, seizures and headaches.  Endo/Heme/Allergies:  Negative for polydipsia.  Psychiatric/Behavioral:  Negative for depression. The patient is not nervous/anxious.     Objective:  Vitals: BP 120/62   Pulse 81   Ht 6\' 2"  (1.88 m)   Wt 254 lb (115.2 kg)   SpO2 96%   BMI 32.61 kg/m  Physical Exam Vitals and nursing note reviewed.  Constitutional:      General: He is awake. He is not in acute distress.    Appearance: Normal appearance. He is not ill-appearing or toxic-appearing.  HENT:     Head: Normocephalic and atraumatic.     Right Ear: Tympanic membrane, ear canal and external ear normal.     Left Ear: Tympanic membrane, ear canal and external ear normal.     Mouth/Throat:     Pharynx: Oropharynx is clear. No oropharyngeal exudate.     Comments:   Eyes:     Extraocular Movements: Extraocular movements intact.     Pupils: Pupils are equal, round, and reactive to light.  Neck:     Thyroid: No thyroid mass, thyromegaly or thyroid tenderness.     Vascular: No carotid bruit.  Cardiovascular:     Rate and Rhythm: Normal rate and regular rhythm. No extrasystoles are present.    Pulses:          Dorsalis pedis pulses are 1+ on the right side and 1+ on the left side.     Heart sounds: Normal heart sounds. No murmur heard.    No friction rub. No gallop.  Pulmonary:     Effort: Pulmonary effort is normal.     Breath sounds: Normal breath sounds. No decreased breath sounds, wheezing, rhonchi or rales.  Chest:     Chest wall: No mass.  Abdominal:     Palpations: Abdomen is soft. There is no hepatomegaly, splenomegaly or  mass.     Tenderness: There is no abdominal tenderness.     Hernia: No hernia is present.  Musculoskeletal:     Cervical back: Normal range of motion.     Right lower leg: No edema.     Left lower leg: No edema.     Comments: Right Upper Back: 1.5 cm cyst, likely sebaceous  Lymphadenopathy:     Cervical: No cervical adenopathy.     Upper Body:     Right upper body: No supraclavicular adenopathy.     Left upper body: No supraclavicular adenopathy.  Skin:    General: Skin is warm and dry.  Neurological:     General: No focal deficit present.     Mental  Status: He is alert and oriented to person, place, and time. Mental status is at baseline.     Cranial Nerves: Cranial nerves 2-12 are intact.     Sensory: Sensation is intact.     Motor: Motor function is intact.     Coordination: Coordination is intact.     Gait: Gait is intact.     Deep Tendon Reflexes: Reflexes are normal and symmetric.  Psychiatric:        Attention and Perception: Attention normal.        Mood and Affect: Mood normal.        Speech: Speech normal.        Behavior: Behavior normal. Behavior is cooperative.        Thought Content: Thought content normal.        Cognition and Memory: Cognition and memory normal.        Judgment: Judgment normal.   Most Recent Fall Risk Assessment:    09/30/2022    2:06 PM  Fall Risk   Falls in the past year? 0  Number falls in past yr: 0  Injury with Fall? 0  Risk for fall due to : No Fall Risks  Follow up Falls prevention discussed   Most Recent Depression Screenings:    09/30/2022    2:07 PM 09/17/2022   11:02 AM  PHQ 2/9 Scores  PHQ - 2 Score 0 0   Results:  Studies obtained and personally reviewed by me:  Diabetic Foot Exam - Simple   Simple Foot Form Diabetic Foot exam was performed with the following findings: Yes 09/19/2023 10:15 AM  Visual Inspection No deformities, no ulcerations, no other skin breakdown bilaterally: Yes Sensation Testing Intact to touch  and monofilament testing bilaterally: Yes Pulse Check Posterior Tibialis and Dorsalis pulse intact bilaterally: Yes Comments    Labs:     Component Value Date/Time   NA 139 09/15/2023 0908   K 4.7 09/15/2023 0908   CL 102 09/15/2023 0908   CO2 29 09/15/2023 0908   GLUCOSE 107 (H) 09/15/2023 0908   BUN 21 09/15/2023 0908   CREATININE 1.24 09/15/2023 0908   CALCIUM 9.9 09/15/2023 0908   PROT 6.7 09/15/2023 0908   ALBUMIN 4.4 02/03/2023 0818   AST 18 09/15/2023 0908   ALT 34 09/15/2023 0908   ALKPHOS 15 (L) 02/03/2023 0818   BILITOT 0.8 09/15/2023 0908   GFRNONAA 63 02/19/2021 1242   GFRAA 73 02/19/2021 1242    Lab Results  Component Value Date   WBC 6.9 09/15/2023   HGB 17.0 09/15/2023   HCT 50.3 (H) 09/15/2023   MCV 94.4 09/15/2023   PLT 234 09/15/2023   Lab Results  Component Value Date   CHOL 106 09/15/2023   HDL 26 (L) 09/15/2023   LDLCALC 55 09/15/2023   LDLDIRECT 90.0 06/21/2022   TRIG 178 (H) 09/15/2023   CHOLHDL 4.1 09/15/2023   Lab Results  Component Value Date   HGBA1C 6.2 (H) 09/15/2023    Lab Results  Component Value Date   TSH 2.35 12/11/2021    Lab Results  Component Value Date   PSA 1.91 09/15/2023   PSA 1.28 09/09/2022   PSA 1.13 08/17/2020     Assessment & Plan:  POCT URINALYSIS DIP (CLINITEK) ordered today  Other Labs Reviewed today:  CBC, compared to 10/03/22: HCT 50.3 slightly elevated from 50.1; otherwise WNL  CMP, compared to 09/09/22: Blood Glucose 107, still elevated but decreased from 112; APISO 15, low and decreased  further from 18   Foot Issue: periodic burning pain lasting about 10-15 minutes and relieved by walking. Recommended to monitor pain and let us know if symptoms worsen.  May have some diabetic neuropathy.Could try Neurontin if persistent and problematic.  Hypertension treated with 25 mg Losartan daily and 25 mg Metoprolol tartrate daily. Blood Pressure: normotensive at 120/62.    Type 2 Diabetes Mellitus treated with  7.5 mg Mounjaro injected weekly. 09/15/23 HgbA1c, compared to 06/03/23: 6.2, elevated from 5.6; Blood Glucose 107, still elevated but decreased from 112. Was being followed by [facility].  UTD on Diabetes eye exam since 07/17/23 from [facility] with result of Retinopathy. Diabetic foot exam performed today.   Hypertriglyceridemia treated with 145 mg Fenofibrate daily and 5 mg Rosuvastatin daily. 09/15/23 Lipid Panel, compared to 02/03/23: HDL 26, low but increased from 24.80; TRIG 178, elevated from 153; otherwise WNL.   Gastroesophageal Reflux Disease treated with 40 mg Protonix daily.   Chronic Kidney Disease with 09/15/23 Microalbumin/Creatinine WNL with Creatinine at 79; Microalb at 0.2; Microalb Creat Ratio at 3.   Colonoscopy 10/17/21 with adenomatous 3 polyps removed - one 10 mm from sigmoid colon and two 3-4 mm from ascending colon; Diverticulosis in left and right colon; Otherwise normal on direct and retroflexion views with repeat recommendation of 2026  PSA 1.91 on 09/15/23  Vaccine Counseling: Received PNA vaccination today. Due for Tdap, Shingles, Covid-19; UTD on Flu. Discussed importance of vaccinations he is due for. He is agreeable to receiving Tdap and Shingles on his own time.    Annual wellness visit done today including the all of the following: Reviewed patient's Family Medical History Reviewed and updated list of patient's medical providers Assessment of cognitive impairment was done Assessed patient's functional ability Established a written schedule for health screening services Health Risk Assessent Completed and Reviewed  Discussed health benefits of physical activity, and encouraged him to engage in regular exercise appropriate for his age and condition.    I,Emily Lagle,acting as a Neurosurgeon for Margaree Mackintosh, MD.,have documented all relevant documentation on the behalf of Margaree Mackintosh, MD,as directed by  Margaree Mackintosh, MD while in the presence of Margaree Mackintosh, MD.   I, Margaree Mackintosh, MD, have reviewed all documentation for this visit. The documentation on 09/19/23 for the exam, diagnosis, procedures, and orders are all accurate and complete.

## 2023-09-19 NOTE — Patient Instructions (Signed)
It was a pleasure to see you today as always.  Continue close endocrinology follow-up.  Please speak with them if burning sensation in foot continues.  I may be able to prescribe medication for neuropathy.  Continue antihypertensive and statin medication as prescribed.  May continue with reflux medication.

## 2023-09-30 DIAGNOSIS — G4733 Obstructive sleep apnea (adult) (pediatric): Secondary | ICD-10-CM | POA: Diagnosis not present

## 2023-10-01 ENCOUNTER — Ambulatory Visit (INDEPENDENT_AMBULATORY_CARE_PROVIDER_SITE_OTHER): Payer: BC Managed Care – PPO

## 2023-10-01 VITALS — BP 110/62 | HR 84 | Ht 74.0 in | Wt 254.0 lb

## 2023-10-01 DIAGNOSIS — Z23 Encounter for immunization: Secondary | ICD-10-CM

## 2023-10-01 NOTE — Progress Notes (Signed)
 Patient presents to the office for a TDAP vaccine. Patient received TDAP vaccine in right deltoid, patient tolerated well.

## 2023-10-06 ENCOUNTER — Encounter: Payer: Self-pay | Admitting: Endocrinology

## 2023-10-13 ENCOUNTER — Other Ambulatory Visit: Payer: Self-pay

## 2023-10-13 DIAGNOSIS — E119 Type 2 diabetes mellitus without complications: Secondary | ICD-10-CM

## 2023-10-13 MED ORDER — ROSUVASTATIN CALCIUM 5 MG PO TABS: 5.0000 mg | ORAL_TABLET | Freq: Every day | ORAL | 3 refills | Status: DC

## 2023-10-28 DIAGNOSIS — G4733 Obstructive sleep apnea (adult) (pediatric): Secondary | ICD-10-CM | POA: Diagnosis not present

## 2023-11-03 ENCOUNTER — Other Ambulatory Visit: Payer: Self-pay

## 2023-11-03 ENCOUNTER — Encounter: Payer: Self-pay | Admitting: Internal Medicine

## 2023-11-03 MED ORDER — HYDROCORTISONE ACE-PRAMOXINE 1-1 % EX CREA: 1.0000 | TOPICAL_CREAM | Freq: Two times a day (BID) | CUTANEOUS | 11 refills | Status: AC

## 2023-11-18 ENCOUNTER — Other Ambulatory Visit: Payer: Self-pay

## 2023-11-18 DIAGNOSIS — E119 Type 2 diabetes mellitus without complications: Secondary | ICD-10-CM

## 2023-11-18 DIAGNOSIS — E782 Mixed hyperlipidemia: Secondary | ICD-10-CM

## 2023-11-22 ENCOUNTER — Encounter (HOSPITAL_BASED_OUTPATIENT_CLINIC_OR_DEPARTMENT_OTHER): Payer: Self-pay

## 2023-11-22 ENCOUNTER — Ambulatory Visit (HOSPITAL_BASED_OUTPATIENT_CLINIC_OR_DEPARTMENT_OTHER)
Admission: RE | Admit: 2023-11-22 | Discharge: 2023-11-22 | Disposition: A | Discharge status: Home / Self Care | Source: Ambulatory Visit | Attending: Family Medicine | Admitting: Family Medicine

## 2023-11-22 VITALS — BP 139/81 | HR 71 | Temp 98.7°F | Resp 20

## 2023-11-22 DIAGNOSIS — R051 Acute cough: Secondary | ICD-10-CM | POA: Diagnosis not present

## 2023-11-22 MED ORDER — AZITHROMYCIN 250 MG PO TABS: 250.0000 mg | ORAL_TABLET | Freq: Every day | ORAL | 0 refills | Status: DC

## 2023-11-22 NOTE — ED Triage Notes (Signed)
 Sick since Tuesday. Started as runny nose. States now a cough with ability to bring up phlegm. No fever. No ear pain. Mild pressure under eyes

## 2023-11-22 NOTE — Discharge Instructions (Signed)
 Take the antibiotics as prescribed.  Recommend plain Mucinex over-the-counter for expectorant.  Make sure you are drinking plenty of fluids.  Follow-up as needed

## 2023-11-22 NOTE — ED Provider Notes (Signed)
 [facility]    CSN: [id number] Arrival date & time: 11/22/23  5784      History   Chief Complaint Chief Complaint  Patient presents with   Cough    Coughing up phlegm, runny nose, sinus discomfort since Tuesday. Feels like a possible sinus infection. - Entered by patient   sinus congestion    HPI Lance Beard is a 63 y.o. male.   Patient is a 63 year old male who presents today nasal congestion, runny nose, cough.  Symptoms have been present since Tuesday and worsening.  He is coughing up yellow phlegm but unable to cough up as much as he would like.  He is taking Coricidin HBP. Denies any noted fevers, chills.  Wife had similar symptoms previously   Cough   Past Medical History:  Diagnosis Date   Chronic kidney disease    kidney stones x2   Diabetes mellitus without complication (HCC)    on meds   GERD (gastroesophageal reflux disease)    on meds   Hx of cardiovascular stress test    a. ETT-MV 3/14:  No scar or ischemia, EF 62%   Hyperlipidemia    on meds   Hypertension    on meds   PONV (postoperative nausea and vomiting)    with one surgery   Sleep apnea    uses CPAP    Patient Active Problem List   Diagnosis Date Noted   Type 2 diabetes mellitus with other circulatory complication, without long-term current use of insulin (HCC) 09/19/2023   History of kidney stones 05/01/2017   Fuchs' corneal dystrophy 08/11/2015   Obstructive sleep apnea 03/30/2015   Obesity, unspecified 02/17/2014   History of sleep apnea 02/17/2014   Impaired glucose tolerance 02/17/2014   Essential hypertension, benign 07/23/2013   Hypertriglyceridemia 05/23/2011    Past Surgical History:  Procedure Laterality Date   APPENDECTOMY     COLONOSCOPY  2019   JP-MAC-suprep(exc)-4 polyps-TA and sessile   NASAL SEPTUM SURGERY     VARICOCELE EXCISION     WISDOM TOOTH EXTRACTION         Home Medications    Prior to Admission medications   Medication Sig Start Date  End Date Taking? Authorizing Provider  azithromycin (ZITHROMAX) 250 MG tablet Take 1 tablet (250 mg total) by mouth daily. Take first 2 tablets together, then 1 every day until finished. 11/22/23  Yes Dahlia Byes A, FNP  ALPRAZolam (XANAX) 0.25 MG tablet TAKE 1 TABLET BY MOUTH TWICE A DAY AS NEEDED FOR ANXIETY 07/14/23   Margaree Mackintosh, MD  aspirin 81 MG tablet Take 81 mg by mouth daily.    [provider]  B Complex-C (B-COMPLEX WITH VITAMIN C) tablet Take 1 tablet by mouth daily.    [provider]  Blood Glucose Monitoring Suppl (ONE TOUCH ULTRA 2) w/Device KIT SMARTSIG:1 Via Meter Every Night 08/25/20   [provider]  clobetasol cream (TEMOVATE) 0.05 % APPLY 1 APPLICATION. TOPICALLY 2 (TWO) TIMES DAILY. Patient taking differently: Apply 1 application  topically as needed. 01/22/22   Janalyn Harder, MD  fenofibrate (TRICOR) 145 MG tablet Take 1 tablet (145 mg total) by mouth daily. 08/29/23   Thapa, [place], MD  Lancets [facility] DELICA PLUS [place]) MISC CHECK GLUCOSE TWICE A DAY 04/28/23   Worthy Rancher B, FNP  losartan (COZAAR) 25 MG tablet TAKE 1 TABLET (25 MG TOTAL) BY MOUTH DAILY. 09/08/23   Margaree Mackintosh, MD  metoprolol tartrate (LOPRESSOR) 25 MG tablet  TAKE 1 TABLET BY MOUTH EVERY DAY IN THE MORNING 09/08/23   Margaree Mackintosh, MD  Omega-3 Fatty Acids (FISH OIL) 1000 MG CAPS Take 1 capsule by mouth daily at 6 (six) AM.    [provider]  [facility] ULTRA test strip CHECK GLUCOSE TWICE A DAY 04/28/23   Worthy Rancher B, FNP  pantoprazole (PROTONIX) 40 MG tablet TAKE 1 TABLET BY MOUTH EVERY DAY 04/08/23   Margaree Mackintosh, MD  pramoxine-hydrocortisone (PROCTOCREAM-HC) 1-1 % rectal cream Place 1 Application rectally 2 (two) times daily. 11/03/23   Margaree Mackintosh, MD  rosuvastatin (CRESTOR) 5 MG tablet Take 1 tablet (5 mg total) by mouth daily. 10/13/23   Thapa, [place], MD  tirzepatide [facility]) 7.5 MG/0.5ML Pen INJECT 7.5 MG SUBCUTANEOUSLY WEEKLY 09/11/23   Thapa, [place],  MD    Family History Family History  Problem Relation Age of Onset   Diabetes Father    Hypertension Father    CAD Father        Approximate age 41    Lung cancer Father    Kidney disease Father    Colon cancer Neg Hx    Esophageal cancer Neg Hx    Stomach cancer Neg Hx    Rectal cancer Neg Hx    Colon polyps Neg Hx     Social History Social History   Tobacco Use   Smoking status: Never   Smokeless tobacco: Never  Vaping Use   Vaping status: Never Used  Substance Use Topics   Alcohol use: No   Drug use: No     Allergies   Patient has no known allergies.   Review of Systems Review of Systems  Respiratory:  Positive for cough.      Physical Exam Triage Vital Signs ED Triage Vitals  Encounter Vitals Group     BP 11/22/23 0946 139/81     Systolic BP Percentile --      Diastolic BP Percentile --      Pulse Rate 11/22/23 0946 71     Resp 11/22/23 0946 20     Temp 11/22/23 0946 98.7 F (37.1 C)     Temp Source 11/22/23 0946 Oral     SpO2 11/22/23 0946 96 %     Weight --      Height --      Head Circumference --      Peak Flow --      Pain Score 11/22/23 0948 2     Pain Loc --      Pain Education --      Exclude from Growth Chart --    No data found.  Updated Vital Signs BP 139/81 (BP Location: Right Arm)   Pulse 71   Temp 98.7 F (37.1 C) (Oral)   Resp 20   SpO2 96%   Visual Acuity Right Eye Distance:   Left Eye Distance:   Bilateral Distance:    Right Eye Near:   Left Eye Near:    Bilateral Near:     Physical Exam Constitutional:      General: He is not in acute distress.    Appearance: Normal appearance. He is normal weight. He is not ill-appearing or toxic-appearing.  HENT:     Right Ear: Tympanic membrane and ear canal normal.     Left Ear: Tympanic membrane and ear canal normal.     Nose: Congestion present.     Mouth/Throat:     Pharynx: Oropharynx is clear.  Eyes:  Conjunctiva/sclera: Conjunctivae normal.   Cardiovascular:     Rate and Rhythm: Normal rate and regular rhythm.     Heart sounds: Normal heart sounds.  Pulmonary:     Effort: Pulmonary effort is normal.     Breath sounds: Examination of the left-middle field reveals rhonchi. Examination of the left-lower field reveals rhonchi. Wheezing and rhonchi present.     Comments: Lungs very congested. Not cleared with coughing  Neurological:     Mental Status: He is alert.      UC Treatments / Results  Labs (all labs ordered are listed, but only abnormal results are displayed) Labs Reviewed - No data to display  EKG   Radiology No results found.  Procedures Procedures (including critical care time)  Medications Ordered in UC Medications - No data to display  Initial Impression / Assessment and Plan / UC Course  I have reviewed the triage vital signs and the nursing notes.  Pertinent labs & imaging results that were available during my care of the patient were reviewed by me and considered in my medical decision making (see chart for details).     Acute cough-concern for underlying infection and inability to cough up mucus.  Will go ahead and treat with antibiotics.  Recommend plain Mucinex over-the-counter for expectorant.  Recommend stay hydrated and follow-up as needed Final Clinical Impressions(s) / UC Diagnoses   Final diagnoses:  Acute cough     Discharge Instructions      Take the antibiotics as prescribed.  Recommend plain Mucinex over-the-counter for expectorant.  Make sure you are drinking plenty of fluids.  Follow-up as needed    ED Prescriptions     Medication Sig Dispense Auth. Provider   azithromycin (ZITHROMAX) 250 MG tablet Take 1 tablet (250 mg total) by mouth daily. Take first 2 tablets together, then 1 every day until finished. 6 tablet Janace Aris, FNP      PDMP not reviewed this encounter.   Janace Aris, FNP 11/22/23 1034

## 2023-11-25 ENCOUNTER — Other Ambulatory Visit: Payer: BC Managed Care – PPO

## 2023-11-25 DIAGNOSIS — E782 Mixed hyperlipidemia: Secondary | ICD-10-CM | POA: Diagnosis not present

## 2023-11-25 DIAGNOSIS — E119 Type 2 diabetes mellitus without complications: Secondary | ICD-10-CM | POA: Diagnosis not present

## 2023-11-26 ENCOUNTER — Encounter: Payer: Self-pay | Admitting: Endocrinology

## 2023-11-26 ENCOUNTER — Other Ambulatory Visit: Payer: Self-pay | Admitting: Endocrinology

## 2023-11-26 DIAGNOSIS — E782 Mixed hyperlipidemia: Secondary | ICD-10-CM

## 2023-11-26 LAB — BASIC METABOLIC PANEL WITH GFR
BUN: 19 mg/dL (ref 7–25)
CO2: 31 mmol/L (ref 20–32)
Calcium: 9.7 mg/dL (ref 8.6–10.3)
Chloride: 102 mmol/L (ref 98–110)
Creat: 1.15 mg/dL (ref 0.70–1.35)
Glucose, Bld: 107 mg/dL — ABNORMAL HIGH (ref 65–99)
Potassium: 4.4 mmol/L (ref 3.5–5.3)
Sodium: 140 mmol/L (ref 135–146)
eGFR: 72 mL/min/{1.73_m2} (ref >=60)

## 2023-11-26 LAB — LIPID PANEL
Cholesterol: 92 mg/dL (ref <200)
HDL: 23 mg/dL — ABNORMAL LOW (ref >=40)
LDL Cholesterol (Calc): 50 mg/dL
Non-HDL Cholesterol (Calc): 69 mg/dL (ref <130)
Total CHOL/HDL Ratio: 4 (calc) (ref <5.0)
Triglycerides: 106 mg/dL (ref <150)

## 2023-11-26 LAB — HEMOGLOBIN A1C
Hgb A1c MFr Bld: 6.2 %{Hb} — ABNORMAL HIGH (ref <5.7)
Mean Plasma Glucose: 131 mg/dL
eAG (mmol/L): 7.3 mmol/L

## 2023-11-29 DIAGNOSIS — G4733 Obstructive sleep apnea (adult) (pediatric): Secondary | ICD-10-CM | POA: Diagnosis not present

## 2023-12-02 ENCOUNTER — Encounter: Payer: Self-pay | Admitting: Endocrinology

## 2023-12-02 ENCOUNTER — Other Ambulatory Visit: Payer: Self-pay | Admitting: Internal Medicine

## 2023-12-02 ENCOUNTER — Ambulatory Visit: Payer: BC Managed Care – PPO | Admitting: Endocrinology

## 2023-12-02 DIAGNOSIS — Z7985 Long-term (current) use of injectable non-insulin antidiabetic drugs: Secondary | ICD-10-CM | POA: Diagnosis not present

## 2023-12-02 DIAGNOSIS — E119 Type 2 diabetes mellitus without complications: Secondary | ICD-10-CM

## 2023-12-02 MED ORDER — MOUNJARO 7.5 MG/0.5ML ~~LOC~~ SOAJ: SUBCUTANEOUS | 3 refills | Status: DC

## 2023-12-02 NOTE — Progress Notes (Signed)
 Outpatient Endocrinology Note [place] Shabre Kreher, MD  12/02/23  Patient's Name: Lance Beard    DOB: [birth year]    MRN: [id number]                                                    REASON OF VISIT: Follow-up of type 2 diabetes mellitus /hyperlipidemia  PCP: Margaree Mackintosh, MD  HISTORY OF PRESENT ILLNESS:   JARAD BARTH is a 63 y.o. old male with past medical history listed below, is here for follow up of type 2 diabetes mellitus.   Pertinent Diabetes History: Patient was diagnosed with type 2 diabetes mellitus in 2020.  He initially had prediabetes in 2019.  Chronic Diabetes Complications : Retinopathy: no. Last ophthalmology exam was done on annually. Nephropathy: no, on losartan. Peripheral neuropathy: no Coronary artery disease: no Stroke: no  Relevant comorbidities and cardiovascular risk factors: Obesity: yes Body mass index is 32.41 kg/m.  Hypertension: yes Hyperlipidemia. Yes, on a statin.  He was found to have hypertriglyceridemia around 2010.  He was initially tried on Tricor, was subsequently changed to atorvastatin, then to fenofibrate.  He has been taking rosuvastatin, fenofibrate and fish oil.  Current / Home Diabetic regimen includes: Mounjaro 7.5 mg weekly.  Prior diabetic medications: Metformin stopped due to diarrhea.  Was on Januvia in the past.  Was on glipizide extended release in the past.  Trulicity.  Glycemic data:   He forgot to bring glucometer in the clinic today, reports blood sugar in average 120- 125 range.   Hypoglycemia: Patient has no hypoglycemic episodes. Patient has hypoglycemia awareness.  Factors modifying glucose control: 1.  Diabetic diet assessment: 2-3 meals a day.  2.  Staying active or exercising: walk 45 min a day, 5-6 days.   3.  Medication compliance: compliant all of the time.  Interval history Hemoglobin A1c staying stable 6.2%.  He has been taking Mounjaro reports fair appetite and somewhat suppressed for 2 days after the  injection.  Denies numbness and ting of the feet.  No vision problem.  No other complaints today.  Body weight has remained stable.  Laboratory results reviewed stable renal function, electrolytes, acceptable cholesterol levels with LDL of 50.  REVIEW OF SYSTEMS As per history of present illness.   PAST MEDICAL HISTORY: Past Medical History:  Diagnosis Date   Chronic kidney disease    kidney stones x2   Diabetes mellitus without complication (HCC)    on meds   GERD (gastroesophageal reflux disease)    on meds   Hx of cardiovascular stress test    a. ETT-MV 3/14:  No scar or ischemia, EF 62%   Hyperlipidemia    on meds   Hypertension    on meds   PONV (postoperative nausea and vomiting)    with one surgery   Sleep apnea    uses CPAP    PAST SURGICAL HISTORY: Past Surgical History:  Procedure Laterality Date   APPENDECTOMY     COLONOSCOPY  2019   JP-MAC-suprep(exc)-4 polyps-TA and sessile   NASAL SEPTUM SURGERY     VARICOCELE EXCISION     WISDOM TOOTH EXTRACTION      ALLERGIES: No Known Allergies  FAMILY HISTORY:  Family History  Problem Relation Age of Onset   Diabetes Father    Hypertension Father    CAD  Father        Approximate age 38    Lung cancer Father    Kidney disease Father    Colon cancer Neg Hx    Esophageal cancer Neg Hx    Stomach cancer Neg Hx    Rectal cancer Neg Hx    Colon polyps Neg Hx     SOCIAL HISTORY: Social History   Socioeconomic History   Marital status: Married    Spouse name: Not on file   Number of children: 1   Years of education: Not on file   Highest education level: Not on file  Occupational History    Employer: [employer]    Comment: Accountant  Tobacco Use   Smoking status: Never   Smokeless tobacco: Never  Vaping Use   Vaping status: Never Used  Substance and Sexual Activity   Alcohol use: No   Drug use: No   Sexual activity: Not Currently  Other Topics Concern   Not on file  Social History  Narrative   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 Nonsmoker.   Married. Does not consume alcohol. One daughter who lives in the [place] area.         09/17/22 - He has cut back on work and is not working half time.    Social Drivers of Corporate investment banker Strain: Not on file  Food Insecurity: No Food Insecurity (09/19/2023)   Hunger Vital Sign    Worried About Running Out of Food in the Last Year: Never true    Ran Out of Food in the Last Year: Never true  Transportation Needs: No Transportation Needs (09/19/2023)   PRAPARE - Administrator, Civil Service (Medical): No    Lack of Transportation (Non-Medical): No  Physical Activity: Not on file  Stress: Not on file  Social Connections: Not on file    MEDICATIONS:  Current Outpatient Medications  Medication Sig Dispense Refill   ALPRAZolam (XANAX) 0.25 MG tablet TAKE 1 TABLET BY MOUTH TWICE A DAY AS NEEDED FOR ANXIETY 60 tablet 0   aspirin 81 MG tablet Take 81 mg by mouth daily.     azithromycin (ZITHROMAX) 250 MG tablet Take 1 tablet (250 mg total) by mouth daily. Take first 2 tablets together, then 1 every day until finished. 6 tablet 0   B Complex-C (B-COMPLEX WITH VITAMIN C) tablet Take 1 tablet by mouth daily.     Blood Glucose Monitoring Suppl (ONE TOUCH ULTRA 2) w/Device KIT SMARTSIG:1 Via Meter Every Night     clobetasol cream (TEMOVATE) 0.05 % APPLY 1 APPLICATION. TOPICALLY 2 (TWO) TIMES DAILY. (Patient taking differently: Apply 1 application  topically as needed.) 60 g 6   fenofibrate (TRICOR) 145 MG tablet TAKE 1 TABLET BY MOUTH EVERY DAY 90 tablet 3   Lancets (ONETOUCH DELICA PLUS LANCET33G) MISC CHECK GLUCOSE TWICE A DAY 100 each PRN   losartan (COZAAR) 25 MG tablet TAKE 1 TABLET (25 MG TOTAL) BY MOUTH DAILY. 90 tablet 0   metoprolol tartrate (LOPRESSOR) 25 MG tablet TAKE 1 TABLET BY MOUTH EVERY DAY IN THE MORNING 90 tablet 0    Omega-3 Fatty Acids (FISH OIL) 1000 MG CAPS Take 1 capsule by mouth daily at 6 (six) AM.     ONETOUCH ULTRA test strip CHECK GLUCOSE TWICE A DAY 100 strip PRN   pantoprazole (PROTONIX) 40 MG tablet TAKE 1 TABLET BY MOUTH EVERY DAY  30 tablet 11   pramoxine-hydrocortisone (PROCTOCREAM-HC) 1-1 % rectal cream Place 1 Application rectally 2 (two) times daily. 30 g 11   rosuvastatin (CRESTOR) 5 MG tablet Take 1 tablet (5 mg total) by mouth daily. 90 tablet 3   tirzepatide (MOUNJARO) 7.5 MG/0.5ML Pen INJECT 7.5 MG SUBCUTANEOUSLY WEEKLY 6 mL 3   No current facility-administered medications for this visit.    PHYSICAL EXAM: Vitals:   12/02/23 0805  BP: 138/70  Pulse: 86  Resp: 16  SpO2: 97%  Weight: 252 lb 6.4 oz (114.5 kg)  Height: 6\' 2"  (1.88 m)   Body mass index is 32.41 kg/m.  Wt Readings from Last 3 Encounters:  12/02/23 252 lb 6.4 oz (114.5 kg)  10/01/23 254 lb (115.2 kg)  09/19/23 254 lb (115.2 kg)    General: Well developed, well nourished male in no apparent distress.  HEENT: AT/[place], no external lesions.  Eyes: Conjunctiva clear and no icterus. Neck: Neck supple  Lungs: Respirations not labored Neurologic: Alert, oriented, normal speech Extremities / Skin: Dry.  Psychiatric: Does not appear depressed or anxious  Diabetic Foot Exam - Simple   No data filed    LABS Reviewed Lab Results  Component Value Date   HGBA1C 6.2 (H) 11/25/2023   HGBA1C 6.2 (H) 09/15/2023   HGBA1C 5.6 06/03/2023   No results found for: "FRUCTOSAMINE" Lab Results  Component Value Date   CHOL 92 11/25/2023   HDL 23 (L) 11/25/2023   LDLCALC 50 11/25/2023   LDLDIRECT 90.0 06/21/2022   TRIG 106 11/25/2023   CHOLHDL 4.0 11/25/2023   Lab Results  Component Value Date   MICRALBCREAT 3 09/15/2023   MICRALBCREAT 0.7 06/03/2023   Lab Results  Component Value Date   CREATININE 1.15 11/25/2023   Lab Results  Component Value Date   GFR 64.77 02/03/2023    ASSESSMENT / PLAN  1. Type 2  diabetes mellitus without complication, without long-term current use of insulin (HCC)      Diabetes Mellitus type 2, complicated by no known complications. - Diabetic status / severity: Controlled.  Lab Results  Component Value Date   HGBA1C 6.2 (H) 11/25/2023    - Hemoglobin A1c goal : <7%  - Medications: No change.  Discussed about potentially increasing Mounjaro for weight loss benefit.  Patient is having significant appetite suppression in the beginning of the injection and would like to continue on the same.  He has been walking almost daily to help with weight loss.  I) Mounjaro 7.5 mg subcutaneous weekly.  - Home glucose testing: Few times a week in the morning fasting. - Discussed/ Gave Hypoglycemia treatment plan.  # Consult : not required at this time.   # Annual urine for microalbuminuria/ creatinine ratio, no microalbuminuria currently, continue ACE/ARB /losartan.  Last  Lab Results  Component Value Date   MICRALBCREAT 3 09/15/2023    # Foot check nightly.  # Annual dilated diabetic eye exams.   - Diet: Make healthy diabetic food choices - Life style / activity / exercise: Discussed.  2. Blood pressure  -  BP Readings from Last 1 Encounters:  12/02/23 138/70    - Control is in target.  - No change in current plans.  3. Lipid status / Hyperlipidemia - Last  Lab Results  Component Value Date   LDLCALC 50 11/25/2023   - Continue rosuvastatin 5 mg daily.  Fenofibrate 145 mg daily.    Diagnoses and all orders for this visit:  Type 2 diabetes mellitus  without complication, without long-term current use of insulin (HCC) -     tirzepatide (MOUNJARO) 7.5 MG/0.5ML Pen; INJECT 7.5 MG SUBCUTANEOUSLY WEEKLY     DISPOSITION Follow up in clinic in 6 months suggested.   All questions answered and patient verbalized understanding of the plan.  [place] Marce Schartz, MD [facility] Endocrinology [facility] [address] [place], [address] [place], [place]  [address] Phone # [phone]  At least part of this note was generated using voice recognition software. Inadvertent word errors may have occurred, which were not recognized during the proofreading process.

## 2023-12-05 ENCOUNTER — Other Ambulatory Visit
Admission: RE | Admit: 2023-12-05 | Discharge: 2023-12-05 | Disposition: A | Discharge status: Home / Self Care | Payer: Self-pay | Source: Ambulatory Visit | Attending: Medical Genetics | Admitting: Medical Genetics

## 2023-12-05 DIAGNOSIS — Z006 Encounter for examination for normal comparison and control in clinical research program: Secondary | ICD-10-CM | POA: Insufficient documentation

## 2023-12-09 ENCOUNTER — Other Ambulatory Visit: Payer: Self-pay | Admitting: Internal Medicine

## 2023-12-09 ENCOUNTER — Other Ambulatory Visit: Payer: Self-pay

## 2023-12-09 DIAGNOSIS — I10 Essential (primary) hypertension: Secondary | ICD-10-CM

## 2023-12-09 MED ORDER — METOPROLOL TARTRATE 25 MG PO TABS: ORAL_TABLET | ORAL | 0 refills | Status: DC

## 2023-12-09 MED ORDER — LOSARTAN POTASSIUM 25 MG PO TABS
25.0000 mg | ORAL_TABLET | Freq: Every day | ORAL | 0 refills | Status: DC
Start: 2023-12-09 — End: 2024-03-31

## 2023-12-09 NOTE — Progress Notes (Unsigned)
 Cardiology Office Note   Date:  12/09/2023   ID:  Lance Beard, DOB 1960/11/02, MRN [id number]  PCP:  Margaree Mackintosh, MD     FU Coronary artery calcification       History of Present Illness: Lance Beard is a 63 y.o. male   with risk factors for CAD. Previously followed by Dr Eldridge Dace but transitioning to me.   2021 CTA coronaries showed: "Coronary calcium score of 78.2. This was 69th percentile for age and sex matched control.   2. Normal coronary origin with right dominance.   3. Mild calcified plaque in the proximal LAD causing minimal stenosis (0-24%)   4. CAD-RADS 1. Minimal non-obstructive CAD (0-24%). Consider non-atherosclerotic causes of chest pain. Consider preventive therapy and risk factor modification." Monitor 2023 showed sinus with rare PAC, PVC and 9 beat run of SVT.  Since last seen,   Past Medical History:  Diagnosis Date   Chronic kidney disease    kidney stones x2   Diabetes mellitus without complication (HCC)    on meds   GERD (gastroesophageal reflux disease)    on meds   Hx of cardiovascular stress test    a. ETT-MV 3/14:  No scar or ischemia, EF 62%   Hyperlipidemia    on meds   Hypertension    on meds   PONV (postoperative nausea and vomiting)    with one surgery   Sleep apnea    uses CPAP    Past Surgical History:  Procedure Laterality Date   APPENDECTOMY     COLONOSCOPY  2019   JP-MAC-suprep(exc)-4 polyps-TA and sessile   NASAL SEPTUM SURGERY     VARICOCELE EXCISION     WISDOM TOOTH EXTRACTION       Current Outpatient Medications  Medication Sig Dispense Refill   ALPRAZolam (XANAX) 0.25 MG tablet TAKE 1 TABLET BY MOUTH TWICE A DAY AS NEEDED FOR ANXIETY 60 tablet 0   aspirin 81 MG tablet Take 81 mg by mouth daily.     azithromycin (ZITHROMAX) 250 MG tablet Take 1 tablet (250 mg total) by mouth daily. Take first 2 tablets together, then 1 every day until finished. 6 tablet 0   B Complex-C (B-COMPLEX WITH VITAMIN C)  tablet Take 1 tablet by mouth daily.     Blood Glucose Monitoring Suppl (ONE TOUCH ULTRA 2) w/Device KIT SMARTSIG:1 Via Meter Every Night     clobetasol cream (TEMOVATE) 0.05 % APPLY 1 APPLICATION. TOPICALLY 2 (TWO) TIMES DAILY. (Patient taking differently: Apply 1 application  topically as needed.) 60 g 6   fenofibrate (TRICOR) 145 MG tablet TAKE 1 TABLET BY MOUTH EVERY DAY 90 tablet 3   Lancets (ONETOUCH DELICA PLUS LANCET33G) MISC CHECK GLUCOSE TWICE A DAY 100 each PRN   losartan (COZAAR) 25 MG tablet TAKE 1 TABLET (25 MG TOTAL) BY MOUTH DAILY. 90 tablet 0   metoprolol tartrate (LOPRESSOR) 25 MG tablet TAKE 1 TABLET BY MOUTH EVERY DAY IN THE MORNING 90 tablet 0   Omega-3 Fatty Acids (FISH OIL) 1000 MG CAPS Take 1 capsule by mouth daily at 6 (six) AM.     ONETOUCH ULTRA test strip CHECK GLUCOSE TWICE A DAY 100 strip PRN   pantoprazole (PROTONIX) 40 MG tablet TAKE 1 TABLET BY MOUTH EVERY DAY 30 tablet 11   pramoxine-hydrocortisone (PROCTOCREAM-HC) 1-1 % rectal cream Place 1 Application rectally 2 (two) times daily. 30 g 11   rosuvastatin (CRESTOR) 5 MG tablet Take 1 tablet (5 mg  total) by mouth daily. 90 tablet 3   tirzepatide (MOUNJARO) 7.5 MG/0.5ML Pen INJECT 7.5 MG SUBCUTANEOUSLY WEEKLY 6 mL 3   No current facility-administered medications for this visit.    Allergies:   Patient has no known allergies.    Social History:  The patient  reports that he has never smoked. He has never used smokeless tobacco. He reports that he does not drink alcohol and does not use drugs.   Family History:  The patient's family history includes CAD in his father; Diabetes in his father; Hypertension in his father; Kidney disease in his father; Lung cancer in his father.    ROS:  Please see the history of present illness.   Otherwise, review of systems are positive for stress from work.   All other systems are reviewed and negative.    PHYSICAL EXAM: VS:  There were no vitals taken for this visit. , BMI  There is no height or weight on file to calculate BMI. GEN: Well nourished, well developed, in no acute distress HEENT: normal Neck: no JVD, carotid bruits, or masses Cardiac: RRR; no murmurs, rubs, or gallops,no edema  Respiratory:  clear to auscultation bilaterally, normal work of breathing GI: soft, nontender, nondistended, + BS MS: no deformity or atrophy Skin: warm and dry, no rash Neuro:  Strength and sensation are intact    Recent Labs: 09/15/2023: ALT 34; Hemoglobin 17.0; Platelets 234 11/25/2023: BUN 19; Creat 1.15; Potassium 4.4; Sodium 140   Lipid Panel    Component Value Date/Time   CHOL 92 11/25/2023 0803   TRIG 106 11/25/2023 0803   HDL 23 (L) 11/25/2023 0803   CHOLHDL 4.0 11/25/2023 0803   VLDL 30.6 02/03/2023 0818   LDLCALC 50 11/25/2023 0803   LDLDIRECT 90.0 06/21/2022 0742      ASSESSMENT AND PLAN:  CAD: continue medical therapy with ASA and statin. Palpitations:  Hyperlipidemia: continue crestor and fenofibrate. Hypertension: BP controlled; continue present meds and follow.   Signed, Alexandria Angel, MD  12/09/2023 12:53 AM    [facility] [address] [place], [place], [place]  [address] Phone: [phone]; Fax: [phone]

## 2023-12-16 ENCOUNTER — Encounter: Payer: Self-pay | Admitting: Cardiology

## 2023-12-16 ENCOUNTER — Ambulatory Visit: Payer: BC Managed Care – PPO | Attending: Cardiology | Admitting: Cardiology

## 2023-12-16 VITALS — BP 114/60 | HR 76 | Ht 74.0 in | Wt 252.0 lb

## 2023-12-16 DIAGNOSIS — R002 Palpitations: Secondary | ICD-10-CM

## 2023-12-16 DIAGNOSIS — I2510 Atherosclerotic heart disease of native coronary artery without angina pectoris: Secondary | ICD-10-CM | POA: Diagnosis not present

## 2023-12-16 DIAGNOSIS — E782 Mixed hyperlipidemia: Secondary | ICD-10-CM

## 2023-12-16 DIAGNOSIS — I10 Essential (primary) hypertension: Secondary | ICD-10-CM

## 2023-12-16 LAB — GENECONNECT MOLECULAR SCREEN: Genetic Analysis Overall Interpretation: NEGATIVE

## 2023-12-16 MED ORDER — METOPROLOL SUCCINATE ER 25 MG PO TB24: 25.0000 mg | ORAL_TABLET | Freq: Every day | ORAL | 3 refills | Status: AC

## 2023-12-16 NOTE — Patient Instructions (Addendum)
 STOP CURRENT METOPROLOL   START METOPROLOL  SUCC ER 25 MG ONCE DAILY AT BEDTIME  Follow-Up: At [facility], you and your health needs are our priority.  As part of our continuing mission to provide you with exceptional heart care, our providers are all part of one team.  This team includes your primary Cardiologist (physician) and Advanced Practice Providers or APPs (Physician Assistants and Nurse Practitioners) who all work together to provide you with the care you need, when you need it.  Your next appointment:   12 month(s)  Provider:   Alexandria Angel MD         1st Floor: - Lobby - Registration  - Pharmacy  - Lab - Cafe  2nd Floor: - PV Lab - Diagnostic Testing (echo, CT, nuclear med)  3rd Floor: - Vacant  4th Floor: - TCTS (cardiothoracic surgery) - AFib Clinic - Structural Heart Clinic - Vascular Surgery  - Vascular Ultrasound  5th Floor: - HeartCare Cardiology (general and EP) - Clinical Pharmacy for coumadin, hypertension, lipid, weight-loss medications, and med management appointments    Valet parking services will be available as well.

## 2024-03-02 DIAGNOSIS — G4733 Obstructive sleep apnea (adult) (pediatric): Secondary | ICD-10-CM | POA: Diagnosis not present

## 2024-03-24 ENCOUNTER — Other Ambulatory Visit: Payer: Self-pay | Admitting: Internal Medicine

## 2024-03-31 ENCOUNTER — Other Ambulatory Visit: Payer: Self-pay | Admitting: Internal Medicine

## 2024-03-31 DIAGNOSIS — I10 Essential (primary) hypertension: Secondary | ICD-10-CM

## 2024-04-02 DIAGNOSIS — G4733 Obstructive sleep apnea (adult) (pediatric): Secondary | ICD-10-CM | POA: Diagnosis not present

## 2024-04-05 ENCOUNTER — Other Ambulatory Visit: Payer: Self-pay | Admitting: Internal Medicine

## 2024-05-03 DIAGNOSIS — G4733 Obstructive sleep apnea (adult) (pediatric): Secondary | ICD-10-CM | POA: Diagnosis not present

## 2024-05-11 ENCOUNTER — Encounter: Payer: Self-pay | Admitting: Endocrinology

## 2024-05-11 ENCOUNTER — Ambulatory Visit: Admitting: Internal Medicine

## 2024-05-11 ENCOUNTER — Encounter: Payer: Self-pay | Admitting: Internal Medicine

## 2024-05-11 VITALS — BP 120/70 | HR 88 | Ht 74.0 in | Wt 253.0 lb

## 2024-05-11 DIAGNOSIS — I10 Essential (primary) hypertension: Secondary | ICD-10-CM | POA: Diagnosis not present

## 2024-05-11 DIAGNOSIS — Z23 Encounter for immunization: Secondary | ICD-10-CM

## 2024-05-11 DIAGNOSIS — E119 Type 2 diabetes mellitus without complications: Secondary | ICD-10-CM | POA: Diagnosis not present

## 2024-05-11 DIAGNOSIS — M7741 Metatarsalgia, right foot: Secondary | ICD-10-CM

## 2024-05-11 DIAGNOSIS — Z7985 Long-term (current) use of injectable non-insulin antidiabetic drugs: Secondary | ICD-10-CM

## 2024-05-11 NOTE — Progress Notes (Signed)
 Patient Care Team: Perri Ronal PARAS, MD as PCP - General (Internal Medicine) Pietro Redell RAMAN, MD as PCP - Cardiology (Cardiology) Livingston Rigg, MD as Consulting Physician (Dermatology)  Visit Date: 05/11/24  Subjective:    Patient ID: Lance Beard , Male   DOB: [birth year], 62 y.o.    MRN: [id number]   63 y.o. Male presents today for office visit for right foot pain . Patient has a past medical history of Hypertension, Diabetes Mellitus, type II . Hypertriglyceridemia .  History of Hypertension treated with Losartan  25 mg daily and 25 mg Metoprolol  Tartrate daily Blood pressure today is normal at 120/70.He  says his blood pressures been good recently.    History of Diabetes Mellitus, Type II Treated with Mounjaro  7.5 mg  injected weekly.  11/25/2023 Hgb A1c 6.2%  He says his right foot is sore and that is painful when he walks. A physical exam revealed Mild to moderate callous formation distal 5th metatarsal plantar aspect. Suggested corn pads and if it doesn't improve will seek referral to [facility].   Vaccine counseling: UTD on Influenza vaccine      Past Medical History:  Diagnosis Date   Chronic kidney disease    kidney stones x2   Diabetes mellitus without complication (HCC)    on meds   GERD (gastroesophageal reflux disease)    on meds   Hx of cardiovascular stress test    a. ETT-MV 3/14:  No scar or ischemia, EF 62%   Hyperlipidemia    on meds   Hypertension    on meds   PONV (postoperative nausea and vomiting)    with one surgery   Sleep apnea    uses CPAP     Family History  Problem Relation Age of Onset   Diabetes Father    Hypertension Father    CAD Father        Approximate age 68    Lung cancer Father    Kidney disease Father    Colon cancer Neg Hx    Esophageal cancer Neg Hx    Stomach cancer Neg Hx    Rectal cancer Neg Hx    Colon polyps Neg Hx     Social History   Social History Narrative   He is a Probation officer.  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   Married. Does not consume alcohol. One daughter who lives in the [place] area.         09/17/22 - He has cut back on work and is not working half time.       Review of Systems  Constitutional:  Negative for fever and malaise/fatigue.  HENT:  Negative for congestion.   Eyes:  Negative for blurred vision.  Respiratory:  Negative for cough and shortness of breath.   Cardiovascular:  Negative for chest pain, palpitations and leg swelling.  Gastrointestinal:  Negative for vomiting.  Musculoskeletal:  Negative for back pain.       Foot pain  Skin:  Negative for rash.  Neurological:  Negative for loss of consciousness and headaches.        Objective:   Vitals: BP 120/70   Pulse 88   Ht 6' 2 (1.88 m)   Wt 253 lb (114.8 kg)   SpO2 98%   BMI 32.48 kg/m    Physical Exam Constitutional:      General: He is not in acute distress.    Appearance: Normal  appearance. He is not ill-appearing.  HENT:     Head: Normocephalic and atraumatic.  Cardiovascular:     Rate and Rhythm: Normal rate and regular rhythm.     Pulses: Normal pulses.     Heart sounds: Normal heart sounds. No murmur heard.    No friction rub. No gallop.  Pulmonary:     Effort: Pulmonary effort is normal. No respiratory distress.     Breath sounds: Normal breath sounds. No wheezing or rales.  Skin:    General: Skin is warm and dry.  Neurological:     Mental Status: He is alert and oriented to person, place, and time. Mental status is at baseline.  Psychiatric:        Mood and Affect: Mood normal.        Behavior: Behavior normal.        Thought Content: Thought content normal.        Judgment: Judgment normal.       Results:     Labs:       Component Value Date/Time   NA 140 11/25/2023 0803   K 4.4 11/25/2023 0803   CL 102 11/25/2023 0803   CO2 31 11/25/2023 0803   GLUCOSE 107 (H) 11/25/2023 0803   BUN  19 11/25/2023 0803   CREATININE 1.15 11/25/2023 0803   CALCIUM  9.7 11/25/2023 0803   PROT 6.7 09/15/2023 0908   ALBUMIN 4.4 02/03/2023 0818   AST 18 09/15/2023 0908   ALT 34 09/15/2023 0908   ALKPHOS 15 (L) 02/03/2023 0818   BILITOT 0.8 09/15/2023 0908   GFRNONAA 63 02/19/2021 1242   GFRAA 73 02/19/2021 1242     Lab Results  Component Value Date   WBC 6.9 09/15/2023   HGB 17.0 09/15/2023   HCT 50.3 (H) 09/15/2023   MCV 94.4 09/15/2023   PLT 234 09/15/2023    Lab Results  Component Value Date   CHOL 92 11/25/2023   HDL 23 (L) 11/25/2023   LDLCALC 50 11/25/2023   LDLDIRECT 90.0 06/21/2022   TRIG 106 11/25/2023   CHOLHDL 4.0 11/25/2023    Lab Results  Component Value Date   HGBA1C 6.2 (H) 11/25/2023     Lab Results  Component Value Date   TSH 2.35 12/11/2021     Lab Results  Component Value Date   PSA 1.91 09/15/2023   PSA 1.28 09/09/2022   PSA 1.13 08/17/2020      Assessment & Plan:   He says his right foot is sore and that is painful  when he walks. A physical exam revealed Mild to moderate callous formation distal 5th metatarsal plantar aspect.  Suggested corn pads and if it doesn't improve will seek referral to [facility].   Vaccine counseling: UTD on Influenza vaccine     I,Makayla C Reid,acting as a scribe for Ronal JINNY Hailstone, MD.,have documented all relevant documentation on the behalf of Ronal JINNY Hailstone, MD,as directed by  Ronal JINNY Hailstone, MD while in the presence of Ronal JINNY Hailstone, MD.

## 2024-05-12 NOTE — Patient Instructions (Signed)
 May try corn pad to distal 5 th metatarsal. If not improving, consider referral to [facility] for orthotics and perhaps Xray. Avoid  Aleve/Advil due to Diabetes. Could take Tylenol  if needs pain medication. May soak foot in warm Epsom salt water.

## 2024-05-16 ENCOUNTER — Other Ambulatory Visit: Payer: Self-pay | Admitting: Family

## 2024-05-16 DIAGNOSIS — E119 Type 2 diabetes mellitus without complications: Secondary | ICD-10-CM

## 2024-06-02 ENCOUNTER — Ambulatory Visit: Payer: Self-pay | Admitting: Endocrinology

## 2024-06-02 ENCOUNTER — Ambulatory Visit: Admitting: Endocrinology

## 2024-06-02 ENCOUNTER — Encounter: Payer: Self-pay | Admitting: Endocrinology

## 2024-06-02 ENCOUNTER — Other Ambulatory Visit: Payer: Self-pay | Admitting: Endocrinology

## 2024-06-02 VITALS — BP 132/62 | HR 75 | Resp 20 | Ht 74.0 in | Wt 254.8 lb

## 2024-06-02 DIAGNOSIS — E119 Type 2 diabetes mellitus without complications: Secondary | ICD-10-CM | POA: Diagnosis not present

## 2024-06-02 DIAGNOSIS — Z7985 Long-term (current) use of injectable non-insulin antidiabetic drugs: Secondary | ICD-10-CM

## 2024-06-02 LAB — POCT GLYCOSYLATED HEMOGLOBIN (HGB A1C): Hemoglobin A1C: 5.7 % — AB (ref 4.0–5.6)

## 2024-06-02 MED ORDER — TIRZEPATIDE 10 MG/0.5ML ~~LOC~~ SOAJ: 10.0000 mg | SUBCUTANEOUS | 4 refills | Status: DC

## 2024-06-02 NOTE — Progress Notes (Signed)
 Outpatient Endocrinology Note [place] Antionio Negron, MD  06/02/24  Patient's Name: Lance Beard    DOB: 07/17/1961    MRN: [id number]                                                    REASON OF VISIT: Follow-up of type 2 diabetes mellitus / hyperlipidemia  PCP: Perri Ronal PARAS, MD  HISTORY OF PRESENT ILLNESS:   Lance Beard is a 63 y.o. old male with past medical history listed below, is here for follow up of type 2 diabetes mellitus.   Pertinent Diabetes History: Patient was diagnosed with type 2 diabetes mellitus in 2020.  He initially had prediabetes in 2019.  Chronic Diabetes Complications : Retinopathy: no. Last ophthalmology exam was done on annually. Nephropathy: no, on losartan . Peripheral neuropathy: no Coronary artery disease: no Stroke: no  Relevant comorbidities and cardiovascular risk factors: Obesity: yes Body mass index is 32.71 kg/m.  Hypertension: yes Hyperlipidemia. Yes, on a statin.  He was found to have hypertriglyceridemia around 2010.  He was initially tried on Tricor , was subsequently changed to atorvastatin , then to fenofibrate .  He has been taking rosuvastatin , fenofibrate  and fish oil.  Current / Home Diabetic regimen includes: Mounjaro  7.5 mg weekly.  Prior diabetic medications: Metformin  stopped due to diarrhea.  Was on Januvia  in the past.  Was on glipizide  extended release in the past.  Trulicity .  Glycemic data:   One Touch ultra 2 glucometer download from September 24 to June 02, 2024 reviewed average blood sugar 118. He has been checking blood sugar alternating in the morning fasting and at bedtime.  Mostly acceptable blood sugar.  No concerning hypoglycemia or hyperglycemia.   Hypoglycemia: Patient has no hypoglycemic episodes. Patient has hypoglycemia awareness.  Factors modifying glucose control: 1.  Diabetic diet assessment: 2-3 meals a day.  2.  Staying active or exercising: walk 45 min a day, 5-6 days.   3.  Medication compliance:  compliant all of the time.  Interval history Glucometer data as reviewed above.  Hemoglobin A1c 5.7%.  He has been tolerating Mounjaro  well, currently taking 7.5 mg weekly, body weight is relatively stable.  He has somewhat suppressed appetite next day of the injection otherwise no GI issues.  No other complaints today.  REVIEW OF SYSTEMS As per history of present illness.   PAST MEDICAL HISTORY: Past Medical History:  Diagnosis Date   Chronic kidney disease    kidney stones x2   Diabetes mellitus without complication (HCC)    on meds   GERD (gastroesophageal reflux disease)    on meds   Hx of cardiovascular stress test    a. ETT-MV 3/14:  No scar or ischemia, EF 62%   Hyperlipidemia    on meds   Hypertension    on meds   PONV (postoperative nausea and vomiting)    with one surgery   Sleep apnea    uses CPAP    PAST SURGICAL HISTORY: Past Surgical History:  Procedure Laterality Date   APPENDECTOMY     COLONOSCOPY  2019   JP-MAC-suprep(exc)-4 polyps-TA and sessile   NASAL SEPTUM SURGERY     VARICOCELE EXCISION     WISDOM TOOTH EXTRACTION      ALLERGIES: No Known Allergies  FAMILY HISTORY:  Family History  Problem Relation Age of Onset  Diabetes Father    Hypertension Father    CAD Father        Approximate age 13    Lung cancer Father    Kidney disease Father    Colon cancer Neg Hx    Esophageal cancer Neg Hx    Stomach cancer Neg Hx    Rectal cancer Neg Hx    Colon polyps Neg Hx     SOCIAL HISTORY: Social History   Socioeconomic History   Marital status: Married    Spouse name: Not on file   Number of children: 1   Years of education: Not on file   Highest education level: Bachelor's degree (e.g., BA, AB, BS)  Occupational History    Employer: [employer]    Comment: Accountant  Tobacco Use   Smoking status: Never   Smokeless tobacco: Never  Vaping Use   Vaping status: Never Used  Substance and Sexual Activity   Alcohol use: No   Drug  use: No   Sexual activity: Not Currently  Other Topics Concern   Not on file  Social History Narrative   He is a [employer]. 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   Married. Does not consume alcohol. One daughter who lives in the [place] area.         09/17/22 - He has cut back on work and is not working half time.    Social Drivers of Corporate investment banker Strain: Low Risk  (05/11/2024)   Overall Financial Resource Strain (CARDIA)    Difficulty of Paying Living Expenses: Not hard at all  Food Insecurity: No Food Insecurity (05/11/2024)   Hunger Vital Sign    Worried About Running Out of Food in the Last Year: Never true    Ran Out of Food in the Last Year: Never true  Transportation Needs: No Transportation Needs (05/11/2024)   PRAPARE - Administrator, Civil Service (Medical): No    Lack of Transportation (Non-Medical): No  Physical Activity: Sufficiently Active (05/11/2024)   Exercise Vital Sign    Days of Exercise per Week: 5 days    Minutes of Exercise per Session: 50 min  Stress: No Stress Concern Present (05/11/2024)   [employer] of Occupational Health - Occupational Stress Questionnaire    Feeling of Stress: Only a little  Social Connections: Socially Integrated (05/11/2024)   Social Connection and Isolation Panel    Frequency of Communication with Friends and Family: Twice a week    Frequency of Social Gatherings with Friends and Family: Once a week    Attends Religious Services: More than 4 times per year    Active Member of [employer] or Organizations: Yes    Attends Engineer, structural: More than 4 times per year    Marital Status: Married    MEDICATIONS:  Current Outpatient Medications  Medication Sig Dispense Refill   ALPRAZolam  (XANAX ) 0.25 MG tablet TAKE 1 TABLET BY MOUTH TWICE A DAY AS NEEDED FOR ANXIETY 60 tablet 0   aspirin 81 MG tablet Take 81 mg by mouth  daily.     B Complex-C (B-COMPLEX WITH VITAMIN C) tablet Take 1 tablet by mouth daily.     Blood Glucose Monitoring Suppl (ONE TOUCH ULTRA 2) w/Device KIT SMARTSIG:1 Via Meter Every Night     clobetasol  cream (TEMOVATE ) 0.05 % APPLY 1 APPLICATION. TOPICALLY 2 (TWO) TIMES DAILY. (Patient taking differently: Apply 1 application  topically as  needed.) 60 g 6   fenofibrate  (TRICOR ) 145 MG tablet TAKE 1 TABLET BY MOUTH EVERY DAY 90 tablet 3   Lancets (ONETOUCH DELICA PLUS LANCET33G) MISC CHECK GLUCOSE TWICE A DAY 100 each PRN   losartan  (COZAAR ) 25 MG tablet TAKE 1 TABLET (25 MG TOTAL) BY MOUTH DAILY. 90 tablet 3   metoprolol  succinate (TOPROL  XL) 25 MG 24 hr tablet Take 1 tablet (25 mg total) by mouth at bedtime. 90 tablet 3   Omega-3 Fatty Acids (FISH OIL) 1000 MG CAPS Take 1 capsule by mouth daily at 6 (six) AM.     ONETOUCH ULTRA test strip CHECK GLUCOSE TWICE A DAY 100 strip PRN   pantoprazole  (PROTONIX ) 40 MG tablet TAKE 1 TABLET BY MOUTH EVERY DAY 90 tablet 3   pramoxine-hydrocortisone  (PROCTOCREAM-HC) 1-1 % rectal cream Place 1 Application rectally 2 (two) times daily. 30 g 11   rosuvastatin  (CRESTOR ) 5 MG tablet Take 1 tablet (5 mg total) by mouth daily. 90 tablet 3   tirzepatide  (MOUNJARO ) 10 MG/0.5ML Pen Inject 10 mg into the skin once a week. 6 mL 4   No current facility-administered medications for this visit.    PHYSICAL EXAM: Vitals:   06/02/24 0809  BP: 132/62  Pulse: 75  Resp: 20  SpO2: 96%  Weight: 254 lb 12.8 oz (115.6 kg)  Height: 6' 2 (1.88 m)   Body mass index is 32.71 kg/m.  Wt Readings from Last 3 Encounters:  06/02/24 254 lb 12.8 oz (115.6 kg)  05/11/24 253 lb (114.8 kg)  12/16/23 252 lb (114.3 kg)    General: Well developed, well nourished male in no apparent distress.  HEENT: AT/[place], no external lesions.  Eyes: Conjunctiva clear and no icterus. Neck: Neck supple  Lungs: Respirations not labored Neurologic: Alert, oriented, normal speech Extremities /  Skin: Dry.  Psychiatric: Does not appear depressed or anxious  Diabetic Foot Exam - Simple   No data filed    LABS Reviewed Lab Results  Component Value Date   HGBA1C 5.7 (A) 06/02/2024   HGBA1C 6.2 (H) 11/25/2023   HGBA1C 6.2 (H) 09/15/2023   No results found for: FRUCTOSAMINE Lab Results  Component Value Date   CHOL 92 11/25/2023   HDL 23 (L) 11/25/2023   LDLCALC 50 11/25/2023   LDLDIRECT 90.0 06/21/2022   TRIG 106 11/25/2023   CHOLHDL 4.0 11/25/2023   Lab Results  Component Value Date   MICRALBCREAT 3 09/15/2023   MICRALBCREAT 4 08/17/2020   Lab Results  Component Value Date   CREATININE 1.15 11/25/2023   Lab Results  Component Value Date   GFR 64.77 02/03/2023    ASSESSMENT / PLAN  1. Type 2 diabetes mellitus without complication, without long-term current use of insulin (HCC)    Diabetes Mellitus type 2, complicated by no known complications. - Diabetic status / severity: Controlled.  Lab Results  Component Value Date   HGBA1C 5.7 (A) 06/02/2024    - Hemoglobin A1c goal : <7%  - Medications: See below.  Discussed about increasing Mounjaro  to help weight loss benefit.  Patient agreed.  I) increase Mounjaro  from 7.5 to 10 mg weekly.  - Home glucose testing: Few times a week in the morning fasting and at bedtime. - Discussed/ Gave Hypoglycemia treatment plan.  # Consult : not required at this time.   # Annual urine for microalbuminuria/ creatinine ratio, no microalbuminuria currently, continue ACE/ARB /losartan .  Last  Lab Results  Component Value Date   MICRALBCREAT 3 09/15/2023    #  Foot check nightly.  # Annual dilated diabetic eye exams.   - Diet: Make healthy diabetic food choices - Life style / activity / exercise: Discussed.  2. Blood pressure  -  BP Readings from Last 1 Encounters:  06/02/24 132/62    - Control is in target.  - No change in current plans.  3. Lipid status / Hyperlipidemia - Last  Lab Results  Component  Value Date   LDLCALC 50 11/25/2023   - Continue rosuvastatin  5 mg daily.  Fenofibrate  145 mg daily.    Diagnoses and all orders for this visit:  Type 2 diabetes mellitus without complication, without long-term current use of insulin (HCC) -     POCT glycosylated hemoglobin (Hb A1C) -     tirzepatide  (MOUNJARO ) 10 MG/0.5ML Pen; Inject 10 mg into the skin once a week.     DISPOSITION Follow up in clinic in 6 months suggested.   All questions answered and patient verbalized understanding of the plan.  [place] Brailynn Breth, MD [facility] Endocrinology [facility] [address] [place], [address] [place], [place] [address] Phone # [phone]  At least part of this note was generated using voice recognition software. Inadvertent word errors may have occurred, which were not recognized during the proofreading process.

## 2024-06-15 DIAGNOSIS — Z23 Encounter for immunization: Secondary | ICD-10-CM | POA: Diagnosis not present

## 2024-07-16 ENCOUNTER — Other Ambulatory Visit: Payer: Self-pay | Admitting: Internal Medicine

## 2024-07-29 DIAGNOSIS — H2513 Age-related nuclear cataract, bilateral: Secondary | ICD-10-CM | POA: Diagnosis not present

## 2024-07-29 DIAGNOSIS — H18513 Endothelial corneal dystrophy, bilateral: Secondary | ICD-10-CM | POA: Diagnosis not present

## 2024-07-29 DIAGNOSIS — D3132 Benign neoplasm of left choroid: Secondary | ICD-10-CM | POA: Diagnosis not present

## 2024-07-29 DIAGNOSIS — E119 Type 2 diabetes mellitus without complications: Secondary | ICD-10-CM | POA: Diagnosis not present

## 2024-07-29 LAB — HM DIABETES EYE EXAM

## 2024-08-03 ENCOUNTER — Encounter: Payer: Self-pay | Admitting: Internal Medicine

## 2024-09-06 NOTE — Progress Notes (Incomplete)
 "  Annual Comprehensive Physical Exam   Patient Care Team: Beard, Lance PARAS, MD as PCP - General (Internal Medicine) Lance Redell RAMAN, MD as PCP - Cardiology (Cardiology) Livingston Rigg, MD as Consulting Physician (Dermatology)  Visit Date: 09/06/2024   No chief complaint on file.  Subjective:  Patient: Lance Beard, Male DOB: 10/08/1960, 64 y.o. MRN: [id number] There were no vitals filed for this visit. Lance Beard is a 64 y.o. Male who presents today for his Annual Comprehensive Physical Exam. Patient has Hypertriglyceridemia; Essential hypertension, benign; Obesity, unspecified; History of sleep apnea; Impaired glucose tolerance; Obstructive sleep apnea; Fuchs' corneal dystrophy; History of kidney stones; and Type 2 diabetes mellitus with other circulatory complication, without long-term current use of insulin (HCC) on their problem list.  History of Hypertension treated with 25 mg Losartan  daily and 25 mg Metoprolol  tartrate daily. Blood Pressure     History of Type 2 Diabetes Mellitus treated with 7.5 mg Mounjaro  injected weekly. . Was being followed by Lance Sor, MD with Endocrinology, but is now being seen by Lance Thapa, MD. UTD on Diabetes eye exam since 07/17/23 from [facility] with result of Retinopathy   History of Hypertriglyceridemia treated with 145 mg Fenofibrate  daily and 5 mg Rosuvastatin  daily.     History of Gastroesophageal Reflux Disease treated with 40 mg Protonix  daily.   Labs ***/***/*** {Labs (Optional):31667}   10/17/2021 Colonoscopy One 10 mm polyp in the sigmoid colon, removed with a hot snare. Resected and retrieved. Two 3 to 4 mm polyps in the ascending colon, removed with a cold snare. Resected and retrieved. Pathology found to be adenomatous polyps. Diverticulosis in the left colon and in the right colon. The examination was otherwise normal on direct and retroflexion views. Repeat in 3 years.   Health Maintenance  Topic Date Due   COVID-19  Vaccine (5 - 2025-26 season) 04/26/2024   Diabetic kidney evaluation - Urine ACR  09/14/2024   Colonoscopy  10/17/2024   FOOT EXAM  09/18/2024   Diabetic kidney evaluation - eGFR measurement  11/24/2024   HEMOGLOBIN A1C  12/01/2024   OPHTHALMOLOGY EXAM  07/29/2025   DTaP/Tdap/Td (4 - Td or Tdap) 09/30/2033   Pneumococcal Vaccine: 50+ Years  Completed   Influenza Vaccine  Completed   Zoster Vaccines- Shingrix  Completed   Hepatitis B Vaccines 19-59 Average Risk  Aged Out   HPV VACCINES  Aged Out   Meningococcal B Vaccine  Aged Out   Hepatitis C Screening  Discontinued   HIV Screening  Discontinued    {Man or Woman:32389}  Vaccine Counseling: Due for {Vaccines:32291::Influenza}; UTD on {Vaccines:32291::Influenza}  ROS Objective:  Vitals: body mass index is unknown because there is no height or weight on file.There were no vitals filed for this visit. Physical Exam  Current Outpatient Medications  Medication Instructions   ALPRAZolam  (XANAX ) 0.25 MG tablet TAKE 1 TABLET BY MOUTH TWICE A DAY AS NEEDED FOR ANXIETY   aspirin 81 mg, Daily   B Complex-C (B-COMPLEX WITH VITAMIN C) tablet 1 tablet, Daily   Blood Glucose Monitoring Suppl (ONE TOUCH ULTRA 2) w/Device KIT SMARTSIG:1 Via Meter Every Night   clobetasol  cream (TEMOVATE ) 0.05 % 1 application , Topical, 2 times daily   fenofibrate  (TRICOR ) 145 mg, Oral, Daily   Lancets (ONETOUCH DELICA PLUS LANCET33G) MISC CHECK GLUCOSE TWICE A DAY   losartan  (COZAAR ) 25 mg, Oral, Daily   metoprolol  succinate (TOPROL  XL) 25 mg, Oral, Daily at bedtime   Omega-3 Fatty Acids (  FISH OIL) 1000 MG CAPS 1 capsule, Daily   ONETOUCH ULTRA test strip CHECK GLUCOSE TWICE A DAY   pantoprazole  (PROTONIX ) 40 mg, Oral, Daily   pramoxine-hydrocortisone  (PROCTOCREAM-HC) 1-1 % rectal cream 1 Application, Rectal, 2 times daily   rosuvastatin  (CRESTOR ) 5 mg, Oral, Daily   tirzepatide  (MOUNJARO ) 10 MG/0.5ML Pen INJECT 10 MG INTO THE SKIN ONE TIME PER WEEK    Past Medical History:  Diagnosis Date   Chronic kidney disease    kidney stones x2   Diabetes mellitus without complication (HCC)    on meds   GERD (gastroesophageal reflux disease)    on meds   Hx of cardiovascular stress test    a. ETT-MV 3/14:  No scar or ischemia, EF 62%   Hyperlipidemia    on meds   Hypertension    on meds   PONV (postoperative nausea and vomiting)    with one surgery   Sleep apnea    uses CPAP   Medical/Surgical History Narrative:  Allergic/Intolerant to: Allergies[1] *** - ***  *** - ***  *** - ***  *** - ***  *** - ***  *** - ***  *** - ***  *** - *** Other - Hx of: *** ; Surghx of: *** Past Surgical History:  Procedure Laterality Date   APPENDECTOMY     COLONOSCOPY  2019   JP-MAC-suprep(exc)-4 polyps-TA and sessile   NASAL SEPTUM SURGERY     VARICOCELE EXCISION     WISDOM TOOTH EXTRACTION     Family History  Problem Relation Age of Onset   Diabetes Father    Hypertension Father    CAD Father        Approximate age 33    Lung cancer Father    Kidney disease Father    Colon cancer Neg Hx    Esophageal cancer Neg Hx    Stomach cancer Neg Hx    Rectal cancer Neg Hx    Colon polyps Neg Hx    Family History Narrative: {ELFamHX:31110} Social History   Social History Narrative   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   Married. Does not consume alcohol. One daughter who lives in the [place] area.         09/17/22 - He has cut back on work and is not working half time.    Most Recent Health Risks Assessment:   Most Recent Social Determinants of Health (Including Hx of Tobacco, Alcohol, and Drug Use) SDOH Screenings   Food Insecurity: No Food Insecurity (05/11/2024)  Housing: Unknown (05/11/2024)  Transportation Needs: No Transportation Needs (05/11/2024)  Utilities: Not At Risk (09/19/2023)  Depression (PHQ2-9): Low Risk (05/11/2024)   Financial Resource Strain: Low Risk (05/11/2024)  Physical Activity: Sufficiently Active (05/11/2024)  Social Connections: Socially Integrated (05/11/2024)  Stress: No Stress Concern Present (05/11/2024)  Tobacco Use: Low Risk (06/02/2024)  Health Literacy: Adequate Health Literacy (09/19/2023)   Social History[2] Most Recent Functional Status Assessment:     No data to display         Most Recent Fall Risk Assessment:    09/30/2022    2:06 PM  Fall Risk   Falls in the past year? 0  Number falls in past yr: 0  Injury with Fall? 0   Risk for fall due to : No Fall Risks  Follow up Falls prevention discussed     Data saved with a previous flowsheet row definition  Most Recent Anxiety/Depression Screenings:    05/11/2024    3:29 PM 09/30/2022    2:07 PM  PHQ 2/9 Scores  PHQ - 2 Score 0 0       No data to display         Most Recent Cognitive Screening:     No data to display         Most Recent Vision/Hearing Screenings:No results found. Results:  Studies Obtained And Personally Reviewed By Me: Diabetic Foot Exam - Simple   No data filed     {Imaging, colonoscopy, mammogram, bone density scan, echocardiogram, heart cath, stress test, CT calcium  score, etc.:32292}  Labs:  CBC w/ Differential Lab Results  Component Value Date   WBC 6.9 09/15/2023   RBC 5.33 09/15/2023   HGB 17.0 09/15/2023   HCT 50.3 (H) 09/15/2023   PLT 234 09/15/2023   MCV 94.4 09/15/2023   MCH 31.9 09/15/2023   MCHC 33.8 09/15/2023   RDW 12.8 09/15/2023   MPV 10.2 09/15/2023   LYMPHSABS 1,689 09/09/2022   MONOABS 513 04/16/2016   BASOSABS 28 09/15/2023    Comprehensive Metabolic Panel Lab Results  Component Value Date   NA 140 11/25/2023   K 4.4 11/25/2023   CL 102 11/25/2023   CO2 31 11/25/2023   GLUCOSE 107 (H) 11/25/2023   BUN 19 11/25/2023   CREATININE 1.15 11/25/2023   CALCIUM  9.7 11/25/2023   PROT 6.7 09/15/2023   ALBUMIN 4.4 02/03/2023   AST 18 09/15/2023   ALT 34  09/15/2023   ALKPHOS 15 (L) 02/03/2023   BILITOT 0.8 09/15/2023   GFR 64.77 02/03/2023   EGFR 72 11/25/2023   GFRNONAA 63 02/19/2021   Lipid Panel  Lab Results  Component Value Date   CHOL 92 11/25/2023   HDL 23 (L) 11/25/2023   LDLCALC 50 11/25/2023   TRIG 106 11/25/2023   A1c Lab Results  Component Value Date   HGBA1C 5.7 (A) 06/02/2024    TSH Lab Results  Component Value Date   TSH 2.35 12/11/2021   PSA{PSA (Optional):32132} No results found for any visits on 09/20/24. Assessment & Plan:  No orders of the defined types were placed in this encounter.  No orders of the defined types were placed in this encounter.  Other Labs Reviewed today:    No follow-ups on file.   Annual Comprehensive Physical Exam done today including the all of the following: Reviewed patient's Family Medical History Reviewed patient's SDOH and reviewed tobacco, alcohol, and drug use.  Reviewed and updated list of patient's medical providers Assessment of cognitive impairment was done Assessed patient's functional ability Established a written schedule for health screening services Health Risk Assessent Completed and Reviewed  Discussed health benefits of physical activity, and encouraged him to engage in regular exercise appropriate for his age and condition.   I,Makayla C Reid,acting as a scribe for Lance JINNY Hailstone, MD.,have documented all relevant documentation on the behalf of Lance JINNY Hailstone, MD,as directed by  Lance JINNY Hailstone, MD while in the presence of Lance JINNY Hailstone, MD.  I, Lance JINNY Hailstone, MD, have reviewed all documentation for and agree with the above Annual Wellness Visit documentation.  Lance JINNY Hailstone, MD Internal Medicine 09/20/2024     [1] No Known Allergies [2]  Social History Tobacco Use   Smoking status: Never   Smokeless tobacco: Never  Vaping Use   Vaping status: Never Used  Substance Use Topics   Alcohol use: No   Drug  use: No   "

## 2024-09-12 ENCOUNTER — Other Ambulatory Visit: Payer: Self-pay | Admitting: Endocrinology

## 2024-09-12 DIAGNOSIS — E119 Type 2 diabetes mellitus without complications: Secondary | ICD-10-CM

## 2024-09-16 ENCOUNTER — Other Ambulatory Visit: Payer: BC Managed Care – PPO

## 2024-09-16 DIAGNOSIS — E119 Type 2 diabetes mellitus without complications: Secondary | ICD-10-CM

## 2024-09-16 DIAGNOSIS — Z13220 Encounter for screening for lipoid disorders: Secondary | ICD-10-CM

## 2024-09-16 DIAGNOSIS — E781 Pure hyperglyceridemia: Secondary | ICD-10-CM

## 2024-09-16 DIAGNOSIS — Z125 Encounter for screening for malignant neoplasm of prostate: Secondary | ICD-10-CM

## 2024-09-16 DIAGNOSIS — I10 Essential (primary) hypertension: Secondary | ICD-10-CM

## 2024-09-16 DIAGNOSIS — Z0000 Encounter for general adult medical examination without abnormal findings: Secondary | ICD-10-CM

## 2024-09-17 ENCOUNTER — Telehealth: Payer: Self-pay | Admitting: Internal Medicine

## 2024-09-17 LAB — CBC WITH DIFFERENTIAL/PLATELET
Absolute Lymphocytes: 1601 {cells}/uL (ref 850–3900)
Absolute Monocytes: 452 {cells}/uL (ref 200–950)
Basophils Absolute: 52 {cells}/uL (ref 0–200)
Basophils Relative: 0.9 %
Eosinophils Absolute: 41 {cells}/uL (ref 15–500)
Eosinophils Relative: 0.7 %
HCT: 49.6 % (ref 39.4–51.1)
Hemoglobin: 16.8 g/dL (ref 13.2–17.1)
MCH: 30.9 pg (ref 27.0–33.0)
MCHC: 33.9 g/dL (ref 31.6–35.4)
MCV: 91.3 fL (ref 81.4–101.7)
MPV: 10.1 fL (ref 7.5–12.5)
Monocytes Relative: 7.8 %
Neutro Abs: 3654 {cells}/uL (ref 1500–7800)
Neutrophils Relative %: 63 %
Platelets: 267 Thousand/uL (ref 140–400)
RBC: 5.43 Million/uL (ref 4.20–5.80)
RDW: 13.3 % (ref 11.0–15.0)
Total Lymphocyte: 27.6 %
WBC: 5.8 Thousand/uL (ref 3.8–10.8)

## 2024-09-17 LAB — COMPREHENSIVE METABOLIC PANEL WITH GFR
AG Ratio: 2 (calc) (ref 1.0–2.5)
ALT: 36 U/L (ref 9–46)
AST: 20 U/L (ref 10–35)
Albumin: 4.7 g/dL (ref 3.6–5.1)
Alkaline phosphatase (APISO): 17 U/L — ABNORMAL LOW (ref 35–144)
BUN: 17 mg/dL (ref 7–25)
CO2: 30 mmol/L (ref 20–32)
Calcium: 10.1 mg/dL (ref 8.6–10.3)
Chloride: 103 mmol/L (ref 98–110)
Creat: 1.21 mg/dL (ref 0.70–1.35)
Globulin: 2.4 g/dL (ref 1.9–3.7)
Glucose, Bld: 121 mg/dL — ABNORMAL HIGH (ref 65–99)
Potassium: 4.9 mmol/L (ref 3.5–5.3)
Sodium: 141 mmol/L (ref 135–146)
Total Bilirubin: 0.8 mg/dL (ref 0.2–1.2)
Total Protein: 7.1 g/dL (ref 6.1–8.1)
eGFR: 68 mL/min/1.73m2

## 2024-09-17 LAB — LIPID PANEL
Cholesterol: 90 mg/dL
HDL: 30 mg/dL — ABNORMAL LOW
LDL Cholesterol (Calc): 43 mg/dL
Non-HDL Cholesterol (Calc): 60 mg/dL
Total CHOL/HDL Ratio: 3 (calc)
Triglycerides: 87 mg/dL

## 2024-09-17 LAB — HEMOGLOBIN A1C
Hgb A1c MFr Bld: 6.3 % — ABNORMAL HIGH
Mean Plasma Glucose: 134 mg/dL
eAG (mmol/L): 7.4 mmol/L

## 2024-09-17 LAB — PSA: PSA: 1.2 ng/mL

## 2024-09-17 LAB — MICROALBUMIN / CREATININE URINE RATIO
Creatinine, Urine: 96 mg/dL (ref 20–320)
Microalb Creat Ratio: 2 mg/g{creat}
Microalb, Ur: 0.2 mg/dL

## 2024-09-20 ENCOUNTER — Encounter: Payer: BC Managed Care – PPO | Admitting: Internal Medicine

## 2024-09-21 NOTE — Telephone Encounter (Signed)
 Done

## 2024-09-21 NOTE — Progress Notes (Incomplete)
 "  Annual Comprehensive Physical Exam   Patient Care Team: Baxley, Ronal PARAS, MD as PCP - General (Internal Medicine) Pietro Redell RAMAN, MD as PCP - Cardiology (Cardiology) Livingston Rigg, MD as Consulting Physician (Dermatology)  Visit Date: 09/21/24   No chief complaint on file.  Subjective:  Patient: Lance Beard, Male DOB: 1961-04-29, 64 y.o. MRN: [id number] There were no vitals filed for this visit. Lance Beard is a 64 y.o. Male who presents today for his Annual Comprehensive Physical Exam. Patient has Hypertriglyceridemia; Essential hypertension, benign; Obesity, unspecified; History of sleep apnea; Impaired glucose tolerance; Obstructive sleep apnea; Fuchs' corneal dystrophy; History of kidney stones; and Type 2 diabetes mellitus with other circulatory complication, without long-term current use of insulin (HCC) on their problem list.  History of Hypertension treated with 25 mg Losartan  daily and 25 mg Metoprolol  tartrate daily. Blood Pressure     History of Type 2 Diabetes Mellitus treated with 7.5 mg Mounjaro  injected weekly. . Was being followed by Salena Sor, MD with Endocrinology, but is now being seen by Sudan Thapa, MD. UTD on Diabetes eye exam since 07/17/23 from [facility] with result of Retinopathy   History of Hypertriglyceridemia treated with 145 mg Fenofibrate  daily and 5 mg Rosuvastatin  daily.     History of Gastroesophageal Reflux Disease treated with 40 mg Protonix  daily.   Labs ***/***/*** {Labs (Optional):31667}   10/17/2021 Colonoscopy One 10 mm polyp in the sigmoid colon, removed with a hot snare. Resected and retrieved. Two 3 to 4 mm polyps in the ascending colon, removed with a cold snare. Resected and retrieved. Pathology found to be adenomatous polyps. Diverticulosis in the left colon and in the right colon. The examination was otherwise normal on direct and retroflexion views. Repeat in 3 years.   Health Maintenance  Topic Date Due   COVID-19  Vaccine (5 - 2025-26 season) 04/26/2024   FOOT EXAM  09/18/2024   Colonoscopy  10/17/2024   HEMOGLOBIN A1C  03/16/2025   OPHTHALMOLOGY EXAM  07/29/2025   Diabetic kidney evaluation - eGFR measurement  09/16/2025   Diabetic kidney evaluation - Urine ACR  09/16/2025   DTaP/Tdap/Td (4 - Td or Tdap) 09/30/2033   Pneumococcal Vaccine: 50+ Years  Completed   Influenza Vaccine  Completed   HPV VACCINES (No Doses Required) Completed   Zoster Vaccines- Shingrix  Completed   Hepatitis B Vaccines 19-59 Average Risk  Aged Out   Meningococcal B Vaccine  Aged Out   Hepatitis C Screening  Discontinued   HIV Screening  Discontinued    {Man or Woman:32389}  Vaccine Counseling: Due for {Vaccines:32291::Influenza}; UTD on {Vaccines:32291::Influenza}  ROS Objective:  Vitals: body mass index is unknown because there is no height or weight on file.There were no vitals filed for this visit. Physical Exam  Current Outpatient Medications  Medication Instructions   ALPRAZolam  (XANAX ) 0.25 MG tablet TAKE 1 TABLET BY MOUTH TWICE A DAY AS NEEDED FOR ANXIETY   aspirin 81 mg, Daily   B Complex-C (B-COMPLEX WITH VITAMIN C) tablet 1 tablet, Daily   Blood Glucose Monitoring Suppl (ONE TOUCH ULTRA 2) w/Device KIT SMARTSIG:1 Via Meter Every Night   clobetasol  cream (TEMOVATE ) 0.05 % 1 application , Topical, 2 times daily   fenofibrate  (TRICOR ) 145 mg, Oral, Daily   Lancets (ONETOUCH DELICA PLUS LANCET33G) MISC CHECK GLUCOSE TWICE A DAY   losartan  (COZAAR ) 25 mg, Oral, Daily   metoprolol  succinate (TOPROL  XL) 25 mg, Oral, Daily at bedtime   Omega-3 Fatty  Acids (FISH OIL) 1000 MG CAPS 1 capsule, Daily   ONETOUCH ULTRA test strip CHECK GLUCOSE TWICE A DAY   pantoprazole  (PROTONIX ) 40 mg, Oral, Daily   pramoxine-hydrocortisone  (PROCTOCREAM-HC) 1-1 % rectal cream 1 Application, Rectal, 2 times daily   rosuvastatin  (CRESTOR ) 5 mg, Oral, Daily   tirzepatide  (MOUNJARO ) 10 MG/0.5ML Pen INJECT 10 MG INTO THE SKIN  ONE TIME PER WEEK   Past Medical History:  Diagnosis Date   Chronic kidney disease    kidney stones x2   Diabetes mellitus without complication (HCC)    on meds   GERD (gastroesophageal reflux disease)    on meds   Hx of cardiovascular stress test    a. ETT-MV 3/14:  No scar or ischemia, EF 62%   Hyperlipidemia    on meds   Hypertension    on meds   PONV (postoperative nausea and vomiting)    with one surgery   Sleep apnea    uses CPAP   Medical/Surgical History Narrative:  Allergic/Intolerant to: Allergies[1] *** - ***  *** - ***  *** - ***  *** - ***  *** - ***  *** - ***  *** - ***  *** - *** Other - Hx of: *** ; Surghx of: *** Past Surgical History:  Procedure Laterality Date   APPENDECTOMY     COLONOSCOPY  2019   JP-MAC-suprep(exc)-4 polyps-TA and sessile   NASAL SEPTUM SURGERY     VARICOCELE EXCISION     WISDOM TOOTH EXTRACTION     Family History  Problem Relation Age of Onset   Diabetes Father    Hypertension Father    CAD Father        Approximate age 7    Lung cancer Father    Kidney disease Father    Colon cancer Neg Hx    Esophageal cancer Neg Hx    Stomach cancer Neg Hx    Rectal cancer Neg Hx    Colon polyps Neg Hx    Family History Narrative: {ELFamHX:31110} Social History   Social History Narrative   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   Married. Does not consume alcohol. One daughter who lives in the [place] area.         09/17/22 - He has cut back on work and is not working half time.    Most Recent Health Risks Assessment:   Most Recent Social Determinants of Health (Including Hx of Tobacco, Alcohol, and Drug Use) SDOH Screenings   Food Insecurity: No Food Insecurity (09/16/2024)  Housing: Low Risk (09/16/2024)  Transportation Needs: No Transportation Needs (09/16/2024)  Utilities: Not At Risk (09/19/2023)  Depression (PHQ2-9): Low  Risk (05/11/2024)  Financial Resource Strain: Low Risk (09/16/2024)  Physical Activity: Sufficiently Active (09/16/2024)  Social Connections: Socially Integrated (09/16/2024)  Stress: Stress Concern Present (09/16/2024)  Tobacco Use: Low Risk (06/02/2024)  Health Literacy: Adequate Health Literacy (09/19/2023)   Social History[2] Most Recent Functional Status Assessment:     No data to display         Most Recent Fall Risk Assessment:    09/30/2022    2:06 PM  Fall Risk   Falls in the past year? 0  Number falls in past yr: 0  Injury with Fall? 0   Risk for fall due to : No Fall Risks  Follow up Falls prevention discussed     Data saved with a previous flowsheet row definition  Most Recent Anxiety/Depression Screenings:    05/11/2024    3:29 PM 09/30/2022    2:07 PM  PHQ 2/9 Scores  PHQ - 2 Score 0 0       No data to display         Most Recent Cognitive Screening:     No data to display         Most Recent Vision/Hearing Screenings:No results found. Results:  Studies Obtained And Personally Reviewed By Me: Diabetic Foot Exam - Simple   No data filed     {Imaging, colonoscopy, mammogram, bone density scan, echocardiogram, heart cath, stress test, CT calcium  score, etc.:32292}  Labs:  CBC w/ Differential Lab Results  Component Value Date   WBC 5.8 09/16/2024   RBC 5.43 09/16/2024   HGB 16.8 09/16/2024   HCT 49.6 09/16/2024   PLT 267 09/16/2024   MCV 91.3 09/16/2024   MCH 30.9 09/16/2024   MCHC 33.9 09/16/2024   RDW 13.3 09/16/2024   MPV 10.1 09/16/2024   LYMPHSABS 1,689 09/09/2022   MONOABS 513 04/16/2016   BASOSABS 52 09/16/2024    Comprehensive Metabolic Panel Lab Results  Component Value Date   NA 141 09/16/2024   K 4.9 09/16/2024   CL 103 09/16/2024   CO2 30 09/16/2024   GLUCOSE 121 (H) 09/16/2024   BUN 17 09/16/2024   CREATININE 1.21 09/16/2024   CALCIUM  10.1 09/16/2024   PROT 7.1 09/16/2024   ALBUMIN 4.4 02/03/2023   AST 20  09/16/2024   ALT 36 09/16/2024   ALKPHOS 15 (L) 02/03/2023   BILITOT 0.8 09/16/2024   GFR 64.77 02/03/2023   EGFR 68 09/16/2024   GFRNONAA 63 02/19/2021   Lipid Panel  Lab Results  Component Value Date   CHOL 90 09/16/2024   HDL 30 (L) 09/16/2024   LDLCALC 43 09/16/2024   TRIG 87 09/16/2024   A1c Lab Results  Component Value Date   HGBA1C 6.3 (H) 09/16/2024    TSH Lab Results  Component Value Date   TSH 2.35 12/11/2021   PSA{PSA (Optional):32132} No results found for any visits on 10/04/24. Assessment & Plan:  No orders of the defined types were placed in this encounter.  No orders of the defined types were placed in this encounter.  Other Labs Reviewed today:    No follow-ups on file.   Annual Comprehensive Physical Exam done today including the all of the following: Reviewed patient's Family Medical History Reviewed patient's SDOH and reviewed tobacco, alcohol, and drug use.  Reviewed and updated list of patient's medical providers Assessment of cognitive impairment was done Assessed patient's functional ability Established a written schedule for health screening services Health Risk Assessent Completed and Reviewed  Discussed health benefits of physical activity, and encouraged him to engage in regular exercise appropriate for his age and condition.   I,Makayla C Reid,acting as a scribe for Ronal JINNY Hailstone, MD.,have documented all relevant documentation on the behalf of Ronal JINNY Hailstone, MD,as directed by  Ronal JINNY Hailstone, MD while in the presence of Ronal JINNY Hailstone, MD.  I, Ronal JINNY Hailstone, MD, have reviewed all documentation for and agree with the above Annual Wellness Visit documentation.  Ronal JINNY Hailstone, MD Internal Medicine 10/04/2024      [1] No Known Allergies [2]  Social History Tobacco Use   Smoking status: Never   Smokeless tobacco: Never  Vaping Use   Vaping status: Never Used  Substance Use Topics   Alcohol use: No   Drug  use: No   "

## 2024-10-04 ENCOUNTER — Encounter: Admitting: Internal Medicine

## 2024-12-01 ENCOUNTER — Ambulatory Visit: Admitting: Endocrinology
